# Patient Record
Sex: Female | Born: 1965 | Race: White | Hispanic: No | Smoking: Current every day smoker
Health system: Southern US, Community
[De-identification: ages and names within clinical notes are randomized; demographics above are authoritative.]

## PROBLEM LIST (undated history)

## (undated) ENCOUNTER — Emergency Department: Discharge status: Absent without leave | Payer: Medicaid Other

## (undated) DIAGNOSIS — I10 Essential (primary) hypertension: Secondary | ICD-10-CM

## (undated) DIAGNOSIS — Z720 Tobacco use: Secondary | ICD-10-CM

## (undated) DIAGNOSIS — M069 Rheumatoid arthritis, unspecified: Secondary | ICD-10-CM

## (undated) DIAGNOSIS — M1990 Unspecified osteoarthritis, unspecified site: Secondary | ICD-10-CM

## (undated) DIAGNOSIS — M359 Systemic involvement of connective tissue, unspecified: Secondary | ICD-10-CM

## (undated) DIAGNOSIS — IMO0001 Reserved for inherently not codable concepts without codable children: Secondary | ICD-10-CM

## (undated) DIAGNOSIS — Z794 Long term (current) use of insulin: Secondary | ICD-10-CM

## (undated) DIAGNOSIS — F1120 Opioid dependence, uncomplicated: Secondary | ICD-10-CM

## (undated) DIAGNOSIS — G629 Polyneuropathy, unspecified: Secondary | ICD-10-CM

## (undated) DIAGNOSIS — E1142 Type 2 diabetes mellitus with diabetic polyneuropathy: Secondary | ICD-10-CM

## (undated) DIAGNOSIS — G8929 Other chronic pain: Secondary | ICD-10-CM

## (undated) DIAGNOSIS — K760 Fatty (change of) liver, not elsewhere classified: Secondary | ICD-10-CM

## (undated) DIAGNOSIS — E119 Type 2 diabetes mellitus without complications: Secondary | ICD-10-CM

## (undated) HISTORY — PX: CHOLECYSTECTOMY: SHX55

## (undated) HISTORY — PX: KNEE SURGERY: SHX244

## (undated) HISTORY — PX: MANDIBLE FRACTURE SURGERY: SHX706

## (undated) HISTORY — PX: ABDOMINAL HYSTERECTOMY: SHX81

---

## 2012-01-04 ENCOUNTER — Emergency Department: Payer: Self-pay

## 2012-01-27 ENCOUNTER — Emergency Department: Payer: Self-pay | Admitting: Emergency Medicine

## 2012-03-15 ENCOUNTER — Emergency Department: Payer: Self-pay | Admitting: Emergency Medicine

## 2012-03-15 LAB — URINALYSIS, COMPLETE
Bilirubin,UR: NEGATIVE
Blood: NEGATIVE
Glucose,UR: >=500 mg/dL (ref 0–75)
Ketone: NEGATIVE
Leukocyte Esterase: NEGATIVE
Protein: NEGATIVE
RBC,UR: NONE SEEN /HPF (ref 0–5)
Specific Gravity: 1.029 (ref 1.003–1.030)
Squamous Epithelial: <1
WBC UR: 1 /HPF (ref 0–5)

## 2012-03-15 LAB — CBC WITH DIFFERENTIAL/PLATELET
Eosinophil #: 0.2 10*3/uL (ref 0.0–0.7)
Eosinophil %: 2.3 %
HCT: 41.5 % (ref 35.0–47.0)
HGB: 14 g/dL (ref 12.0–16.0)
Lymphocyte #: 3.9 10*3/uL — ABNORMAL HIGH (ref 1.0–3.6)
MCH: 28.5 pg (ref 26.0–34.0)
MCHC: 33.8 g/dL (ref 32.0–36.0)
MCV: 84 fL (ref 80–100)
Monocyte #: 0.5 x10 3/mm (ref 0.2–0.9)
Neutrophil #: 4.1 10*3/uL (ref 1.4–6.5)
Platelet: 158 10*3/uL (ref 150–440)
RDW: 12.9 % (ref 11.5–14.5)

## 2012-03-15 LAB — BASIC METABOLIC PANEL
BUN: 9 mg/dL (ref 7–18)
Calcium, Total: 8.8 mg/dL (ref 8.5–10.1)
Chloride: 101 mmol/L (ref 98–107)
Co2: 33 mmol/L — ABNORMAL HIGH (ref 21–32)
EGFR (African American): >60
Glucose: 374 mg/dL — ABNORMAL HIGH (ref 65–99)
Osmolality: 286 (ref 275–301)
Sodium: 136 mmol/L (ref 136–145)

## 2012-03-15 LAB — TROPONIN I: Troponin-I: <0.02 ng/mL

## 2012-03-15 LAB — CK TOTAL AND CKMB (NOT AT ARMC): CK, Total: 60 U/L (ref 21–215)

## 2012-07-11 ENCOUNTER — Emergency Department: Payer: Self-pay | Admitting: Emergency Medicine

## 2012-08-21 ENCOUNTER — Emergency Department: Payer: Self-pay | Admitting: Unknown Physician Specialty

## 2012-09-08 ENCOUNTER — Emergency Department: Payer: Self-pay | Admitting: Internal Medicine

## 2012-09-08 LAB — COMPREHENSIVE METABOLIC PANEL
Albumin: 3.5 g/dL (ref 3.4–5.0)
Alkaline Phosphatase: 98 U/L (ref 50–136)
Bilirubin,Total: 0.3 mg/dL (ref 0.2–1.0)
Calcium, Total: 8.8 mg/dL (ref 8.5–10.1)
Chloride: 100 mmol/L (ref 98–107)
EGFR (Non-African Amer.): >60
Glucose: 269 mg/dL — ABNORMAL HIGH (ref 65–99)
Osmolality: 281 (ref 275–301)
Potassium: 3.5 mmol/L (ref 3.5–5.1)
SGPT (ALT): 54 U/L (ref 12–78)

## 2012-09-08 LAB — CBC
HGB: 14.1 g/dL (ref 12.0–16.0)
MCH: 28.2 pg (ref 26.0–34.0)
MCHC: 33.1 g/dL (ref 32.0–36.0)
Platelet: 190 10*3/uL (ref 150–440)
WBC: 10 10*3/uL (ref 3.6–11.0)

## 2012-09-15 ENCOUNTER — Emergency Department: Payer: Self-pay | Admitting: Emergency Medicine

## 2012-09-24 ENCOUNTER — Emergency Department: Payer: Self-pay | Admitting: Emergency Medicine

## 2012-09-24 LAB — COMPREHENSIVE METABOLIC PANEL
Albumin: 3.4 g/dL (ref 3.4–5.0)
Alkaline Phosphatase: 120 U/L (ref 50–136)
Anion Gap: 9 (ref 7–16)
Bilirubin,Total: 0.3 mg/dL (ref 0.2–1.0)
Calcium, Total: 8.4 mg/dL — ABNORMAL LOW (ref 8.5–10.1)
Co2: 26 mmol/L (ref 21–32)
Creatinine: 0.62 mg/dL (ref 0.60–1.30)
EGFR (African American): >60
EGFR (Non-African Amer.): >60
Glucose: 338 mg/dL — ABNORMAL HIGH (ref 65–99)
SGOT(AST): 37 U/L (ref 15–37)
SGPT (ALT): 70 U/L (ref 12–78)
Sodium: 135 mmol/L — ABNORMAL LOW (ref 136–145)
Total Protein: 7.2 g/dL (ref 6.4–8.2)

## 2012-09-24 LAB — CBC
HCT: 40.8 % (ref 35.0–47.0)
MCHC: 33 g/dL (ref 32.0–36.0)
MCV: 85 fL (ref 80–100)
WBC: 7.5 10*3/uL (ref 3.6–11.0)

## 2012-09-24 LAB — URINALYSIS, COMPLETE
Bacteria: NONE SEEN
Bilirubin,UR: NEGATIVE
Glucose,UR: >=500 mg/dL (ref 0–75)
Ketone: NEGATIVE
Ph: 5 (ref 4.5–8.0)
Protein: NEGATIVE
RBC,UR: <1 /HPF (ref 0–5)
Specific Gravity: 1.032 (ref 1.003–1.030)
WBC UR: <1 /HPF (ref 0–5)

## 2012-09-24 LAB — CK TOTAL AND CKMB (NOT AT ARMC): CK, Total: 59 U/L (ref 21–215)

## 2012-09-24 LAB — TROPONIN I: Troponin-I: <0.02 ng/mL

## 2012-10-13 ENCOUNTER — Emergency Department: Payer: Self-pay | Admitting: Emergency Medicine

## 2012-10-13 LAB — COMPREHENSIVE METABOLIC PANEL
Albumin: 3.5 g/dL (ref 3.4–5.0)
BUN: 9 mg/dL (ref 7–18)
Co2: 33 mmol/L — ABNORMAL HIGH (ref 21–32)
Creatinine: 0.44 mg/dL — ABNORMAL LOW (ref 0.60–1.30)
EGFR (Non-African Amer.): >60
Glucose: 242 mg/dL — ABNORMAL HIGH (ref 65–99)
Osmolality: 286 (ref 275–301)
SGOT(AST): 39 U/L — ABNORMAL HIGH (ref 15–37)
SGPT (ALT): 68 U/L (ref 12–78)

## 2012-10-13 LAB — URINALYSIS, COMPLETE
Glucose,UR: >=500 mg/dL (ref 0–75)
Ketone: NEGATIVE
Nitrite: NEGATIVE
Protein: NEGATIVE
RBC,UR: <1 /HPF (ref 0–5)
Specific Gravity: 1.026 (ref 1.003–1.030)
Squamous Epithelial: <1
WBC UR: <1 /HPF (ref 0–5)

## 2012-10-13 LAB — CBC
HCT: 39.7 % (ref 35.0–47.0)
HGB: 13.2 g/dL (ref 12.0–16.0)
Platelet: 159 10*3/uL (ref 150–440)
RDW: 14 % (ref 11.5–14.5)
WBC: 8.1 10*3/uL (ref 3.6–11.0)

## 2013-10-08 ENCOUNTER — Emergency Department: Payer: Self-pay | Admitting: Emergency Medicine

## 2013-10-14 ENCOUNTER — Emergency Department: Payer: Self-pay | Admitting: Emergency Medicine

## 2013-10-14 LAB — URINALYSIS, COMPLETE
BACTERIA: NONE SEEN
BILIRUBIN, UR: NEGATIVE
Blood: NEGATIVE
KETONE: NEGATIVE
Leukocyte Esterase: NEGATIVE
NITRITE: NEGATIVE
PH: 5 (ref 4.5–8.0)
Protein: NEGATIVE
RBC,UR: <1 /HPF (ref 0–5)
SPECIFIC GRAVITY: 1.011 (ref 1.003–1.030)
Squamous Epithelial: NONE SEEN
WBC UR: 1 /HPF (ref 0–5)

## 2014-03-05 ENCOUNTER — Emergency Department: Payer: Self-pay | Admitting: Internal Medicine

## 2014-03-05 LAB — URINALYSIS, COMPLETE
BACTERIA: NONE SEEN
Bilirubin,UR: NEGATIVE
Blood: NEGATIVE
Glucose,UR: >=500 mg/dL (ref 0–75)
LEUKOCYTE ESTERASE: NEGATIVE
NITRITE: NEGATIVE
PH: 6 (ref 4.5–8.0)
PROTEIN: NEGATIVE
RBC,UR: <1 /HPF (ref 0–5)
Specific Gravity: 1.031 (ref 1.003–1.030)
Squamous Epithelial: <1
WBC UR: <1 /HPF (ref 0–5)

## 2014-03-05 LAB — COMPREHENSIVE METABOLIC PANEL
ALK PHOS: 88 U/L
ALT: 40 U/L
Albumin: 3.1 g/dL — ABNORMAL LOW (ref 3.4–5.0)
Anion Gap: 14 (ref 7–16)
BUN: 23 mg/dL — AB (ref 7–18)
Bilirubin,Total: 0.9 mg/dL (ref 0.2–1.0)
Calcium, Total: 8.2 mg/dL — ABNORMAL LOW (ref 8.5–10.1)
Chloride: 96 mmol/L — ABNORMAL LOW (ref 98–107)
Co2: 22 mmol/L (ref 21–32)
Creatinine: 0.74 mg/dL (ref 0.60–1.30)
EGFR (African American): >60
EGFR (Non-African Amer.): >60
Glucose: 419 mg/dL — ABNORMAL HIGH (ref 65–99)
Osmolality: 286 (ref 275–301)
POTASSIUM: 3.8 mmol/L (ref 3.5–5.1)
SGOT(AST): 30 U/L (ref 15–37)
SODIUM: 132 mmol/L — AB (ref 136–145)
Total Protein: 6.9 g/dL (ref 6.4–8.2)

## 2014-03-05 LAB — CBC WITH DIFFERENTIAL/PLATELET
Basophil #: 0 10*3/uL (ref 0.0–0.1)
Basophil %: 0.3 %
Eosinophil #: 0 10*3/uL (ref 0.0–0.7)
Eosinophil %: 0 %
HCT: 49.9 % — ABNORMAL HIGH (ref 35.0–47.0)
HGB: 16.1 g/dL — ABNORMAL HIGH (ref 12.0–16.0)
LYMPHS ABS: 1.3 10*3/uL (ref 1.0–3.6)
LYMPHS PCT: 12.3 %
MCH: 28.7 pg (ref 26.0–34.0)
MCHC: 32.3 g/dL (ref 32.0–36.0)
MCV: 89 fL (ref 80–100)
Monocyte #: 0.5 x10 3/mm (ref 0.2–0.9)
Monocyte %: 5.2 %
NEUTROS ABS: 8.3 10*3/uL — AB (ref 1.4–6.5)
Neutrophil %: 82.2 %
Platelet: 216 10*3/uL (ref 150–440)
RBC: 5.63 10*6/uL — AB (ref 3.80–5.20)
RDW: 13.3 % (ref 11.5–14.5)
WBC: 10.1 10*3/uL (ref 3.6–11.0)

## 2014-03-05 LAB — TROPONIN I: Troponin-I: <0.02 ng/mL

## 2014-03-05 LAB — LIPASE, BLOOD: LIPASE: 75 U/L (ref 73–393)

## 2014-03-29 ENCOUNTER — Emergency Department: Payer: Self-pay | Admitting: Emergency Medicine

## 2014-03-29 LAB — CBC
HCT: 41 % (ref 35.0–47.0)
HGB: 13 g/dL (ref 12.0–16.0)
MCH: 28.1 pg (ref 26.0–34.0)
MCHC: 31.8 g/dL — ABNORMAL LOW (ref 32.0–36.0)
MCV: 88 fL (ref 80–100)
Platelet: 171 10*3/uL (ref 150–440)
RBC: 4.64 10*6/uL (ref 3.80–5.20)
RDW: 13 % (ref 11.5–14.5)
WBC: 7.6 10*3/uL (ref 3.6–11.0)

## 2014-03-29 LAB — COMPREHENSIVE METABOLIC PANEL
Albumin: 3.2 g/dL — ABNORMAL LOW (ref 3.4–5.0)
Alkaline Phosphatase: 82 U/L
Anion Gap: 8 (ref 7–16)
BILIRUBIN TOTAL: 0.3 mg/dL (ref 0.2–1.0)
BUN: 8 mg/dL (ref 7–18)
CALCIUM: 8.5 mg/dL (ref 8.5–10.1)
CHLORIDE: 106 mmol/L (ref 98–107)
CO2: 28 mmol/L (ref 21–32)
Creatinine: 0.66 mg/dL (ref 0.60–1.30)
EGFR (African American): >60
EGFR (Non-African Amer.): >60
GLUCOSE: 350 mg/dL — AB (ref 65–99)
OSMOLALITY: 295 (ref 275–301)
Potassium: 3.4 mmol/L — ABNORMAL LOW (ref 3.5–5.1)
SGOT(AST): 29 U/L (ref 15–37)
SGPT (ALT): 32 U/L
Sodium: 142 mmol/L (ref 136–145)
TOTAL PROTEIN: 6.5 g/dL (ref 6.4–8.2)

## 2014-03-29 LAB — URINALYSIS, COMPLETE
BACTERIA: NONE SEEN
Bilirubin,UR: NEGATIVE
Blood: NEGATIVE
Glucose,UR: >=500 mg/dL (ref 0–75)
KETONE: NEGATIVE
NITRITE: NEGATIVE
PROTEIN: NEGATIVE
Ph: 6 (ref 4.5–8.0)
SPECIFIC GRAVITY: 1.012 (ref 1.003–1.030)

## 2014-03-29 LAB — TROPONIN I: Troponin-I: <0.02 ng/mL

## 2014-03-29 LAB — HEMOGLOBIN A1C: HEMOGLOBIN A1C: 10.9 % — AB (ref 4.2–6.3)

## 2014-03-29 LAB — MAGNESIUM: Magnesium: 1.6 mg/dL — ABNORMAL LOW

## 2014-03-29 LAB — LIPASE, BLOOD: Lipase: 124 U/L (ref 73–393)

## 2014-04-23 ENCOUNTER — Emergency Department: Payer: Self-pay | Admitting: Emergency Medicine

## 2015-02-09 ENCOUNTER — Encounter: Payer: Self-pay | Admitting: *Deleted

## 2015-02-09 ENCOUNTER — Emergency Department
Admission: EM | Admit: 2015-02-09 | Discharge: 2015-02-09 | Disposition: A | Discharge status: Home / Self Care | Payer: Self-pay | Attending: Emergency Medicine | Admitting: Emergency Medicine

## 2015-02-09 DIAGNOSIS — Z720 Tobacco use: Secondary | ICD-10-CM | POA: Insufficient documentation

## 2015-02-09 DIAGNOSIS — Z79891 Long term (current) use of opiate analgesic: Secondary | ICD-10-CM | POA: Insufficient documentation

## 2015-02-09 DIAGNOSIS — Z79899 Other long term (current) drug therapy: Secondary | ICD-10-CM | POA: Insufficient documentation

## 2015-02-09 DIAGNOSIS — E119 Type 2 diabetes mellitus without complications: Secondary | ICD-10-CM | POA: Insufficient documentation

## 2015-02-09 DIAGNOSIS — Z794 Long term (current) use of insulin: Secondary | ICD-10-CM | POA: Insufficient documentation

## 2015-02-09 DIAGNOSIS — R252 Cramp and spasm: Secondary | ICD-10-CM | POA: Insufficient documentation

## 2015-02-09 HISTORY — DX: Unspecified osteoarthritis, unspecified site: M19.90

## 2015-02-09 HISTORY — DX: Polyneuropathy, unspecified: G62.9

## 2015-02-09 LAB — BASIC METABOLIC PANEL
ANION GAP: 8 (ref 5–15)
BUN: 15 mg/dL (ref 6–20)
CHLORIDE: 101 mmol/L (ref 101–111)
CO2: 27 mmol/L (ref 22–32)
CREATININE: 0.54 mg/dL (ref 0.44–1.00)
Calcium: 9 mg/dL (ref 8.9–10.3)
GFR calc Af Amer: >60 mL/min (ref >60)
GFR calc non Af Amer: >60 mL/min (ref >60)
Glucose, Bld: 404 mg/dL — ABNORMAL HIGH (ref 65–99)
Potassium: 3.6 mmol/L (ref 3.5–5.1)
Sodium: 136 mmol/L (ref 135–145)

## 2015-02-09 LAB — CK: Total CK: 79 U/L (ref 38–234)

## 2015-02-09 MED ORDER — CYCLOBENZAPRINE HCL 10 MG PO TABS: 10.0000 mg | ORAL_TABLET | Freq: Three times a day (TID) | ORAL | Status: DC | PRN

## 2015-02-09 NOTE — ED Notes (Signed)
Pt reports throbbing and cramping in bilateral legs "off and on" for about 2.5 weeks.

## 2015-02-09 NOTE — ED Notes (Signed)
Patient is supposed to be taking lisinopril, gabapentin and venlafaxine but has been out of them for last few weeks.

## 2015-02-09 NOTE — ED Provider Notes (Signed)
[facility] Emergency Department Provider Note  ____________________________________________  Time seen: On arrival  I have reviewed the triage vital signs and the nursing notes.   HISTORY  Chief Complaint Leg Pain    HPI Karen Lindsey is a 49 y.o. female who presents with multiple complaints. Chief among them is cramping in her bilateral legs. She reports a long history of cramping in her legs but it is never been as bad as it is today. She does not know why this happens. No injury. No fevers no chills. No rash. No recent travel. No history of blood clots.    Past Medical History  Diagnosis Date  . Diabetes mellitus without complication   . Neuropathy   . Arthritis     rheumatoid    There are no active problems to display for this patient.   Past Surgical History  Procedure Laterality Date  . Knee surgery    . Abdominal hysterectomy    . Cesarean section    . Mandible fracture surgery    . Cholecystectomy      Current Outpatient Rx  Name  Route  Sig  Dispense  Refill  . insulin aspart protamine- aspart (NOVOLOG MIX 70/30) (70-30) 100 UNIT/ML injection   Subcutaneous   Inject into the skin.         Marland Kitchen insulin glargine (LANTUS) 100 UNIT/ML injection   Subcutaneous   Inject into the skin at bedtime.         . metFORMIN (GLUCOPHAGE) 1000 MG tablet   Oral   Take 500 mg by mouth 2 (two) times daily with a meal.         . methadone (DOLOPHINE) 5 MG/5ML solution   Oral   Take 130 mg by mouth daily.           Allergies Ultram  No family history on file.  Social History History  Substance Use Topics  . Smoking status: Current Every Day Smoker  . Smokeless tobacco: Not on file  . Alcohol Use: No    Review of Systems  Constitutional: Negative for fever. Eyes: Negative for visual changes. ENT: Negative for sore throat   Genitourinary: Negative for dysuria. Musculoskeletal: Negative for back pain. Skin: Negative for  rash. Neurological: Negative for headaches or focal weakness   ____________________________________________   PHYSICAL EXAM:  VITAL SIGNS: ED Triage Vitals  Enc Vitals Group     BP 02/09/15 1655 157/86 mmHg     Pulse Rate 02/09/15 1655 76     Resp 02/09/15 1655 18     Temp 02/09/15 1655 98.3 F (36.8 C)     Temp Source 02/09/15 1655 Oral     SpO2 02/09/15 1655 95 %     Weight 02/09/15 1655 145 lb (65.772 kg)     Height 02/09/15 1655 5\' 1"  (1.549 m)     Head Cir --      Peak Flow --      Pain Score 02/09/15 1655 6     Pain Loc --      Pain Edu? --      Excl. in GC? --      Constitutional: Alert and oriented. Well appearing and in no distress. Eyes: Conjunctivae are normal.  ENT   Head: Normocephalic and atraumatic.   Mouth/Throat: Mucous membranes are moist. Cardiovascular: Normal rate, regular rhythm.  Respiratory: Normal respiratory effort without tachypnea nor retractions.  Gastrointestinal: Soft and non-tender in all quadrants. No distention. There is no CVA  tenderness. Musculoskeletal: Nontender with normal range of motion in all extremities. Compartments are soft, no rash Neurologic:  Normal speech and language. No gross focal neurologic deficits are appreciated. Skin:  Skin is warm, dry and intact. No rash noted. Psychiatric: Mood and affect are normal. Patient exhibits appropriate insight and judgment.  ____________________________________________    LABS (pertinent positives/negatives)  Labs Reviewed  BASIC METABOLIC PANEL  CK    ____________________________________________     ____________________________________________    RADIOLOGY I have personally reviewed any xrays that were ordered on this patient: None  ____________________________________________   PROCEDURES  Procedure(s) performed: none   ____________________________________________   INITIAL IMPRESSION / ASSESSMENT AND PLAN / ED COURSE  Pertinent labs & imaging  results that were available during my care of the patient were reviewed by me and considered in my medical decision making (see chart for details).  Patient well-appearing no acute distress. Significant anxiety. We will check CK and electrolytes and if normal will discharge with a muscle relaxer. Emphasized patient to take her insulin and to recheck her sugar  ____________________________________________   FINAL CLINICAL IMPRESSION(S) / ED DIAGNOSES  Final diagnoses:  Muscle cramping     Jene Every, MD 02/09/15 1911

## 2015-02-09 NOTE — Discharge Instructions (Signed)

## 2015-02-09 NOTE — ED Notes (Signed)
Patient went roller skating about one month ago and fell. States both legs hurt. States legs began cramping since yesterday. Patient has mulitple complaints from choking on chicken bone from [employer] to moving to having to stand at her job. C/O neuropathy, having been kicked out of her home and financial worries.

## 2015-03-12 ENCOUNTER — Emergency Department
Admission: EM | Admit: 2015-03-12 | Discharge: 2015-03-12 | Disposition: A | Discharge status: Home / Self Care | Payer: Self-pay | Attending: Emergency Medicine | Admitting: Emergency Medicine

## 2015-03-12 DIAGNOSIS — R1011 Right upper quadrant pain: Secondary | ICD-10-CM | POA: Insufficient documentation

## 2015-03-12 DIAGNOSIS — Z720 Tobacco use: Secondary | ICD-10-CM | POA: Insufficient documentation

## 2015-03-12 DIAGNOSIS — E1165 Type 2 diabetes mellitus with hyperglycemia: Secondary | ICD-10-CM | POA: Insufficient documentation

## 2015-03-12 DIAGNOSIS — F419 Anxiety disorder, unspecified: Secondary | ICD-10-CM | POA: Insufficient documentation

## 2015-03-12 DIAGNOSIS — Z79899 Other long term (current) drug therapy: Secondary | ICD-10-CM | POA: Insufficient documentation

## 2015-03-12 DIAGNOSIS — Z794 Long term (current) use of insulin: Secondary | ICD-10-CM | POA: Insufficient documentation

## 2015-03-12 DIAGNOSIS — Z79891 Long term (current) use of opiate analgesic: Secondary | ICD-10-CM | POA: Insufficient documentation

## 2015-03-12 DIAGNOSIS — Z8719 Personal history of other diseases of the digestive system: Secondary | ICD-10-CM | POA: Insufficient documentation

## 2015-03-12 DIAGNOSIS — F439 Reaction to severe stress, unspecified: Secondary | ICD-10-CM | POA: Insufficient documentation

## 2015-03-12 LAB — CBC WITH DIFFERENTIAL/PLATELET
BASOS ABS: 0.1 10*3/uL (ref 0–0.1)
BASOS PCT: 1 %
Eosinophils Absolute: 0.1 10*3/uL (ref 0–0.7)
Eosinophils Relative: 2 %
HCT: 45.6 % (ref 35.0–47.0)
Hemoglobin: 15 g/dL (ref 12.0–16.0)
Lymphocytes Relative: 28 %
Lymphs Abs: 2.6 10*3/uL (ref 1.0–3.6)
MCH: 28.2 pg (ref 26.0–34.0)
MCHC: 32.9 g/dL (ref 32.0–36.0)
MCV: 85.7 fL (ref 80.0–100.0)
MONO ABS: 0.4 10*3/uL (ref 0.2–0.9)
Monocytes Relative: 4 %
NEUTROS ABS: 6 10*3/uL (ref 1.4–6.5)
NEUTROS PCT: 65 %
PLATELETS: 163 10*3/uL (ref 150–440)
RBC: 5.32 MIL/uL — ABNORMAL HIGH (ref 3.80–5.20)
RDW: 13.1 % (ref 11.5–14.5)
WBC: 9.1 10*3/uL (ref 3.6–11.0)

## 2015-03-12 LAB — COMPREHENSIVE METABOLIC PANEL
ALT: 42 U/L (ref 14–54)
ANION GAP: 10 (ref 5–15)
AST: 29 U/L (ref 15–41)
Albumin: 4.1 g/dL (ref 3.5–5.0)
Alkaline Phosphatase: 117 U/L (ref 38–126)
BILIRUBIN TOTAL: 0.8 mg/dL (ref 0.3–1.2)
BUN: 12 mg/dL (ref 6–20)
CALCIUM: 9.2 mg/dL (ref 8.9–10.3)
CO2: 27 mmol/L (ref 22–32)
CREATININE: 0.65 mg/dL (ref 0.44–1.00)
Chloride: 96 mmol/L — ABNORMAL LOW (ref 101–111)
GFR calc non Af Amer: >60 mL/min (ref >60)
Glucose, Bld: 522 mg/dL (ref 65–99)
Potassium: 4.4 mmol/L (ref 3.5–5.1)
SODIUM: 133 mmol/L — AB (ref 135–145)
TOTAL PROTEIN: 7.5 g/dL (ref 6.5–8.1)

## 2015-03-12 LAB — LIPASE, BLOOD: Lipase: 40 U/L (ref 22–51)

## 2015-03-12 LAB — URINALYSIS COMPLETE WITH MICROSCOPIC (ARMC ONLY)
BILIRUBIN URINE: NEGATIVE
Bacteria, UA: NONE SEEN
Glucose, UA: >500 mg/dL — AB
Hgb urine dipstick: NEGATIVE
Ketones, ur: NEGATIVE mg/dL
Leukocytes, UA: NEGATIVE
Nitrite: NEGATIVE
PH: 5 (ref 5.0–8.0)
PROTEIN: NEGATIVE mg/dL
Specific Gravity, Urine: 1.038 — ABNORMAL HIGH (ref 1.005–1.030)

## 2015-03-12 MED ORDER — INSULIN ASPART PROT & ASPART (70-30 MIX) 100 UNIT/ML ~~LOC~~ SUSP
15.0000 [IU] | Freq: Once | SUBCUTANEOUS | Status: AC
  Administered 2015-03-12: 15 [IU] via SUBCUTANEOUS
  Filled 2015-03-12: qty 15
  Filled 2015-03-12: qty 10

## 2015-03-12 MED ORDER — LISINOPRIL 10 MG PO TABS: 10.0000 mg | ORAL_TABLET | Freq: Every day | ORAL | Status: DC

## 2015-03-12 MED ORDER — SODIUM CHLORIDE 0.9 % IV BOLUS (SEPSIS)
1000.0000 mL | Freq: Once | INTRAVENOUS | Status: AC
  Administered 2015-03-12: 1000 mL via INTRAVENOUS

## 2015-03-12 MED ORDER — ALPRAZOLAM 0.5 MG PO TABS: 0.5000 mg | ORAL_TABLET | Freq: Three times a day (TID) | ORAL | Status: DC | PRN

## 2015-03-12 MED ORDER — LORAZEPAM 0.5 MG PO TABS
0.5000 mg | ORAL_TABLET | Freq: Once | ORAL | Status: AC
  Administered 2015-03-12: 0.5 mg via ORAL
  Filled 2015-03-12: qty 1

## 2015-03-12 MED ORDER — LORAZEPAM 0.5 MG PO TABS: 0.5000 mg | ORAL_TABLET | Freq: Three times a day (TID) | ORAL | Status: DC | PRN

## 2015-03-12 NOTE — ED Provider Notes (Signed)
[facility] Emergency Department Provider Note  ____________________________________________  Time seen: 1720  I have reviewed the triage vital signs and the nursing notes.   HISTORY  Chief Complaint Anxiety and Emesis     HPI Karen Lindsey is a 49 y.o. female who reports multiple stressors in her life currently area she outlines how her father has recently been placed in hospice and "we will lightly not make it". 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 She is not sleeping well. She reports feeling nauseous and having some emesis. She has a lack of local support, [place]  The patient does report that she has a history of some liver problems. Sometimes she develops pain in her right abdomen.  She would like to be medically evaluated for the anxiety, distress, abdominal pain, and nausea that she has been undergoing.    Past Medical History  Diagnosis Date  . Diabetes mellitus without complication   . Neuropathy   . Arthritis     rheumatoid    There are no active problems to display for this patient.   Past Surgical History  Procedure Laterality Date  . Knee surgery    . Abdominal hysterectomy    . Cesarean section    . Mandible fracture surgery    . Cholecystectomy      Current Outpatient Rx  Name  Route  Sig  Dispense  Refill  . ALPRAZolam (XANAX) 0.5 MG tablet   Oral   Take 1 tablet (0.5 mg total) by mouth 3 (three) times daily as needed for sleep or anxiety.   15 tablet   0   . cyclobenzaprine (FLEXERIL) 10 MG tablet   Oral   Take 1 tablet (10 mg total) by mouth every 8 (eight) hours as needed for muscle spasms.   20 tablet   1   . insulin aspart protamine- aspart (NOVOLOG MIX 70/30) (70-30) 100 UNIT/ML injection   Subcutaneous   Inject into the skin.         Marland Kitchen insulin glargine (LANTUS) 100 UNIT/ML injection   Subcutaneous   Inject into the skin  at bedtime.         Marland Kitchen lisinopril (PRINIVIL,ZESTRIL) 10 MG tablet   Oral   Take 1 tablet (10 mg total) by mouth daily.   30 tablet   0   . metFORMIN (GLUCOPHAGE) 1000 MG tablet   Oral   Take 500 mg by mouth 2 (two) times daily with a meal.         . methadone (DOLOPHINE) 5 MG/5ML solution   Oral   Take 130 mg by mouth daily.           Allergies Ultram  No family history on file.  Social History Social History  Substance Use Topics  . Smoking status: Current Every Day Smoker  . Smokeless tobacco: Not on file  . Alcohol Use: No    Review of Systems  Constitutional: Negative for fever. ENT: Negative for sore throat. Cardiovascular: Negative for chest pain. Respiratory: Negative for shortness of breath. Gastrointestinal: History of enlarged liver. Recent right abdominal pain with some nausea. Genitourinary: Negative for dysuria. Musculoskeletal: No myalgias or injuries. Skin: Negative for rash. Neurological: Negative for headaches Psychological: Multiple stressors with anxiety recently. See history of present illness  10-point ROS otherwise negative.  ____________________________________________   PHYSICAL EXAM:  VITAL SIGNS: ED Triage Vitals  Enc Vitals Group     BP 03/12/15 1705 158/85 mmHg     Pulse Rate 03/12/15 1521 68     Resp 03/12/15 1521 18     Temp 03/12/15 1521 98.6 F (37 C)     Temp Source 03/12/15 1521 Oral     SpO2 03/12/15 1521 95 %     Weight 03/12/15 1521 140 lb (63.504 kg)     Height 03/12/15 1521 5\' 1"  (1.549 m)     Head Cir --      Peak Flow --      Pain Score 03/12/15 1522 6     Pain Loc --      Pain Edu? --      Excl. in GC? --     Constitutional: Alert and oriented. Patient appears to be somewhat anxious. She is somewhat hyperverbal. She is in no acute distress.Marland Kitchen ENT   Head: Normocephalic and atraumatic.   Nose: No congestion/rhinnorhea.   Mouth/Throat: Mucous membranes are moist. Cardiovascular: Normal  rate, regular rhythm, no murmur noted Respiratory:  Normal respiratory effort, no tachypnea.    Breath sounds are clear and equal bilaterally.  Gastrointestinal: Soft, mild tenderness in the right upper quadrant. Normal bowel sounds. Back: No muscle spasm, no tenderness, no CVA tenderness. Musculoskeletal: No deformity noted. Nontender with normal range of motion in all extremities.  No noted edema. Neurologic:  Normal speech and language. No gross focal neurologic deficits are appreciated.  Skin:  Skin is warm, dry. No rash noted. Psychiatric: Patient goes into great detail about the multiple stressors in her life. As noted in history of present illness, her father is in hospice, she has a stressful job, she has a lack of local support. She has an intact thought process. She denies any suicidal thoughts or thoughts of hurting others..  ____________________________________________    LABS (pertinent positives/negatives)  Labs Reviewed  COMPREHENSIVE METABOLIC PANEL - Abnormal; Notable for the following:    Sodium 133 (*)    Chloride 96 (*)    Glucose, Bld 522 (*)    All other components within normal limits  CBC WITH DIFFERENTIAL/PLATELET - Abnormal; Notable for the following:    RBC 5.32 (*)    All other components within normal limits  URINALYSIS COMPLETEWITH MICROSCOPIC ([facility] ONLY) - Abnormal; Notable for the following:    Color, Urine STRAW (*)    APPearance CLEAR (*)    Glucose, UA >500 (*)    Specific Gravity, Urine 1.038 (*)    Squamous Epithelial / LPF 0-5 (*)    All other components within normal limits  LIPASE, BLOOD   ____________________________________________   INITIAL IMPRESSION / ASSESSMENT AND PLAN / ED COURSE  Pertinent labs & imaging results that were available during my care of the patient were reviewed by me and considered in my medical decision making (see chart for details).  This patient has multiple stressors. She implies that she thinks anxiety is  causing her to feel nauseous and vomit. She liked to be medically evaluated. I think this sounds quite reasonable. Due to the right-sided abdominal pain and general malaise, we are checking blood tests, including liver enzymes, electrolytes, kidney function, and blood count. We'll check urinalysis as well.  ----------------------------------------- 7:04 PM on 03/12/2015 -----------------------------------------  Blood count is reasonable with a normal white blood cell count and hemoglobin 15. Renal function is good, however glucose is elevated at 522.  Further discussion with the patient about her diabetes medication regimen finds that she is  erratic about taking her medication. She usually takes her Lantus at around 1 AM. At that time she also takes a dose of 7030. I raised the question of why she would be taking 7030 before bed with her and I think encouraged her to speak with the doctor she has seen previously for further guidance. I think would be more reasonable for her to take 7030 during the day and rely on the Lantus at night for long-term stabilization. We've also discussed the option of her taking Lantus at a lower dose twice a day. The patient says she used to do this. She seems to have her own personal preference for how she takes her insulin.  We will treat her with 1 L of normal saline and 70/30 at the moment.  ----------------------------------------- 8:43 PM on 03/12/2015 -----------------------------------------  The patient's blood sugar has come down to 293. She overall feels better. We have discussed the importance of follow-up both for the hyperglycemia as well as for her anxiety. I initially was prescribing Xanax for her, but she prefers Ativan. She has also asked that I knew her lisinopril. I have written a prescription for both of these medications.   ____________________________________________   FINAL CLINICAL IMPRESSION(S) / ED DIAGNOSES  Final diagnoses:  Right  upper quadrant pain  Anxiety  Hyperglycemia due to type 2 diabetes mellitus      Darien Ramus, MD 03/12/15 2044

## 2015-03-12 NOTE — Discharge Instructions (Signed)
Your blood tests appeared okay. You are under a great deal of stress. Take Xanax as needed. Follow-up with your regular doctor or at [facility] clinic for medical problems. Follow-up with a therapist or psychologist or psychiatrist. He may go to [facility] for additional assistance. Return to the emergency department if you have worsening abdominal pain, nausea vomiting diarrhea, or if you have increased agitation or concerns from the multiple stressors you are experiencing and had thoughts of self-harm or other urgent concerns.

## 2015-03-12 NOTE — ED Notes (Signed)
Pt verbalizes understanding and denies questions at time of d/c.  No signature pad available in pts room to sign d/c.

## 2015-03-12 NOTE — ED Notes (Signed)
Insulin requested from pharmacy.

## 2015-03-12 NOTE — ED Notes (Signed)
Patient presents to the ED with anxiety and nausea.  Patient states she learned that her father is dying and is in hospice four days ago and patient states that since then she has had no appetite and had anxiety.  Patient reports vomiting x 1 yesterday.  Denies vomiting today.  Denies abdominal pain.

## 2015-03-12 NOTE — ED Notes (Signed)
PTs Fingerstick BGL - 293

## 2015-03-12 NOTE — ED Notes (Signed)
Patient states "I haven't been able to sleep or eat well the past couple days.  I have a lot going on at home with my personal life".  Patient denies the use of any anxiety meds at homes and denies SI or HI.

## 2015-03-14 LAB — GLUCOSE, CAPILLARY: Glucose-Capillary: 293 mg/dL — ABNORMAL HIGH (ref 65–99)

## 2015-03-16 ENCOUNTER — Emergency Department: Payer: Self-pay

## 2015-03-16 DIAGNOSIS — Z79891 Long term (current) use of opiate analgesic: Secondary | ICD-10-CM | POA: Insufficient documentation

## 2015-03-16 DIAGNOSIS — Z79899 Other long term (current) drug therapy: Secondary | ICD-10-CM | POA: Insufficient documentation

## 2015-03-16 DIAGNOSIS — Z720 Tobacco use: Secondary | ICD-10-CM | POA: Insufficient documentation

## 2015-03-16 DIAGNOSIS — E119 Type 2 diabetes mellitus without complications: Secondary | ICD-10-CM | POA: Insufficient documentation

## 2015-03-16 DIAGNOSIS — L03113 Cellulitis of right upper limb: Secondary | ICD-10-CM | POA: Insufficient documentation

## 2015-03-16 DIAGNOSIS — Z794 Long term (current) use of insulin: Secondary | ICD-10-CM | POA: Insufficient documentation

## 2015-03-16 NOTE — ED Notes (Signed)
Pt states that she fell asleep on the side of the bed last night and fell into the floor, pt states that she slept that way all night and this am woke up with redness and pain to the right hand, pt states that it has been painful all day

## 2015-03-17 ENCOUNTER — Emergency Department
Admission: EM | Admit: 2015-03-17 | Discharge: 2015-03-17 | Disposition: A | Discharge status: Home / Self Care | Payer: Self-pay | Attending: Emergency Medicine | Admitting: Emergency Medicine

## 2015-03-17 DIAGNOSIS — L03113 Cellulitis of right upper limb: Secondary | ICD-10-CM

## 2015-03-17 MED ORDER — KETOROLAC TROMETHAMINE 10 MG PO TABS
10.0000 mg | ORAL_TABLET | Freq: Once | ORAL | Status: AC
  Administered 2015-03-17: 10 mg via ORAL
  Filled 2015-03-17: qty 1

## 2015-03-17 MED ORDER — KETOROLAC TROMETHAMINE 10 MG PO TABS: 10.0000 mg | ORAL_TABLET | Freq: Three times a day (TID) | ORAL | Status: DC | PRN

## 2015-03-17 MED ORDER — CEPHALEXIN 500 MG PO CAPS: 500.0000 mg | ORAL_CAPSULE | Freq: Two times a day (BID) | ORAL | Status: AC

## 2015-03-17 MED ORDER — CEPHALEXIN 500 MG PO CAPS
500.0000 mg | ORAL_CAPSULE | Freq: Once | ORAL | Status: AC
  Administered 2015-03-17: 500 mg via ORAL
  Filled 2015-03-17: qty 1

## 2015-03-17 NOTE — ED Notes (Signed)
Patient presents to ED with c/o swelling and redness to right hand since yesterday morning at 5:00 am. Patient reports she was sitting on the side of the bed, fell asleep and woke up on the floor with her right hand on top of the bed. Patient denies any known injury to right hand. Redness and swelling noted to right hand beginning at fingertips to mid forearm. Patient reports tingling and numbness, states unable to make a fist due to increased pain. Right hand cool to touch, + radial pulse, capillary refill WNL. Patient alert and oriented x 4, respirations even and unlabored. NAD noted at this time.

## 2015-03-17 NOTE — Discharge Instructions (Signed)

## 2015-03-17 NOTE — ED Notes (Signed)

## 2015-03-17 NOTE — ED Provider Notes (Signed)
[facility] Emergency Department Provider Note  ____________________________________________  Time seen: 2:00 AM  I have reviewed the triage vital signs and the nursing notes.   HISTORY  Chief Complaint Hand Pain      HPI Karen Lindsey is a 49 y.o. female presents with nontraumatic right hand pain and swelling and redness first noted one day ago. Patient states that she fell asleep and woke up with her hand swollen painful with erythema. Current pain score 6 out of 10     Past Medical History  Diagnosis Date  . Diabetes mellitus without complication   . Neuropathy   . Arthritis     rheumatoid    There are no active problems to display for this patient.   Past Surgical History  Procedure Laterality Date  . Knee surgery    . Abdominal hysterectomy    . Cesarean section    . Mandible fracture surgery    . Cholecystectomy      Current Outpatient Rx  Name  Route  Sig  Dispense  Refill  . cyclobenzaprine (FLEXERIL) 10 MG tablet   Oral   Take 1 tablet (10 mg total) by mouth every 8 (eight) hours as needed for muscle spasms.   20 tablet   1   . insulin aspart protamine- aspart (NOVOLOG MIX 70/30) (70-30) 100 UNIT/ML injection   Subcutaneous   Inject into the skin.         Marland Kitchen insulin glargine (LANTUS) 100 UNIT/ML injection   Subcutaneous   Inject into the skin at bedtime.         Marland Kitchen lisinopril (PRINIVIL,ZESTRIL) 10 MG tablet   Oral   Take 1 tablet (10 mg total) by mouth daily.   30 tablet   0   . LORazepam (ATIVAN) 0.5 MG tablet   Oral   Take 1 tablet (0.5 mg total) by mouth every 8 (eight) hours as needed for anxiety.   10 tablet   0   . metFORMIN (GLUCOPHAGE) 1000 MG tablet   Oral   Take 500 mg by mouth 2 (two) times daily with a meal.         . methadone (DOLOPHINE) 5 MG/5ML solution   Oral   Take 130 mg by mouth daily.           Allergies Ultram  No family history on file.  Social History Social History   Substance Use Topics  . Smoking status: Current Every Day Smoker  . Smokeless tobacco: Not on file  . Alcohol Use: No    Review of Systems  Constitutional: Negative for fever. Eyes: Negative for visual changes. ENT: Negative for sore throat. Cardiovascular: Negative for chest pain. Respiratory: Negative for shortness of breath. Gastrointestinal: Negative for abdominal pain, vomiting and diarrhea. Genitourinary: Negative for dysuria. Musculoskeletal: Negative for back pain. Positive right hand pain and swelling redness Skin: Negative for rash. Neurological: Negative for headaches, focal weakness or numbness.  10-point ROS otherwise negative.  ____________________________________________   PHYSICAL EXAM:  VITAL SIGNS: ED Triage Vitals  Enc Vitals Group     BP 03/16/15 2205 131/76 mmHg     Pulse Rate 03/16/15 2205 73     Resp 03/16/15 2205 18     Temp 03/16/15 2205 98.2 F (36.8 C)     Temp Source 03/16/15 2205 Oral     SpO2 03/16/15 2205 99 %     Weight 03/16/15 2205 140 lb (63.504 kg)     Height 03/16/15 2205  5\' 1"  (1.549 m)     Head Cir --      Peak Flow --      Pain Score 03/16/15 2206 6     Pain Loc --      Pain Edu? --      Excl. in GC? --     Constitutional: Alert and oriented. Well appearing and in no distress. Eyes: Conjunctivae are normal. PERRL. Normal extraocular movements. ENT   Head: Normocephalic and atraumatic.   Nose: No congestion/rhinnorhea.   Mouth/Throat: Mucous membranes are moist.   Neck: No stridor. Cardiovascular: Normal rate, regular rhythm. Normal and symmetric distal pulses are present in all extremities. No murmurs, rubs, or gallops. Respiratory: Normal respiratory effort without tachypnea nor retractions. Breath sounds are clear and equal bilaterally. No wheezes/rales/rhonchi. Gastrointestinal: Soft and nontender. No distention. There is no CVA tenderness. Genitourinary: deferred Musculoskeletal: Nontender with normal  range of motion in all extremities. No joint effusions.  No lower extremity tenderness nor edema. Neurologic:  Normal speech and language. No gross focal neurologic deficits are appreciated. Speech is normal.  Skin:  Right hand pain swelling and erythema on the dorsal aspect.[id number] Psychiatric: Mood and affect are normal. Speech and behavior are normal. Patient exhibits appropriate insight and judgment.  Radiology:    DG Hand Complete Right (Final result) Result time: 03/16/15 23:20:39   Final result by Rad Results In Interface (03/16/15 23:20:39)   Narrative:   CLINICAL DATA: Patient fell out of bed at 5:30 this morning. Pain and redness of the right hand since then.  EXAM: RIGHT HAND - COMPLETE 3+ VIEW  COMPARISON: None.  FINDINGS: There is no evidence of fracture or dislocation. There is no evidence of arthropathy or other focal bone abnormality. Soft tissues are unremarkable.  IMPRESSION: Negative.   Electronically Signed By: 05/16/15 M.D. On: 03/16/2015 23:20     INITIAL IMPRESSION / ASSESSMENT AND PLAN / ED COURSE  Pertinent labs & imaging results that were available during my care of the patient were reviewed by me and considered in my medical decision making (see chart for details).  History of physical exam concerning for possible cellulitis of the posterior aspect of the right hand. As such patient received Keflex 500 mg Toradol 10 mg by mouth for pain  ____________________________________________   FINAL CLINICAL IMPRESSION(S) / ED DIAGNOSES  Final diagnoses:  Cellulitis of right hand      05/16/2015, MD 03/17/15 [phone]

## 2015-04-28 ENCOUNTER — Emergency Department
Admission: EM | Admit: 2015-04-28 | Discharge: 2015-04-28 | Disposition: A | Discharge status: Home / Self Care | Payer: Self-pay | Attending: Emergency Medicine | Admitting: Emergency Medicine

## 2015-04-28 DIAGNOSIS — Z79891 Long term (current) use of opiate analgesic: Secondary | ICD-10-CM | POA: Insufficient documentation

## 2015-04-28 DIAGNOSIS — Z79899 Other long term (current) drug therapy: Secondary | ICD-10-CM | POA: Insufficient documentation

## 2015-04-28 DIAGNOSIS — E119 Type 2 diabetes mellitus without complications: Secondary | ICD-10-CM | POA: Insufficient documentation

## 2015-04-28 DIAGNOSIS — Z794 Long term (current) use of insulin: Secondary | ICD-10-CM | POA: Insufficient documentation

## 2015-04-28 DIAGNOSIS — Z7984 Long term (current) use of oral hypoglycemic drugs: Secondary | ICD-10-CM | POA: Insufficient documentation

## 2015-04-28 DIAGNOSIS — Z720 Tobacco use: Secondary | ICD-10-CM | POA: Insufficient documentation

## 2015-04-28 DIAGNOSIS — J0100 Acute maxillary sinusitis, unspecified: Secondary | ICD-10-CM | POA: Insufficient documentation

## 2015-04-28 DIAGNOSIS — J209 Acute bronchitis, unspecified: Secondary | ICD-10-CM | POA: Insufficient documentation

## 2015-04-28 MED ORDER — BENZONATATE 100 MG PO CAPS
200.0000 mg | ORAL_CAPSULE | Freq: Once | ORAL | Status: AC
  Administered 2015-04-28: 200 mg via ORAL
  Filled 2015-04-28: qty 2

## 2015-04-28 MED ORDER — BENZONATATE 200 MG PO CAPS: 200.0000 mg | ORAL_CAPSULE | Freq: Three times a day (TID) | ORAL | Status: DC | PRN

## 2015-04-28 MED ORDER — AZITHROMYCIN 250 MG PO TABS
500.0000 mg | ORAL_TABLET | Freq: Once | ORAL | Status: AC
  Administered 2015-04-28: 500 mg via ORAL
  Filled 2015-04-28: qty 2

## 2015-04-28 MED ORDER — AZITHROMYCIN 250 MG PO TABS: 500.0000 mg | ORAL_TABLET | Freq: Every day | ORAL | Status: AC

## 2015-04-28 NOTE — ED Notes (Addendum)
Patient ambulatory to triage with steady gait, without difficulty or distress noted; pt reports since yesterday having fever, nonproductive cough, sinus congestion and frontal HA and difficulty hearing out of ears

## 2015-04-28 NOTE — Discharge Instructions (Signed)
Acute Bronchitis Bronchitis is inflammation of the airways that extend from the windpipe into the lungs (bronchi). The inflammation often causes mucus to develop. This leads to a cough, which is the most common symptom of bronchitis.  In acute bronchitis, the condition usually develops suddenly and goes away over time, usually in a couple weeks. Smoking, allergies, and asthma can make bronchitis worse. Repeated episodes of bronchitis may cause further lung problems.  CAUSES Acute bronchitis is most often caused by the same virus that causes a cold. The virus can spread from person to person (contagious) through coughing, sneezing, and touching contaminated objects. SIGNS AND SYMPTOMS   Cough.   Fever.   Coughing up mucus.   Body aches.   Chest congestion.   Chills.   Shortness of breath.   Sore throat.  DIAGNOSIS  Acute bronchitis is usually diagnosed through a physical exam. Your health care provider will also ask you questions about your medical history. Tests, such as chest X-rays, are sometimes done to rule out other conditions.  TREATMENT  Acute bronchitis usually goes away in a couple weeks. Oftentimes, no medical treatment is necessary. Medicines are sometimes given for relief of fever or cough. Antibiotic medicines are usually not needed but may be prescribed in certain situations. In some cases, an inhaler may be recommended to help reduce shortness of breath and control the cough. A cool mist vaporizer may also be used to help thin bronchial secretions and make it easier to clear the chest.  HOME CARE INSTRUCTIONS  Get plenty of rest.   Drink enough fluids to keep your urine clear or pale yellow (unless you have a medical condition that requires fluid restriction). Increasing fluids may help thin your respiratory secretions (sputum) and reduce chest congestion, and it will prevent dehydration.   Take medicines only as directed by your health care provider.  If  you were prescribed an antibiotic medicine, finish it all even if you start to feel better.  Avoid smoking and secondhand smoke. Exposure to cigarette smoke or irritating chemicals will make bronchitis worse. If you are a smoker, consider using nicotine gum or skin patches to help control withdrawal symptoms. Quitting smoking will help your lungs heal faster.   Reduce the chances of another bout of acute bronchitis by washing your hands frequently, avoiding people with cold symptoms, and trying not to touch your hands to your mouth, nose, or eyes.   Keep all follow-up visits as directed by your health care provider.  SEEK MEDICAL CARE IF: Your symptoms do not improve after 1 week of treatment.  SEEK IMMEDIATE MEDICAL CARE IF:  You develop an increased fever or chills.   You have chest pain.   You have severe shortness of breath.  You have bloody sputum.   You develop dehydration.  You faint or repeatedly feel like you are going to pass out.  You develop repeated vomiting.  You develop a severe headache. MAKE SURE YOU:   Understand these instructions.  Will watch your condition.  Will get help right away if you are not doing well or get worse.   This information is not intended to replace advice given to you by your health care provider. Make sure you discuss any questions you have with your health care provider.   Document Released: 08/04/2004 Document Revised: 07/18/2014 Document Reviewed: 12/18/2012 Elsevier Interactive Patient Education 2016 [employer] Sinusitis, Adult Sinusitis is redness, soreness, and inflammation of the paranasal sinuses. Paranasal sinuses are air pockets within the   bones of your face. They are located beneath your eyes, in the middle of your forehead, and above your eyes. In healthy paranasal sinuses, mucus is able to drain out, and air is able to circulate through them by way of your nose. However, when your paranasal sinuses are inflamed,  mucus and air can become trapped. This can allow bacteria and other germs to grow and cause infection. Sinusitis can develop quickly and last only a short time (acute) or continue over a long period (chronic). Sinusitis that lasts for more than 12 weeks is considered chronic. CAUSES Causes of sinusitis include:  Allergies.  Structural abnormalities, such as displacement of the cartilage that separates your nostrils (deviated septum), which can decrease the air flow through your nose and sinuses and affect sinus drainage.  Functional abnormalities, such as when the small hairs (cilia) that line your sinuses and help remove mucus do not work properly or are not present. SIGNS AND SYMPTOMS Symptoms of acute and chronic sinusitis are the same. The primary symptoms are pain and pressure around the affected sinuses. Other symptoms include:  Upper toothache.  Earache.  Headache.  Bad breath.  Decreased sense of smell and taste.  A cough, which worsens when you are lying flat.  Fatigue.  Fever.  Thick drainage from your nose, which often is green and may contain pus (purulent).  Swelling and warmth over the affected sinuses. DIAGNOSIS Your health care provider will perform a physical exam. During your exam, your health care provider may perform any of the following to help determine if you have acute sinusitis or chronic sinusitis:  Look in your nose for signs of abnormal growths in your nostrils (nasal polyps).  Tap over the affected sinus to check for signs of infection.  View the inside of your sinuses using an imaging device that has a light attached (endoscope). If your health care provider suspects that you have chronic sinusitis, one or more of the following tests may be recommended:  Allergy tests.  Nasal culture. A sample of mucus is taken from your nose, sent to a lab, and screened for bacteria.  Nasal cytology. A sample of mucus is taken from your nose and examined by  your health care provider to determine if your sinusitis is related to an allergy. TREATMENT Most cases of acute sinusitis are related to a viral infection and will resolve on their own within 10 days. Sometimes, medicines are prescribed to help relieve symptoms of both acute and chronic sinusitis. These may include pain medicines, decongestants, nasal steroid sprays, or saline sprays. However, for sinusitis related to a bacterial infection, your health care provider will prescribe antibiotic medicines. These are medicines that will help kill the bacteria causing the infection. Rarely, sinusitis is caused by a fungal infection. In these cases, your health care provider will prescribe antifungal medicine. For some cases of chronic sinusitis, surgery is needed. Generally, these are cases in which sinusitis recurs more than 3 times per year, despite other treatments. HOME CARE INSTRUCTIONS  Drink plenty of water. Water helps thin the mucus so your sinuses can drain more easily.  Use a humidifier.  Inhale steam 3-4 times a day (for example, sit in the bathroom with the shower running).  Apply a warm, moist washcloth to your face 3-4 times a day, or as directed by your health care provider.  Use saline nasal sprays to help moisten and clean your sinuses.  Take medicines only as directed by your health care provider.  If   you were prescribed either an antibiotic or antifungal medicine, finish it all even if you start to feel better. SEEK IMMEDIATE MEDICAL CARE IF:  You have increasing pain or severe headaches.  You have nausea, vomiting, or drowsiness.  You have swelling around your face.  You have vision problems.  You have a stiff neck.  You have difficulty breathing.   This information is not intended to replace advice given to you by your health care provider. Make sure you discuss any questions you have with your health care provider.   Document Released: 06/27/2005 Document  Revised: 07/18/2014 Document Reviewed: 07/12/2011 Elsevier Interactive Patient Education 2016 [employer] Inc[employer]  

## 2015-04-28 NOTE — ED Notes (Signed)
Pt also with H/A and sinus congestion.

## 2015-04-28 NOTE — ED Provider Notes (Signed)
[facility] Emergency Department Provider Note  ____________________________________________  Time seen: 3:45 AM  I have reviewed the triage vital signs and the nursing notes.   HISTORY  Chief Complaint Nasal Congestion; Cough; and Headache      HPI Karen Lindsey is a 49 y.o. female presents with cough congestion and frontal headache and bilateral decreased hearing 2 days. Patient also admits to chills. Patient admits to 2 sick contacts with similar symptoms. Patient denies any alleviating or aggravating factors.    Past Medical History  Diagnosis Date  . Diabetes mellitus without complication   . Neuropathy   . Arthritis     rheumatoid    There are no active problems to display for this patient.   Past Surgical History  Procedure Laterality Date  . Knee surgery    . Abdominal hysterectomy    . Cesarean section    . Mandible fracture surgery    . Cholecystectomy      Current Outpatient Rx  Name  Route  Sig  Dispense  Refill  . cyclobenzaprine (FLEXERIL) 10 MG tablet   Oral   Take 1 tablet (10 mg total) by mouth every 8 (eight) hours as needed for muscle spasms.   20 tablet   1   . insulin aspart protamine- aspart (NOVOLOG MIX 70/30) (70-30) 100 UNIT/ML injection   Subcutaneous   Inject into the skin.         Marland Kitchen insulin glargine (LANTUS) 100 UNIT/ML injection   Subcutaneous   Inject into the skin at bedtime.         Marland Kitchen ketorolac (TORADOL) 10 MG tablet   Oral   Take 1 tablet (10 mg total) by mouth every 8 (eight) hours as needed.   20 tablet   0   . lisinopril (PRINIVIL,ZESTRIL) 10 MG tablet   Oral   Take 1 tablet (10 mg total) by mouth daily.   30 tablet   0   . LORazepam (ATIVAN) 0.5 MG tablet   Oral   Take 1 tablet (0.5 mg total) by mouth every 8 (eight) hours as needed for anxiety.   10 tablet   0   . metFORMIN (GLUCOPHAGE) 1000 MG tablet   Oral   Take 500 mg by mouth 2 (two) times daily with a meal.          . methadone (DOLOPHINE) 5 MG/5ML solution   Oral   Take 130 mg by mouth daily.           Allergies Ultram  No family history on file.  Social History Social History  Substance Use Topics  . Smoking status: Current Every Day Smoker  . Smokeless tobacco: Not on file  . Alcohol Use: No    Review of Systems  Constitutional: Negative for fever. Eyes: Negative for visual changes. ENT: Negative for sore throat. Positive for nasal congestion Cardiovascular: Negative for chest pain. Respiratory: Negative for shortness of breath. Positive for nonproductive cough Gastrointestinal: Negative for abdominal pain, vomiting and diarrhea. Genitourinary: Negative for dysuria. Musculoskeletal: Negative for back pain. Skin: Negative for rash. Neurological: Negative for headaches, focal weakness or numbness.   10-point ROS otherwise negative.  ____________________________________________   PHYSICAL EXAM:  VITAL SIGNS: ED Triage Vitals  Enc Vitals Group     BP 04/28/15 0219 151/76 mmHg     Pulse Rate 04/28/15 0219 64     Resp 04/28/15 0219 20     Temp 04/28/15 0219 97.7 F (36.5 C)  Temp Source 04/28/15 0219 Oral     SpO2 04/28/15 0219 98 %     Weight --      Height --      Head Cir --      Peak Flow --      Pain Score 04/28/15 0219 5     Pain Loc --      Pain Edu? --      Excl. in GC? --      Constitutional: Alert and oriented. Well appearing and in no distress. Eyes: Conjunctivae are normal. PERRL. Normal extraocular movements. ENT   Head: Normocephalic and atraumatic. Pain to palpation of the maxillary sinus   Nose: No congestion/rhinnorhea.   Mouth/Throat: Mucous membranes are moist.   Neck: No stridor. Ears: Right external auditory canal occluded by cerumen. Clear fluid noted posterior to the left tympanic membrane. Hematological/Lymphatic/Immunilogical: No cervical lymphadenopathy. Cardiovascular: Normal rate, regular rhythm. Normal and  symmetric distal pulses are present in all extremities. No murmurs, rubs, or gallops. Respiratory: Normal respiratory effort without tachypnea nor retractions. Breath sounds are clear and equal bilaterally. No wheezes/rales/rhonchi. Gastrointestinal: Soft and nontender. No distention. There is no CVA tenderness. Genitourinary: deferred Musculoskeletal: Nontender with normal range of motion in all extremities. No joint effusions.  No lower extremity tenderness nor edema. Neurologic:  Normal speech and language. No gross focal neurologic deficits are appreciated. Speech is normal.  Skin:  Skin is warm, dry and intact. No rash noted. Psychiatric: Mood and affect are normal. Speech and behavior are normal. Patient exhibits appropriate insight and judgment.     INITIAL IMPRESSION / ASSESSMENT AND PLAN / ED COURSE  Pertinent labs & imaging results that were available during my care of the patient were reviewed by me and considered in my medical decision making (see chart for details).  History of physical exam consistent with sinusitis/bronchitis patient does smoke as such will be prescribed azithromycin.  ____________________________________________   FINAL CLINICAL IMPRESSION(S) / ED DIAGNOSES  Final diagnoses:  Acute maxillary sinusitis, recurrence not specified  Acute bronchitis, unspecified organism      [name], MD 04/28/15 [phone]

## 2015-04-29 ENCOUNTER — Encounter: Payer: Self-pay | Admitting: *Deleted

## 2015-04-29 ENCOUNTER — Emergency Department: Payer: Self-pay

## 2015-04-29 ENCOUNTER — Emergency Department
Admission: EM | Admit: 2015-04-29 | Discharge: 2015-04-30 | Disposition: A | Discharge status: Home / Self Care | Payer: Self-pay | Attending: Emergency Medicine | Admitting: Emergency Medicine

## 2015-04-29 DIAGNOSIS — Z794 Long term (current) use of insulin: Secondary | ICD-10-CM | POA: Insufficient documentation

## 2015-04-29 DIAGNOSIS — H6121 Impacted cerumen, right ear: Secondary | ICD-10-CM | POA: Insufficient documentation

## 2015-04-29 DIAGNOSIS — H6502 Acute serous otitis media, left ear: Secondary | ICD-10-CM | POA: Insufficient documentation

## 2015-04-29 DIAGNOSIS — Z79891 Long term (current) use of opiate analgesic: Secondary | ICD-10-CM | POA: Insufficient documentation

## 2015-04-29 DIAGNOSIS — Z792 Long term (current) use of antibiotics: Secondary | ICD-10-CM | POA: Insufficient documentation

## 2015-04-29 DIAGNOSIS — J209 Acute bronchitis, unspecified: Secondary | ICD-10-CM | POA: Insufficient documentation

## 2015-04-29 DIAGNOSIS — E119 Type 2 diabetes mellitus without complications: Secondary | ICD-10-CM | POA: Insufficient documentation

## 2015-04-29 DIAGNOSIS — Z720 Tobacco use: Secondary | ICD-10-CM | POA: Insufficient documentation

## 2015-04-29 DIAGNOSIS — J018 Other acute sinusitis: Secondary | ICD-10-CM | POA: Insufficient documentation

## 2015-04-29 DIAGNOSIS — R319 Hematuria, unspecified: Secondary | ICD-10-CM | POA: Insufficient documentation

## 2015-04-29 DIAGNOSIS — Z79899 Other long term (current) drug therapy: Secondary | ICD-10-CM | POA: Insufficient documentation

## 2015-04-29 DIAGNOSIS — Z7984 Long term (current) use of oral hypoglycemic drugs: Secondary | ICD-10-CM | POA: Insufficient documentation

## 2015-04-29 LAB — URINALYSIS COMPLETE WITH MICROSCOPIC (ARMC ONLY)
BACTERIA UA: NONE SEEN
BILIRUBIN URINE: NEGATIVE
Hgb urine dipstick: NEGATIVE
Ketones, ur: NEGATIVE mg/dL
LEUKOCYTES UA: NEGATIVE
Nitrite: NEGATIVE
Protein, ur: NEGATIVE mg/dL
SQUAMOUS EPITHELIAL / LPF: NONE SEEN
Specific Gravity, Urine: 1.028 (ref 1.005–1.030)
pH: 5 (ref 5.0–8.0)

## 2015-04-29 MED ORDER — CARBAMIDE PEROXIDE 6.5 % OT SOLN
10.0000 [drp] | Freq: Once | OTIC | Status: AC
  Administered 2015-04-29: 10 [drp] via OTIC
  Filled 2015-04-29: qty 15

## 2015-04-29 MED ORDER — KETOROLAC TROMETHAMINE 60 MG/2ML IM SOLN
60.0000 mg | Freq: Once | INTRAMUSCULAR | Status: AC
  Administered 2015-04-29: 60 mg via INTRAMUSCULAR
  Filled 2015-04-29: qty 2

## 2015-04-29 NOTE — ED Notes (Addendum)
Pt reports "everything still bothering me" from yesterday when seen here for same. Bilateral ear pain, noted blood when wiping after urination. No pain with urination. States unable to urinate at this time.

## 2015-04-29 NOTE — ED Provider Notes (Signed)
[facility] Emergency Department Provider Note ____________________________________________  Time seen: Approximately 11:19 PM  I have reviewed the triage vital signs and the nursing notes.   HISTORY  Chief Complaint Otalgia and Hematuria   HPI Karen Lindsey is a 49 y.o. female who presents to the emergency department for evaluation of ear pain, sinus pressure, and hematuria. She was evaluated here 24 hours ago for similar symptoms. She has been unable to get the ear wax out of her right ear and continues to have pain. She is taking the zithromax as advised. She states she began having some blood on the paper after urinating. No dysuria.   Past Medical History  Diagnosis Date  . Diabetes mellitus without complication (HCC)   . Neuropathy (HCC)   . Arthritis     rheumatoid    There are no active problems to display for this patient.   Past Surgical History  Procedure Laterality Date  . Knee surgery    . Abdominal hysterectomy    . Cesarean section    . Mandible fracture surgery    . Cholecystectomy      Current Outpatient Rx  Name  Route  Sig  Dispense  Refill  . azithromycin (ZITHROMAX) 250 MG tablet   Oral   Take 2 tablets (500 mg total) by mouth daily.   6 each   0   . benzonatate (TESSALON) 200 MG capsule   Oral   Take 1 capsule (200 mg total) by mouth 3 (three) times daily as needed for cough.   20 capsule   0   . cyclobenzaprine (FLEXERIL) 10 MG tablet   Oral   Take 1 tablet (10 mg total) by mouth every 8 (eight) hours as needed for muscle spasms.   20 tablet   1   . insulin aspart protamine- aspart (NOVOLOG MIX 70/30) (70-30) 100 UNIT/ML injection   Subcutaneous   Inject into the skin.         Marland Kitchen insulin glargine (LANTUS) 100 UNIT/ML injection   Subcutaneous   Inject into the skin at bedtime.         Marland Kitchen ketorolac (TORADOL) 10 MG tablet   Oral   Take 1 tablet (10 mg total) by mouth every 8 (eight) hours as needed.   20  tablet   0   . lisinopril (PRINIVIL,ZESTRIL) 10 MG tablet   Oral   Take 1 tablet (10 mg total) by mouth daily.   30 tablet   0   . LORazepam (ATIVAN) 0.5 MG tablet   Oral   Take 1 tablet (0.5 mg total) by mouth every 8 (eight) hours as needed for anxiety.   10 tablet   0   . metFORMIN (GLUCOPHAGE) 1000 MG tablet   Oral   Take 500 mg by mouth 2 (two) times daily with a meal.         . methadone (DOLOPHINE) 5 MG/5ML solution   Oral   Take 130 mg by mouth daily.           Allergies Ultram  No family history on file.  Social History Social History  Substance Use Topics  . Smoking status: Current Every Day Smoker  . Smokeless tobacco: None  . Alcohol Use: No    Review of Systems Constitutional: No fever/chills Eyes: No visual changes. ENT: No sore throat. Cardiovascular: Denies chest pain. Respiratory: Denies shortness of breath. Gastrointestinal: No abdominal pain.  No nausea, no vomiting.  No diarrhea.  No  constipation. Genitourinary: Negative for dysuria. Musculoskeletal: Negative for back pain. Skin: Negative for rash. Neurological: Negative for headaches, focal weakness or numbness.  10-point ROS otherwise negative.  ____________________________________________   PHYSICAL EXAM:  VITAL SIGNS: ED Triage Vitals  Enc Vitals Group     BP 04/29/15 2242 133/80 mmHg     Pulse Rate 04/29/15 2242 68     Resp 04/29/15 2242 18     Temp 04/29/15 2242 97.8 F (36.6 C)     Temp Source 04/29/15 2242 Oral     SpO2 04/29/15 2242 97 %     Weight --      Height --      Head Cir --      Peak Flow --      Pain Score 04/29/15 2243 10     Pain Loc --      Pain Edu? --      Excl. in GC? --     Constitutional: Alert and oriented. Well appearing and in no acute distress. Eyes: Conjunctivae are normal. PERRL. EOMI. Head: Atraumatic. Nose: Positive for congestion/rhinnorhea. Mouth/Throat: Mucous membranes are moist.  Oropharynx non-erythematous. Ear: right  cerumen impaction; serous OM on the left Neck: No stridor.   Cardiovascular: Normal rate, regular rhythm. Grossly normal heart sounds.  Good peripheral circulation. Respiratory: Normal respiratory effort.  No retractions. Lungs CTAB. Gastrointestinal: Soft and nontender. No distention. No abdominal bruits. No CVA tenderness. Musculoskeletal: No lower extremity tenderness nor edema.  No joint effusions. Neurologic:  Normal speech and language. No gross focal neurologic deficits are appreciated. No gait instability. Skin:  Skin is warm, dry and intact. No rash noted. Psychiatric: Mood and affect are normal. Speech and behavior are normal.  ____________________________________________   LABS (all labs ordered are listed, but only abnormal results are displayed)  Labs Reviewed  URINALYSIS COMPLETEWITH MICROSCOPIC ([facility] ONLY) - Abnormal; Notable for the following:    Color, Urine YELLOW (*)    APPearance CLEAR (*)    Glucose, UA >500 (*)    All other components within normal limits  GLUCOSE, CAPILLARY - Abnormal; Notable for the following:    Glucose-Capillary 418 (*)    All other components within normal limits   ____________________________________________  EKG   ____________________________________________  RADIOLOGY  Chest x-ray negative for acute abnormality ____________________________________________   PROCEDURES  Procedure(s) performed: Cerumen impaction removed by RN  Critical Care performed: No  ____________________________________________   INITIAL IMPRESSION / ASSESSMENT AND PLAN / ED COURSE  Pertinent labs & imaging results that were available during my care of the patient were reviewed by me and considered in my medical decision making (see chart for details).  Patient instructed to follow up with her PCP or return to the ER for symptoms that change or worsen or for new concerns. ____________________________________________   FINAL CLINICAL  IMPRESSION(S) / ED DIAGNOSES  Final diagnoses:  Cerumen impaction, right  Acute bronchitis, unspecified organism  Other acute sinusitis      Chinita Pester, FNP 04/30/15 1537  Jennye Moccasin, MD 05/03/15 [phone]

## 2015-04-30 LAB — GLUCOSE, CAPILLARY: Glucose-Capillary: 418 mg/dL — ABNORMAL HIGH (ref 65–99)

## 2015-04-30 MED ORDER — INSULIN ASPART 100 UNIT/ML ~~LOC~~ SOLN
8.0000 [IU] | Freq: Once | SUBCUTANEOUS | Status: AC
  Administered 2015-04-30: 8 [IU] via SUBCUTANEOUS
  Filled 2015-04-30: qty 8

## 2015-04-30 MED ORDER — OXYCODONE-ACETAMINOPHEN 5-325 MG PO TABS
1.0000 | ORAL_TABLET | Freq: Once | ORAL | Status: AC
Start: 2015-04-30 — End: 2015-04-30
  Administered 2015-04-30: 1 via ORAL
  Filled 2015-04-30: qty 1

## 2015-04-30 NOTE — Discharge Instructions (Signed)
Acute Bronchitis Bronchitis is inflammation of the airways that extend from the windpipe into the lungs (bronchi). The inflammation often causes mucus to develop. This leads to a cough, which is the most common symptom of bronchitis.  In acute bronchitis, the condition usually develops suddenly and goes away over time, usually in a couple weeks. Smoking, allergies, and asthma can make bronchitis worse. Repeated episodes of bronchitis may cause further lung problems.  CAUSES Acute bronchitis is most often caused by the same virus that causes a cold. The virus can spread from person to person (contagious) through coughing, sneezing, and touching contaminated objects. SIGNS AND SYMPTOMS   Cough.   Fever.   Coughing up mucus.   Body aches.   Chest congestion.   Chills.   Shortness of breath.   Sore throat.  DIAGNOSIS  Acute bronchitis is usually diagnosed through a physical exam. Your health care provider will also ask you questions about your medical history. Tests, such as chest X-rays, are sometimes done to rule out other conditions.  TREATMENT  Acute bronchitis usually goes away in a couple weeks. Oftentimes, no medical treatment is necessary. Medicines are sometimes given for relief of fever or cough. Antibiotic medicines are usually not needed but may be prescribed in certain situations. In some cases, an inhaler may be recommended to help reduce shortness of breath and control the cough. A cool mist vaporizer may also be used to help thin bronchial secretions and make it easier to clear the chest.  HOME CARE INSTRUCTIONS  Get plenty of rest.   Drink enough fluids to keep your urine clear or pale yellow (unless you have a medical condition that requires fluid restriction). Increasing fluids may help thin your respiratory secretions (sputum) and reduce chest congestion, and it will prevent dehydration.   Take medicines only as directed by your health care provider.  If  you were prescribed an antibiotic medicine, finish it all even if you start to feel better.  Avoid smoking and secondhand smoke. Exposure to cigarette smoke or irritating chemicals will make bronchitis worse. If you are a smoker, consider using nicotine gum or skin patches to help control withdrawal symptoms. Quitting smoking will help your lungs heal faster.   Reduce the chances of another bout of acute bronchitis by washing your hands frequently, avoiding people with cold symptoms, and trying not to touch your hands to your mouth, nose, or eyes.   Keep all follow-up visits as directed by your health care provider.  SEEK MEDICAL CARE IF: Your symptoms do not improve after 1 week of treatment.  SEEK IMMEDIATE MEDICAL CARE IF:  You develop an increased fever or chills.   You have chest pain.   You have severe shortness of breath.  You have bloody sputum.   You develop dehydration.  You faint or repeatedly feel like you are going to pass out.  You develop repeated vomiting.  You develop a severe headache. MAKE SURE YOU:   Understand these instructions.  Will watch your condition.  Will get help right away if you are not doing well or get worse.   This information is not intended to replace advice given to you by your health care provider. Make sure you discuss any questions you have with your health care provider.   Document Released: 08/04/2004 Document Revised: 07/18/2014 Document Reviewed: 12/18/2012 Elsevier Interactive Patient Education 2016 [employer]  Cerumen Impaction The structures of the external ear canal secrete a waxy substance known as cerumen. Excess cerumen  can build up in the ear canal, causing a condition known as cerumen impaction. Cerumen impaction can cause ear pain and disrupt the function of the ear. The rate of cerumen production differs for each individual. In certain individuals, the configuration of the ear canal may decrease his or her  ability to naturally remove cerumen. CAUSES Cerumen impaction is caused by excessive cerumen production or buildup. RISK FACTORS  Frequent use of swabs to clean ears.  Having narrow ear canals.  Having eczema.  Being dehydrated. SIGNS AND SYMPTOMS  Diminished hearing.  Ear drainage.  Ear pain.  Ear itch. TREATMENT Treatment may involve:  Over-the-counter or prescription ear drops to soften the cerumen.  Removal of cerumen by a health care provider. This may be done with:  Irrigation with warm water. This is the most common method of removal.  Ear curettes and other instruments.  Surgery. This may be done in severe cases. HOME CARE INSTRUCTIONS  Take medicines only as directed by your health care provider.  Do not insert objects into the ear with the intent of cleaning the ear. PREVENTION  Do not insert objects into the ear, even with the intent of cleaning the ear. Removing cerumen as a part of normal hygiene is not necessary, and the use of swabs in the ear canal is not recommended.  Drink enough water to keep your urine clear or pale yellow.  Control your eczema if you have it. SEEK MEDICAL CARE IF:  You develop ear pain.  You develop bleeding from the ear.  The cerumen does not clear after you use ear drops as directed.   This information is not intended to replace advice given to you by your health care provider. Make sure you discuss any questions you have with your health care provider.   Document Released: 08/04/2004 Document Revised: 07/18/2014 Document Reviewed: 02/11/2015 Elsevier Interactive Patient Education [employer]

## 2015-05-12 ENCOUNTER — Emergency Department
Admission: EM | Admit: 2015-05-12 | Discharge: 2015-05-12 | Disposition: A | Discharge status: Home / Self Care | Payer: Self-pay | Attending: Emergency Medicine | Admitting: Emergency Medicine

## 2015-05-12 DIAGNOSIS — Z79899 Other long term (current) drug therapy: Secondary | ICD-10-CM | POA: Insufficient documentation

## 2015-05-12 DIAGNOSIS — R109 Unspecified abdominal pain: Secondary | ICD-10-CM

## 2015-05-12 DIAGNOSIS — E119 Type 2 diabetes mellitus without complications: Secondary | ICD-10-CM | POA: Insufficient documentation

## 2015-05-12 DIAGNOSIS — Z720 Tobacco use: Secondary | ICD-10-CM | POA: Insufficient documentation

## 2015-05-12 DIAGNOSIS — H9201 Otalgia, right ear: Secondary | ICD-10-CM | POA: Insufficient documentation

## 2015-05-12 DIAGNOSIS — Z79891 Long term (current) use of opiate analgesic: Secondary | ICD-10-CM | POA: Insufficient documentation

## 2015-05-12 DIAGNOSIS — Z794 Long term (current) use of insulin: Secondary | ICD-10-CM | POA: Insufficient documentation

## 2015-05-12 MED ORDER — LIDOCAINE VISCOUS 2 % MT SOLN
OROMUCOSAL | Status: AC
  Administered 2015-05-12: 2 mL via OROMUCOSAL
  Filled 2015-05-12: qty 15

## 2015-05-12 MED ORDER — LEVOFLOXACIN 500 MG PO TABS
500.0000 mg | ORAL_TABLET | Freq: Once | ORAL | Status: AC
  Administered 2015-05-12: 500 mg via ORAL
  Filled 2015-05-12: qty 1

## 2015-05-12 MED ORDER — KETOROLAC TROMETHAMINE 10 MG PO TABS
10.0000 mg | ORAL_TABLET | Freq: Once | ORAL | Status: AC
  Administered 2015-05-12: 10 mg via ORAL
  Filled 2015-05-12: qty 1

## 2015-05-12 MED ORDER — LIDOCAINE HCL 2 % EX GEL
CUTANEOUS | Status: AC
  Filled 2015-05-12: qty 5

## 2015-05-12 MED ORDER — LEVOFLOXACIN 500 MG PO TABS: 500.0000 mg | ORAL_TABLET | Freq: Every day | ORAL | Status: AC

## 2015-05-12 MED ORDER — KETOROLAC TROMETHAMINE 10 MG PO TABS: 10.0000 mg | ORAL_TABLET | Freq: Three times a day (TID) | ORAL | Status: DC | PRN

## 2015-05-12 MED ORDER — LIDOCAINE VISCOUS 2 % MT SOLN
15.0000 mL | Freq: Once | OROMUCOSAL | Status: AC
  Administered 2015-05-12: 2 mL via OROMUCOSAL

## 2015-05-12 NOTE — ED Provider Notes (Signed)
[facility] Emergency Department Provider Note  ____________________________________________  Time seen: 5:55 AM  I have reviewed the triage vital signs and the nursing notes.   HISTORY  Chief Complaint Otalgia     HPI Karen Lindsey is a 49 y.o. female presents with right ear pain that is currently 7 out of 10. Patient seen recently for the same on multiple occasions in this emergency Department. Patient states that she has had continued congestion however denies any cough. No fever.   Past Medical History  Diagnosis Date  . Diabetes mellitus without complication (HCC)   . Neuropathy (HCC)   . Arthritis     rheumatoid    There are no active problems to display for this patient.   Past Surgical History  Procedure Laterality Date  . Knee surgery    . Abdominal hysterectomy    . Cesarean section    . Mandible fracture surgery    . Cholecystectomy      Current Outpatient Rx  Name  Route  Sig  Dispense  Refill  . benzonatate (TESSALON) 200 MG capsule   Oral   Take 1 capsule (200 mg total) by mouth 3 (three) times daily as needed for cough.   20 capsule   0   . cyclobenzaprine (FLEXERIL) 10 MG tablet   Oral   Take 1 tablet (10 mg total) by mouth every 8 (eight) hours as needed for muscle spasms.   20 tablet   1   . insulin aspart protamine- aspart (NOVOLOG MIX 70/30) (70-30) 100 UNIT/ML injection   Subcutaneous   Inject into the skin.         Marland Kitchen insulin glargine (LANTUS) 100 UNIT/ML injection   Subcutaneous   Inject into the skin at bedtime.         Marland Kitchen ketorolac (TORADOL) 10 MG tablet   Oral   Take 1 tablet (10 mg total) by mouth every 8 (eight) hours as needed.   20 tablet   0   . lisinopril (PRINIVIL,ZESTRIL) 10 MG tablet   Oral   Take 1 tablet (10 mg total) by mouth daily.   30 tablet   0   . LORazepam (ATIVAN) 0.5 MG tablet   Oral   Take 1 tablet (0.5 mg total) by mouth every 8 (eight) hours as needed for anxiety.  10 tablet   0   . metFORMIN (GLUCOPHAGE) 1000 MG tablet   Oral   Take 500 mg by mouth 2 (two) times daily with a meal.         . methadone (DOLOPHINE) 5 MG/5ML solution   Oral   Take 130 mg by mouth daily.           Allergies Ultram  No family history on file.  Social History Social History  Substance Use Topics  . Smoking status: Current Every Day Smoker  . Smokeless tobacco: Not on file  . Alcohol Use: No    Review of Systems  Constitutional: Negative for fever. Eyes: Negative for visual changes. ENT: Negative for sore throat. Positive for right ear pain Cardiovascular: Negative for chest pain. Respiratory: Negative for shortness of breath. Gastrointestinal: Negative for abdominal pain, vomiting and diarrhea. Genitourinary: Negative for dysuria. Musculoskeletal: Negative for back pain. Skin: Negative for rash. Neurological: Negative for headaches, focal weakness or numbness.   10-point ROS otherwise negative.  ____________________________________________   PHYSICAL EXAM:  VITAL SIGNS: ED Triage Vitals  Enc Vitals Group     BP 05/12/15 0518  133/71 mmHg     Pulse Rate 05/12/15 0518 62     Resp 05/12/15 0518 18     Temp 05/12/15 0518 98 F (36.7 C)     Temp Source 05/12/15 0518 Oral     SpO2 05/12/15 0518 99 %     Weight 05/12/15 0518 140 lb (63.504 kg)     Height 05/12/15 0518 5\' 1"  (1.549 m)     Head Cir --      Peak Flow --      Pain Score 05/12/15 0518 7     Pain Loc --      Pain Edu? --      Excl. in GC? --      Constitutional: Alert and oriented. Well appearing and in no distress. Eyes: Conjunctivae are normal. PERRL. Normal extraocular movements. ENT   Head: Normocephalic and atraumatic.   Nose: No congestion/rhinnorhea.   Mouth/Throat: Mucous membranes are moist.   Neck: No stridor. Ears: Right TM dullness. No erythema noted Hematological/Lymphatic/Immunilogical: No cervical lymphadenopathy. Cardiovascular: Normal  rate, regular rhythm. Normal and symmetric distal pulses are present in all extremities. No murmurs, rubs, or gallops. Respiratory: Normal respiratory effort without tachypnea nor retractions. Breath sounds are clear and equal bilaterally. No wheezes/rales/rhonchi. Gastrointestinal: Soft and nontender. No distention. There is no CVA tenderness. Genitourinary: deferred Musculoskeletal: Nontender with normal range of motion in all extremities. No joint effusions.  No lower extremity tenderness nor edema. Neurologic:  Normal speech and language. No gross focal neurologic deficits are appreciated. Speech is normal.  Skin:  Skin is warm, dry and intact. No rash noted. Psychiatric: Mood and affect are normal. Speech and behavior are normal. Patient exhibits appropriate insight and judgment.   INITIAL IMPRESSION / ASSESSMENT AND PLAN / ED COURSE  Pertinent labs & imaging results that were available during my care of the patient were reviewed by me and considered in my medical decision making (see chart for details).  Given ongoing ear pain multiple ED visits for same we'll refer patient to ENT Dr. 13/01/16   FINAL CLINICAL IMPRESSION(S) / ED DIAGNOSES  Final diagnoses:  Otalgia of right ear      Andee Poles, MD 05/12/15 [phone]

## 2015-05-12 NOTE — ED Notes (Signed)
Patient ambulatory to triage with steady gait, without difficulty or distress noted; pt reports having ear pain, esp to right side; st has been seen here for same recently without relief

## 2015-05-12 NOTE — Discharge Instructions (Signed)
Earache An earache, also called otalgia, can be caused by many things. Pain from an earache can be sharp, dull, or burning. The pain may be temporary or constant. Earaches can be caused by problems with the ear, such as infection in either the middle ear or the ear canal, injury, impacted ear wax, middle ear pressure, or a foreign body in the ear. Ear pain can also result from problems in other areas. This is called referred pain. For example, pain can come from a sore throat, a tooth infection, or problems with the jaw or the joint between the jaw and the skull (temporomandibular joint, or TMJ). The cause of an earache is not always easy to identify. Watchful waiting may be appropriate for some earaches until a clear cause of the pain can be found. HOME CARE INSTRUCTIONS Watch your condition for any changes. The following actions may help to lessen any discomfort that you are feeling:  Take medicines only as directed by your health care provider. This includes ear drops.  Apply ice to your outer ear to help reduce pain.  Put ice in a plastic bag.  Place a towel between your skin and the bag.  Leave the ice on for 20 minutes, 2-3 times per day.  Do not put anything in your ear other than medicine that is prescribed by your health care provider.  Try resting in an upright position instead of lying down. This may help to reduce pressure in the middle ear and relieve pain.  Chew gum if it helps to relieve your ear pain.  Control any allergies that you have.  Keep all follow-up visits as directed by your health care provider. This is important. SEEK MEDICAL CARE IF:  Your pain does not improve within 2 days.  You have a fever.  You have new or worsening symptoms. SEEK IMMEDIATE MEDICAL CARE IF:  You have a severe headache.  You have a stiff neck.  You have difficulty swallowing.  You have redness or swelling behind your ear.  You have drainage from your ear.  You have hearing  loss.  You feel dizzy.   This information is not intended to replace advice given to you by your health care provider. Make sure you discuss any questions you have with your health care provider.   Document Released: 02/12/2004 Document Revised: 07/18/2014 Document Reviewed: 01/26/2014 Elsevier Interactive Patient Education 2016 Elsevier Inc.  

## 2015-05-27 ENCOUNTER — Encounter (HOSPITAL_COMMUNITY): Payer: Self-pay | Admitting: Emergency Medicine

## 2015-05-27 ENCOUNTER — Emergency Department (HOSPITAL_COMMUNITY)
Admission: EM | Admit: 2015-05-27 | Discharge: 2015-05-27 | Disposition: A | Discharge status: Home / Self Care | Payer: Self-pay | Attending: Emergency Medicine | Admitting: Emergency Medicine

## 2015-05-27 DIAGNOSIS — R0981 Nasal congestion: Secondary | ICD-10-CM | POA: Insufficient documentation

## 2015-05-27 DIAGNOSIS — R05 Cough: Secondary | ICD-10-CM | POA: Insufficient documentation

## 2015-05-27 DIAGNOSIS — Z794 Long term (current) use of insulin: Secondary | ICD-10-CM | POA: Insufficient documentation

## 2015-05-27 DIAGNOSIS — R197 Diarrhea, unspecified: Secondary | ICD-10-CM | POA: Insufficient documentation

## 2015-05-27 DIAGNOSIS — Z8739 Personal history of other diseases of the musculoskeletal system and connective tissue: Secondary | ICD-10-CM | POA: Insufficient documentation

## 2015-05-27 DIAGNOSIS — F17200 Nicotine dependence, unspecified, uncomplicated: Secondary | ICD-10-CM | POA: Insufficient documentation

## 2015-05-27 DIAGNOSIS — H6691 Otitis media, unspecified, right ear: Secondary | ICD-10-CM | POA: Insufficient documentation

## 2015-05-27 DIAGNOSIS — E119 Type 2 diabetes mellitus without complications: Secondary | ICD-10-CM | POA: Insufficient documentation

## 2015-05-27 DIAGNOSIS — R52 Pain, unspecified: Secondary | ICD-10-CM | POA: Insufficient documentation

## 2015-05-27 DIAGNOSIS — Z79899 Other long term (current) drug therapy: Secondary | ICD-10-CM | POA: Insufficient documentation

## 2015-05-27 HISTORY — DX: Rheumatoid arthritis, unspecified: M06.9

## 2015-05-27 MED ORDER — AMOXICILLIN-POT CLAVULANATE 875-125 MG PO TABS: 1.0000 | ORAL_TABLET | Freq: Two times a day (BID) | ORAL | Status: DC

## 2015-05-27 NOTE — ED Notes (Signed)
Pt is diabetic and has neuropathy. Has new hard spots developing on both great toes. Outer edges are blanched, center is dark brownish. Pt states they are increasingly painful.

## 2015-05-27 NOTE — ED Provider Notes (Signed)
CSN: [id number]     Arrival date & time 05/27/15  0131 History   First MD Initiated Contact with Patient 05/27/15 0155     Chief Complaint  Patient presents with  . Otalgia  . Nasal Congestion  . Cough  . Diarrhea  . Generalized Body Aches     (Consider location/radiation/quality/duration/timing/severity/associated sxs/prior Treatment) Patient is a 49 y.o. female presenting with ear pain. The history is provided by the patient. No language interpreter was used.  Otalgia Location:  Right Behind ear:  No abnormality Quality:  Aching Severity:  Severe Onset quality:  Gradual Duration:  2 weeks Timing:  Constant Progression:  Unchanged Chronicity:  New Context: not direct blow, not elevation change, not foreign body in ear, not loud noise and no water in ear   Relieved by:  Nothing Worsened by:  Nothing tried Ineffective treatments:  None tried Associated symptoms: sore throat   Associated symptoms: no abdominal pain, no congestion, no cough, no diarrhea, no fever, no hearing loss, no neck pain, no tinnitus and no vomiting   Risk factors: no recent travel, no chronic ear infection and no prior ear surgery     Past Medical History  Diagnosis Date  . Diabetes mellitus without complication (HCC)   . Neuropathy (HCC)   . Arthritis     rheumatoid  . RA (rheumatoid arthritis) [facility])    Past Surgical History  Procedure Laterality Date  . Knee surgery    . Abdominal hysterectomy    . Cesarean section    . Mandible fracture surgery    . Cholecystectomy     No family history on file. Social History  Substance Use Topics  . Smoking status: Current Every Day Smoker  . Smokeless tobacco: None  . Alcohol Use: No   OB History    No data available     Review of Systems  Constitutional: Negative for fever.  HENT: Positive for ear pain and sore throat. Negative for congestion, hearing loss and tinnitus.   Respiratory: Negative for cough.   Gastrointestinal: Negative for  vomiting, abdominal pain and diarrhea.  Musculoskeletal: Negative for neck pain.  All other systems reviewed and are negative.     Allergies  Ultram  Home Medications   Prior to Admission medications   Medication Sig Start Date End Date Taking? Authorizing Provider  benzonatate (TESSALON) 200 MG capsule Take 1 capsule (200 mg total) by mouth 3 (three) times daily as needed for cough. 04/28/15   [name], MD  cyclobenzaprine (FLEXERIL) 10 MG tablet Take 1 tablet (10 mg total) by mouth every 8 (eight) hours as needed for muscle spasms. 02/09/15   Jene Every, MD  insulin aspart protamine- aspart (NOVOLOG MIX 70/30) (70-30) 100 UNIT/ML injection Inject into the skin.    Historical Provider, MD  insulin glargine (LANTUS) 100 UNIT/ML injection Inject into the skin at bedtime.    Historical Provider, MD  ketorolac (TORADOL) 10 MG tablet Take 1 tablet (10 mg total) by mouth every 8 (eight) hours as needed. 03/17/15   [name], MD  ketorolac (TORADOL) 10 MG tablet Take 1 tablet (10 mg total) by mouth every 8 (eight) hours as needed. 05/12/15   [name], MD  lisinopril (PRINIVIL,ZESTRIL) 10 MG tablet Take 1 tablet (10 mg total) by mouth daily. 03/12/15 03/11/16  Darien Ramus, MD  LORazepam (ATIVAN) 0.5 MG tablet Take 1 tablet (0.5 mg total) by mouth every 8 (eight) hours as needed for anxiety. 03/12/15  Darien Ramus, MD  metFORMIN (GLUCOPHAGE) 1000 MG tablet Take 500 mg by mouth 2 (two) times daily with a meal.    Historical Provider, MD  methadone (DOLOPHINE) 5 MG/5ML solution Take 130 mg by mouth daily.    Historical Provider, MD   BP 156/70 mmHg  Pulse 62  Temp(Src) 97.6 F (36.4 C) (Oral)  Resp 16  Ht 5\' 1"  (1.549 m)  Wt 136 lb (61.689 kg)  BMI 25.71 kg/m2  SpO2 98% Physical Exam  Constitutional: She is oriented to person, place, and time. She appears well-developed and well-nourished. No distress.  HENT:  Head: Normocephalic and atraumatic.  Right Ear:  External ear normal.  Left Ear: External ear normal.  Mouth/Throat: Oropharynx is clear and moist. No oropharyngeal exudate.  Right ear tenderness with retraction of auricle. .   Eyes: Conjunctivae are normal.  Cardiovascular: Normal rate and regular rhythm.  Exam reveals no gallop and no friction rub.   No murmur heard. Pulmonary/Chest: Effort normal and breath sounds normal. She has no wheezes. She has no rales. She exhibits no tenderness.  Abdominal: Soft. She exhibits no distension. There is no tenderness.  Musculoskeletal: Normal range of motion.  Neurological: She is alert and oriented to person, place, and time. Coordination normal.  Speech is goal-oriented. Moves limbs without ataxia.   Skin: Skin is warm and dry.  Psychiatric: She has a normal mood and affect. Her behavior is normal.  Nursing note and vitals reviewed.   ED Course  Procedures (including critical care time) Labs Review Labs Reviewed - No data to display  Imaging Review No results found. I have personally reviewed and evaluated these images and lab results as part of my medical decision-making.   EKG Interpretation None      MDM   Final diagnoses:  Subacute otitis media of right ear, recurrence not specified, unspecified otitis media type    4:13 AM Patient will be treated with augmentin for otitis media on the right. Vitals stable and patient afebrile. Patient will be referred to ENT here. No further evaluation needed.    , PA-C 05/27/15 05/29/15  [id number], MD 05/27/15 [phone]

## 2015-05-27 NOTE — ED Notes (Signed)
Pt. presents with multiple complaints : Right ear ache for 1 month , nasal congestion , dry cough , runny nose generalized body aches , diarrhea and feet pain for several days .

## 2015-05-27 NOTE — Discharge Instructions (Signed)
Take augmentin as directed until gone. Refer to attached documents for more information. Follow up with the recommended ENT.

## 2015-07-30 ENCOUNTER — Ambulatory Visit: Payer: Self-pay

## 2015-08-15 ENCOUNTER — Encounter: Payer: Self-pay | Admitting: Emergency Medicine

## 2015-08-15 ENCOUNTER — Emergency Department: Payer: Self-pay

## 2015-08-15 ENCOUNTER — Emergency Department
Admission: EM | Admit: 2015-08-15 | Discharge: 2015-08-16 | Disposition: A | Discharge status: Home / Self Care | Payer: Self-pay | Attending: Emergency Medicine | Admitting: Emergency Medicine

## 2015-08-15 DIAGNOSIS — Z794 Long term (current) use of insulin: Secondary | ICD-10-CM | POA: Insufficient documentation

## 2015-08-15 DIAGNOSIS — F17200 Nicotine dependence, unspecified, uncomplicated: Secondary | ICD-10-CM | POA: Insufficient documentation

## 2015-08-15 DIAGNOSIS — W010XXA Fall on same level from slipping, tripping and stumbling without subsequent striking against object, initial encounter: Secondary | ICD-10-CM | POA: Insufficient documentation

## 2015-08-15 DIAGNOSIS — Z79899 Other long term (current) drug therapy: Secondary | ICD-10-CM | POA: Insufficient documentation

## 2015-08-15 DIAGNOSIS — S20211A Contusion of right front wall of thorax, initial encounter: Secondary | ICD-10-CM | POA: Insufficient documentation

## 2015-08-15 DIAGNOSIS — I10 Essential (primary) hypertension: Secondary | ICD-10-CM | POA: Insufficient documentation

## 2015-08-15 DIAGNOSIS — Y9389 Activity, other specified: Secondary | ICD-10-CM | POA: Insufficient documentation

## 2015-08-15 DIAGNOSIS — Y9289 Other specified places as the place of occurrence of the external cause: Secondary | ICD-10-CM | POA: Insufficient documentation

## 2015-08-15 DIAGNOSIS — Z792 Long term (current) use of antibiotics: Secondary | ICD-10-CM | POA: Insufficient documentation

## 2015-08-15 DIAGNOSIS — E119 Type 2 diabetes mellitus without complications: Secondary | ICD-10-CM | POA: Insufficient documentation

## 2015-08-15 DIAGNOSIS — Y998 Other external cause status: Secondary | ICD-10-CM | POA: Insufficient documentation

## 2015-08-15 HISTORY — DX: Essential (primary) hypertension: I10

## 2015-08-15 NOTE — ED Notes (Signed)
Patient refused EKG and labs

## 2015-08-15 NOTE — Discharge Instructions (Signed)
Rib Contusion A rib contusion is a deep bruise on your rib area. Contusions are the result of a blunt trauma that causes bleeding and injury to the tissues under the skin. A rib contusion may involve bruising of the ribs and of the skin and muscles in the area. The skin overlying the contusion may turn blue, purple, or yellow. Minor injuries will give you a painless contusion, but more severe contusions may stay painful and swollen for a few weeks. CAUSES  A contusion is usually caused by a blow, trauma, or direct force to an area of the body. This often occurs while playing contact sports. SYMPTOMS  Swelling and redness of the injured area.  Discoloration of the injured area.  Tenderness and soreness of the injured area.  Pain with or without movement. DIAGNOSIS  The diagnosis can be made by taking a medical history and performing a physical exam. An X-ray, CT scan, or MRI may be needed to determine if there were any associated injuries, such as broken bones (fractures) or internal injuries. TREATMENT  Often, the best treatment for a rib contusion is rest. Icing or applying cold compresses to the injured area may help reduce swelling and inflammation. Deep breathing exercises may be recommended to reduce the risk of partial lung collapse and pneumonia. Over-the-counter or prescription medicines may also be recommended for pain control. HOME CARE INSTRUCTIONS   Apply ice to the injured area:  Put ice in a plastic bag.  Place a towel between your skin and the bag.  Leave the ice on for 20 minutes, 2-3 times per day.  Take medicines only as directed by your health care provider.  Rest the injured area. Avoid strenuous activity and any activities or movements that cause pain. Be careful during activities and avoid bumping the injured area.  Perform deep-breathing exercises as directed by your health care provider.  Do not lift anything that is heavier than 5 lb (2.3 kg) until your  health care provider approves.  Do not use any tobacco products, including cigarettes, chewing tobacco, or electronic cigarettes. If you need help quitting, ask your health care provider. SEEK MEDICAL CARE IF:   You have increased bruising or swelling.  You have pain that is not controlled with treatment.  You have a fever. SEEK IMMEDIATE MEDICAL CARE IF:   You have difficulty breathing or shortness of breath.  You develop a continual cough, or you cough up thick or bloody sputum.  You feel sick to your stomach (nauseous), you throw up (vomit), or you have abdominal pain.   This information is not intended to replace advice given to you by your health care provider. Make sure you discuss any questions you have with your health care provider.   Document Released: 03/22/2001 Document Revised: 07/18/2014 Document Reviewed: 04/08/2014 Elsevier Interactive Patient Education 2016 [employer]  

## 2015-08-15 NOTE — ED Notes (Signed)
Pt states slipped and fell two weeks ago injuring right ribs. Pt states pain has continued. Pt states pain underneath right ribs and right upper quadrant. Pt states pain increases with inspiration and movement. Pt denies shob, cough, fever, nausea at present. Pt denies dizziness.

## 2015-08-16 NOTE — ED Provider Notes (Signed)
Time Seen: Approximately 2350  I have reviewed the triage notes  Chief Complaint: Rib Injury   History of Present Illness: Karen Lindsey is a 50 y.o. female who states that she tripped and fell landing primarily on her right chest wall region with her right arm tucked underneath her. Her fall which was non-syncopal occurred 2 weeks ago. She's noticed some persistent pain there that sharp and transient whenever she reaches or twist or turn especially while at work. Patient was concerned that she may have an underlying rib fracture. She denies any shortness of breath or significant abdominal pain. She denies any head trauma and neck thoracic or lumbar spine pain she has been ambulatory without difficulty. She denies any fever or chills or productive cough.   Past Medical History  Diagnosis Date  . Diabetes mellitus without complication (HCC)   . Neuropathy (HCC)   . Arthritis     rheumatoid  . RA (rheumatoid arthritis) (HCC)   . Hypertension     There are no active problems to display for this patient.   Past Surgical History  Procedure Laterality Date  . Knee surgery    . Abdominal hysterectomy    . Cesarean section    . Mandible fracture surgery    . Cholecystectomy      Past Surgical History  Procedure Laterality Date  . Knee surgery    . Abdominal hysterectomy    . Cesarean section    . Mandible fracture surgery    . Cholecystectomy      Current Outpatient Rx  Name  Route  Sig  Dispense  Refill  . amoxicillin-clavulanate (AUGMENTIN) 875-125 MG tablet   Oral   Take 1 tablet by mouth every 12 (twelve) hours.   14 tablet   0   . benzonatate (TESSALON) 200 MG capsule   Oral   Take 1 capsule (200 mg total) by mouth 3 (three) times daily as needed for cough.   20 capsule   0   . cyclobenzaprine (FLEXERIL) 10 MG tablet   Oral   Take 1 tablet (10 mg total) by mouth every 8 (eight) hours as needed for muscle spasms.   20 tablet   1   . insulin aspart  protamine- aspart (NOVOLOG MIX 70/30) (70-30) 100 UNIT/ML injection   Subcutaneous   Inject 20-30 Units into the skin 3 (three) times daily before meals.          . insulin glargine (LANTUS) 100 UNIT/ML injection   Subcutaneous   Inject 40 Units into the skin at bedtime.          Marland Kitchen ketorolac (TORADOL) 10 MG tablet   Oral   Take 1 tablet (10 mg total) by mouth every 8 (eight) hours as needed. Patient taking differently: Take 10 mg by mouth every 8 (eight) hours as needed for moderate pain.    20 tablet   0   . LORazepam (ATIVAN) 0.5 MG tablet   Oral   Take 1 tablet (0.5 mg total) by mouth every 8 (eight) hours as needed for anxiety.   10 tablet   0   . MAGNESIUM PO   Oral   Take 1 tablet by mouth daily.         . methadone (DOLOPHINE) 5 MG/5ML solution   Oral   Take 120 mg by mouth daily.          . Multiple Vitamin (MULTIVITAMIN WITH MINERALS) TABS tablet   Oral   Take  1 tablet by mouth daily.         Marland Kitchen POTASSIUM PO   Oral   Take 1 tablet by mouth daily.           Allergies:  Ultram  Family History: History reviewed. No pertinent family history.  Social History: Social History  Substance Use Topics  . Smoking status: Current Every Day Smoker  . Smokeless tobacco: Never Used  . Alcohol Use: No     Review of Systems:   10 point review of systems was performed and was otherwise negative:  Constitutional: No fever Eyes: No visual disturbances ENT: No sore throat, ear pain Cardiac: No chest pain Respiratory: No shortness of breath, wheezing, or stridor Abdomen: No abdominal pain, no vomiting, No diarrhea Endocrine: No weight loss, No night sweats Extremities: No peripheral edema, cyanosis Skin: No rashes, easy bruising Neurologic: No focal weakness, trouble with speech or swollowing Urologic: No dysuria, Hematuria, or urinary frequency   Physical Exam:  ED Triage Vitals  Enc Vitals Group     BP 08/15/15 2242 159/86 mmHg     Pulse Rate  08/15/15 2242 71     Resp 08/15/15 2242 16     Temp 08/15/15 2242 99 F (37.2 C)     Temp Source 08/15/15 2242 Oral     SpO2 08/15/15 2242 96 %     Weight 08/15/15 2242 137 lb (62.143 kg)     Height 08/15/15 2242 5\' 1"  (1.549 m)     Head Cir --      Peak Flow --      Pain Score 08/15/15 2243 5     Pain Loc --      Pain Edu? --      Excl. in GC? --     General: Awake , Alert , and Oriented times 3; GCS 15 Head: Normal cephalic , atraumatic Eyes: Pupils equal , round, reactive to light Nose/Throat: No nasal drainage, patent upper airway without erythema or exudate.  Neck: Supple, Full range of motion, No anterior adenopathy or palpable thyroid masses Lungs: Clear to ascultation without wheezes , rhonchi, or rales Heart: Regular rate, regular rhythm without murmurs , gallops , or rubs Abdomen: Soft, non tender without rebound, guarding , or rigidity; bowel sounds positive and symmetric in all 4 quadrants. No organomegaly .        Extremities: 2 plus symmetric pulses. No edema, clubbing or cyanosis Neurologic: normal ambulation, Motor symmetric without deficits, sensory intact Skin: warm, dry, no rashes Examination of the right chest wall area shows some tenderness just below the bra line on the right-hand side with some point tenderness. No crepitus or step-off is noted no external rashes or contusions are noted. Palpation here does reproduce the patient's pain.    Radiology:   CLINICAL DATA: Fall 2 weeks ago. Rib pain  EXAM: RIGHT RIBS AND CHEST - 3+ VIEW  COMPARISON: 04/29/2015  FINDINGS: No fracture or other bone lesions are seen involving the ribs. There is no evidence of pneumothorax or pleural effusion. Both lungs are clear. Heart size and mediastinal contours are within normal limits.  IMPRESSION: Negative.   Electronically Signed By: 05/01/2015 M.D. On: 08/15/2015 23:26   I personally reviewed the radiologic studies     ED Course:  * Patient's stay here was uneventful and she declined on any pain medication either injected or prescribed at this time. Patient states she is followed in the methadone clinic and she was advised to take ibuprofen  over-the-counter around the clock for pain. She was advised to return here if she develops any shortness of breath, nausea, fever, or any other new concerns. Patient was advised that not all x-rays will show small nondisplaced rib fractures and that we would treat her symptomatically for the same and that would be to continue with her methadone and her ibuprofen over-the-counter.    Assessment:  Right-sided chest wall contusion  Final Clinical Impression:   Final diagnoses:  Chest wall contusion, right, initial encounter     Plan: Outpatient management Patient was advised to return immediately if condition worsens. Patient was advised to follow up with their primary care physician or other specialized physicians involved in their outpatient care             Jennye Moccasin, MD 08/16/15 [phone]

## 2015-08-16 NOTE — ED Notes (Signed)
Pt. Going home and will follow up with Phineas Real in the next 4 days.

## 2015-08-31 DIAGNOSIS — F17200 Nicotine dependence, unspecified, uncomplicated: Secondary | ICD-10-CM | POA: Insufficient documentation

## 2015-08-31 DIAGNOSIS — E119 Type 2 diabetes mellitus without complications: Secondary | ICD-10-CM | POA: Insufficient documentation

## 2015-08-31 DIAGNOSIS — I10 Essential (primary) hypertension: Secondary | ICD-10-CM | POA: Insufficient documentation

## 2015-08-31 DIAGNOSIS — Z792 Long term (current) use of antibiotics: Secondary | ICD-10-CM | POA: Insufficient documentation

## 2015-08-31 DIAGNOSIS — Z79899 Other long term (current) drug therapy: Secondary | ICD-10-CM | POA: Insufficient documentation

## 2015-08-31 DIAGNOSIS — B349 Viral infection, unspecified: Secondary | ICD-10-CM | POA: Insufficient documentation

## 2015-08-31 DIAGNOSIS — Z794 Long term (current) use of insulin: Secondary | ICD-10-CM | POA: Insufficient documentation

## 2015-09-01 ENCOUNTER — Emergency Department
Admission: EM | Admit: 2015-09-01 | Discharge: 2015-09-01 | Disposition: A | Discharge status: Home / Self Care | Payer: Self-pay | Attending: Emergency Medicine | Admitting: Emergency Medicine

## 2015-09-01 ENCOUNTER — Emergency Department: Payer: Self-pay

## 2015-09-01 DIAGNOSIS — R059 Cough, unspecified: Secondary | ICD-10-CM

## 2015-09-01 DIAGNOSIS — B349 Viral infection, unspecified: Secondary | ICD-10-CM

## 2015-09-01 DIAGNOSIS — R05 Cough: Secondary | ICD-10-CM

## 2015-09-01 NOTE — ED Notes (Signed)
Pt is complaining of right ear pain.

## 2015-09-01 NOTE — ED Notes (Addendum)
Pt in with co ear fullness, sinus congestion, headache, no fever.  Recent dx of ear infection and bronchitis.

## 2015-09-01 NOTE — Discharge Instructions (Signed)
You have been seen in the Emergency Department (ED) today for a likely viral illness.  Please drink plenty of clear fluids (water, Gatorade, chicken broth, etc).  You may use Tylenol and/or Motrin according to label instructions.  You can alternate between the two without any side effects.  ° °Please follow up with your doctor as listed above. ° °Call your doctor or return to the Emergency Department (ED) if you are unable to tolerate fluids due to vomiting, have worsening trouble breathing, become extremely tired or difficult to awaken, or if you develop any other symptoms that concern you. ° ° °Viral Infections °A viral infection can be caused by different types of viruses. Most viral infections are not serious and resolve on their own. However, some infections may cause severe symptoms and may lead to further complications. °SYMPTOMS °Viruses can frequently cause: °· Minor sore throat. °· Aches and pains. °· Headaches. °· Runny nose. °· Different types of rashes. °· Watery eyes. °· Tiredness. °· Cough. °· Loss of appetite. °· Gastrointestinal infections, resulting in nausea, vomiting, and diarrhea. °These symptoms do not respond to antibiotics because the infection is not caused by bacteria. However, you might catch a bacterial infection following the viral infection. This is sometimes called a "superinfection." Symptoms of such a bacterial infection may include: °· Worsening sore throat with pus and difficulty swallowing. °· Swollen neck glands. °· Chills and a high or persistent fever. °· Severe headache. °· Tenderness over the sinuses. °· Persistent overall ill feeling (malaise), muscle aches, and tiredness (fatigue). °· Persistent cough. °· Yellow, green, or brown mucus production with coughing. °HOME CARE INSTRUCTIONS  °· Only take over-the-counter or prescription medicines for pain, discomfort, diarrhea, or fever as directed by your caregiver. °· Drink enough water and fluids to keep your urine clear or  pale yellow. Sports drinks can provide valuable electrolytes, sugars, and hydration. °· Get plenty of rest and maintain proper nutrition. Soups and broths with crackers or rice are fine. °SEEK IMMEDIATE MEDICAL CARE IF:  °· You have severe headaches, shortness of breath, chest pain, neck pain, or an unusual rash. °· You have uncontrolled vomiting, diarrhea, or you are unable to keep down fluids. °· You or your child has an oral temperature above 102° F (38.9° C), not controlled by medicine. °· Your baby is older than 3 months with a rectal temperature of 102° F (38.9° C) or higher. °· Your baby is 3 months old or younger with a rectal temperature of 100.4° F (38° C) or higher. °MAKE SURE YOU:  °· Understand these instructions. °· Will watch your condition. °· Will get help right away if you are not doing well or get worse. °  °This information is not intended to replace advice given to you by your health care provider. Make sure you discuss any questions you have with your health care provider. °  °Document Released: 04/06/2005 Document Revised: 09/19/2011 Document Reviewed: 12/03/2014 °Elsevier Interactive Patient Education ©2016 [employer] ° °

## 2015-09-01 NOTE — ED Provider Notes (Signed)
[facility] Emergency Department Provider Note  ____________________________________________  Time seen: Approximately 5:47 AM  I have reviewed the triage vital signs and the nursing notes.   HISTORY  Chief Complaint Nasal Congestion    HPI DARIN REDMANN is a 50 y.o. female who presents with gradual onset of right ear fullness, sinus congestion, mild headache, and occasional cough over the last several days.  She states it feels like when she has had a cold in the past.  She rates her symptoms as mild.  Nothing makes it better and nothing makes them worse.  She denies fever/chills, visual changes or disturbances, chest pain, shortness of breath, abdominal pain, nausea, vomiting, diarrhea, dysuria.She has been around several people that have had a viral infection recently.  She continues to smoke tobacco.   Past Medical History  Diagnosis Date  . Diabetes mellitus without complication (HCC)   . Neuropathy (HCC)   . Arthritis     rheumatoid  . RA (rheumatoid arthritis) (HCC)   . Hypertension     There are no active problems to display for this patient.   Past Surgical History  Procedure Laterality Date  . Knee surgery    . Abdominal hysterectomy    . Cesarean section    . Mandible fracture surgery    . Cholecystectomy      Current Outpatient Rx  Name  Route  Sig  Dispense  Refill  . amoxicillin-clavulanate (AUGMENTIN) 875-125 MG tablet   Oral   Take 1 tablet by mouth every 12 (twelve) hours.   14 tablet   0   . benzonatate (TESSALON) 200 MG capsule   Oral   Take 1 capsule (200 mg total) by mouth 3 (three) times daily as needed for cough.   20 capsule   0   . cyclobenzaprine (FLEXERIL) 10 MG tablet   Oral   Take 1 tablet (10 mg total) by mouth every 8 (eight) hours as needed for muscle spasms.   20 tablet   1   . insulin aspart protamine- aspart (NOVOLOG MIX 70/30) (70-30) 100 UNIT/ML injection   Subcutaneous   Inject 20-30 Units  into the skin 3 (three) times daily before meals.          . insulin glargine (LANTUS) 100 UNIT/ML injection   Subcutaneous   Inject 40 Units into the skin at bedtime.          Marland Kitchen ketorolac (TORADOL) 10 MG tablet   Oral   Take 1 tablet (10 mg total) by mouth every 8 (eight) hours as needed. Patient taking differently: Take 10 mg by mouth every 8 (eight) hours as needed for moderate pain.    20 tablet   0   . LORazepam (ATIVAN) 0.5 MG tablet   Oral   Take 1 tablet (0.5 mg total) by mouth every 8 (eight) hours as needed for anxiety.   10 tablet   0   . MAGNESIUM PO   Oral   Take 1 tablet by mouth daily.         . methadone (DOLOPHINE) 5 MG/5ML solution   Oral   Take 120 mg by mouth daily.          . Multiple Vitamin (MULTIVITAMIN WITH MINERALS) TABS tablet   Oral   Take 1 tablet by mouth daily.         Marland Kitchen POTASSIUM PO   Oral   Take 1 tablet by mouth daily.  Allergies Ultram  No family history on file.  Social History Social History  Substance Use Topics  . Smoking status: Current Every Day Smoker  . Smokeless tobacco: Never Used  . Alcohol Use: No    Review of Systems Constitutional: No fever/chills Eyes: No visual changes. ENT: Mild sore throat, nasal congestion and mild rhinorrhea.  "Fullness" in the ears. Cardiovascular: Denies chest pain. Respiratory: Mild cough, nonproductive Gastrointestinal: No abdominal pain.  No nausea, no vomiting.  No diarrhea.  No constipation. Genitourinary: Negative for dysuria. Musculoskeletal: Negative for back pain. Skin: Negative for rash. Neurological: Negative for headaches, focal weakness or numbness.  10-point ROS otherwise negative.  ____________________________________________   PHYSICAL EXAM:  VITAL SIGNS: ED Triage Vitals  Enc Vitals Group     BP 09/01/15 0016 147/66 mmHg     Pulse Rate 09/01/15 0016 61     Resp 09/01/15 0016 18     Temp 09/01/15 0016 97.9 F (36.6 C)     Temp  Source 09/01/15 0016 Oral     SpO2 09/01/15 0016 95 %     Weight 09/01/15 0016 140 lb (63.504 kg)     Height 09/01/15 0016 5\' 1"  (1.549 m)     Head Cir --      Peak Flow --      Pain Score 09/01/15 0017 5     Pain Loc --      Pain Edu? --      Excl. in GC? --     Constitutional: Alert and oriented. Well appearing and in no acute distress. Eyes: Conjunctivae are normal. PERRL. EOMI. Head: Atraumatic.  Ears are clear bilaterally with normal appearing tympanic membranes. Nose: Mild congestion/rhinnorhea. Mouth/Throat: Mucous membranes are moist.  Oropharynx non-erythematous. Neck: No stridor.  No meningeal signs Cardiovascular: Normal rate, regular rhythm. Grossly normal heart sounds.  Good peripheral circulation. Respiratory: Normal respiratory effort.  No retractions. Lungs CTAB. Gastrointestinal: Soft and nontender. No distention. No abdominal bruits. No CVA tenderness. Musculoskeletal: No lower extremity tenderness nor edema.  No joint effusions. Neurologic:  Normal speech and language. No gross focal neurologic deficits are appreciated.  Skin:  Skin is warm, dry and intact. No rash noted. Psychiatric: Mood and affect are normal. Speech and behavior are normal.  ____________________________________________   LABS (all labs ordered are listed, but only abnormal results are displayed)  Labs Reviewed - No data to display ____________________________________________  EKG  None ____________________________________________  RADIOLOGY   Dg Chest 2 View  09/01/2015  CLINICAL DATA:  Cough and congestion for 3 days. EXAM: CHEST  2 VIEW COMPARISON:  Chest and right rib radiographs 08/15/2015 FINDINGS: The cardiomediastinal contours are normal. The lungs are clear. Pulmonary vasculature is normal. No consolidation, pleural effusion, or pneumothorax. No acute osseous abnormalities are seen. IMPRESSION: No acute pulmonary process. Electronically Signed   By: 10/13/2015 M.D.   On:  09/01/2015 00:54    ____________________________________________   PROCEDURES  Procedure(s) performed: None  Critical Care performed: No ____________________________________________   INITIAL IMPRESSION / ASSESSMENT AND PLAN / ED COURSE  Pertinent labs & imaging results that were available during my care of the patient were reviewed by me and considered in my medical decision making (see chart for details).  The patient is very well-appearing with normal vital signs and afebrile.  She has signs and symptoms consistent with a mild respiratory virus.  There is no indication for further workup at this time nor further treatment.  I discussed conservative outpatient management with her  and recommended close outpatient follow-up.  She understands and agrees with the plan.  ____________________________________________  FINAL CLINICAL IMPRESSION(S) / ED DIAGNOSES  Final diagnoses:  Viral syndrome      NEW MEDICATIONS STARTED DURING THIS VISIT:  Discharge Medication List as of 09/01/2015  6:34 AM       Loleta Rose, MD 09/01/15 [phone]

## 2015-10-20 ENCOUNTER — Emergency Department: Payer: Self-pay

## 2015-10-20 ENCOUNTER — Emergency Department
Admission: EM | Admit: 2015-10-20 | Discharge: 2015-10-20 | Disposition: A | Discharge status: Home / Self Care | Payer: Self-pay | Attending: Emergency Medicine | Admitting: Emergency Medicine

## 2015-10-20 ENCOUNTER — Encounter: Payer: Self-pay | Admitting: Emergency Medicine

## 2015-10-20 DIAGNOSIS — X501XXA Overexertion from prolonged static or awkward postures, initial encounter: Secondary | ICD-10-CM | POA: Insufficient documentation

## 2015-10-20 DIAGNOSIS — Z79899 Other long term (current) drug therapy: Secondary | ICD-10-CM | POA: Insufficient documentation

## 2015-10-20 DIAGNOSIS — Y9301 Activity, walking, marching and hiking: Secondary | ICD-10-CM | POA: Insufficient documentation

## 2015-10-20 DIAGNOSIS — Y929 Unspecified place or not applicable: Secondary | ICD-10-CM | POA: Insufficient documentation

## 2015-10-20 DIAGNOSIS — S8981XA Other specified injuries of right lower leg, initial encounter: Secondary | ICD-10-CM | POA: Insufficient documentation

## 2015-10-20 DIAGNOSIS — S8391XA Sprain of unspecified site of right knee, initial encounter: Secondary | ICD-10-CM | POA: Insufficient documentation

## 2015-10-20 DIAGNOSIS — Y990 Civilian activity done for income or pay: Secondary | ICD-10-CM | POA: Insufficient documentation

## 2015-10-20 DIAGNOSIS — E119 Type 2 diabetes mellitus without complications: Secondary | ICD-10-CM | POA: Insufficient documentation

## 2015-10-20 DIAGNOSIS — Z794 Long term (current) use of insulin: Secondary | ICD-10-CM | POA: Insufficient documentation

## 2015-10-20 DIAGNOSIS — F17210 Nicotine dependence, cigarettes, uncomplicated: Secondary | ICD-10-CM | POA: Insufficient documentation

## 2015-10-20 DIAGNOSIS — I10 Essential (primary) hypertension: Secondary | ICD-10-CM | POA: Insufficient documentation

## 2015-10-20 DIAGNOSIS — Z7984 Long term (current) use of oral hypoglycemic drugs: Secondary | ICD-10-CM | POA: Insufficient documentation

## 2015-10-20 MED ORDER — BACLOFEN 10 MG PO TABS: 10.0000 mg | ORAL_TABLET | Freq: Three times a day (TID) | ORAL | Status: DC

## 2015-10-20 MED ORDER — MELOXICAM 15 MG PO TABS: 15.0000 mg | ORAL_TABLET | Freq: Every day | ORAL | Status: DC

## 2015-10-20 NOTE — ED Notes (Signed)
Patient presents to the ED with right ankle and knee pain after twisting her leg when walking.  Patient reports history of knee issues.  Patient denies falling.

## 2015-10-20 NOTE — ED Notes (Signed)
States she felt pain to right lower leg  States she twisted wrong  Pain radiates from post knee into right ankle/foot

## 2015-10-21 NOTE — ED Provider Notes (Signed)
CSN: [id number]     Arrival date & time 10/20/15  1614 History   First MD Initiated Contact with Patient 10/20/15 1646     Chief Complaint  Patient presents with  . Knee Pain  . Leg Pain     HPI   50 year old female who presents to the emergency department for evaluation of right leg pain. She states that while at work she turned to walk the other way and felt her knee pop. She now has pain in the medial side of her right knee that extends into the right ankle. Ambulation makes the pain worse. She has not taken anything for pain since the incident.  Past Medical History  Diagnosis Date  . Diabetes mellitus without complication (HCC)   . Neuropathy (HCC)   . Arthritis     rheumatoid  . RA (rheumatoid arthritis) (HCC)   . Hypertension    Past Surgical History  Procedure Laterality Date  . Knee surgery    . Abdominal hysterectomy    . Cesarean section    . Mandible fracture surgery    . Cholecystectomy     No family history on file. Social History  Substance Use Topics  . Smoking status: Current Every Day Smoker -- 0.50 packs/day    Types: Cigarettes  . Smokeless tobacco: Never Used  . Alcohol Use: No   OB History    No data available     Review of Systems  Constitutional: Negative.   Musculoskeletal: Positive for myalgias, arthralgias and gait problem. Negative for joint swelling.  Skin: Negative.   Neurological: Negative for weakness and numbness.      Allergies  Ultram  Home Medications   Prior to Admission medications   Medication Sig Start Date End Date Taking? Authorizing Provider  gabapentin (NEURONTIN) 300 MG capsule Take 300 mg by mouth 3 (three) times daily.   Yes Historical Provider, MD  metFORMIN (GLUCOPHAGE) 500 MG tablet Take 500 mg by mouth 2 (two) times daily with a meal.   Yes Historical Provider, MD  amoxicillin-clavulanate (AUGMENTIN) 875-125 MG tablet Take 1 tablet by mouth every 12 (twelve) hours. 05/27/15   Kaitlyn Szekalski, PA-C   baclofen (LIORESAL) 10 MG tablet Take 1 tablet (10 mg total) by mouth 3 (three) times daily. 10/20/15   Chinita Pester, FNP  benzonatate (TESSALON) 200 MG capsule Take 1 capsule (200 mg total) by mouth 3 (three) times daily as needed for cough. 04/28/15   [name], MD  cyclobenzaprine (FLEXERIL) 10 MG tablet Take 1 tablet (10 mg total) by mouth every 8 (eight) hours as needed for muscle spasms. 02/09/15   Jene Every, MD  insulin aspart protamine- aspart (NOVOLOG MIX 70/30) (70-30) 100 UNIT/ML injection Inject 20-30 Units into the skin 3 (three) times daily before meals.     Historical Provider, MD  insulin glargine (LANTUS) 100 UNIT/ML injection Inject 40 Units into the skin at bedtime.     Historical Provider, MD  ketorolac (TORADOL) 10 MG tablet Take 1 tablet (10 mg total) by mouth every 8 (eight) hours as needed. Patient taking differently: Take 10 mg by mouth every 8 (eight) hours as needed for moderate pain.  05/12/15   [name], MD  LORazepam (ATIVAN) 0.5 MG tablet Take 1 tablet (0.5 mg total) by mouth every 8 (eight) hours as needed for anxiety. 03/12/15   Darien Ramus, MD  MAGNESIUM PO Take 1 tablet by mouth daily.    Historical Provider, MD  meloxicam [facility])  15 MG tablet Take 1 tablet (15 mg total) by mouth daily. 10/20/15   Chinita Pester, FNP  methadone (DOLOPHINE) 5 MG/5ML solution Take 120 mg by mouth daily.     Historical Provider, MD  Multiple Vitamin (MULTIVITAMIN WITH MINERALS) TABS tablet Take 1 tablet by mouth daily.    Historical Provider, MD  POTASSIUM PO Take 1 tablet by mouth daily.    Historical Provider, MD   BP 153/61 mmHg  Pulse 62  Temp(Src) 98.2 F (36.8 C) (Oral)  Resp 16  Ht 5\' 1"  (1.549 m)  Wt 62.596 kg  BMI 26.09 kg/m2  SpO2 96% Physical Exam  Constitutional: She is oriented to person, place, and time. She appears well-developed and well-nourished.  Eyes: EOM are normal.  Neck: Neck supple.  Pulmonary/Chest: Effort normal.   Musculoskeletal:       Right knee: She exhibits decreased range of motion. She exhibits no ecchymosis, no deformity and no erythema. No tenderness found. No MCL tenderness noted.       Right ankle: She exhibits decreased range of motion. She exhibits no swelling, no ecchymosis, no deformity and normal pulse. Tenderness. AITFL tenderness found.  Neurological: She is alert and oriented to person, place, and time.  Skin: Skin is warm and dry. No erythema.  Psychiatric: Thought content normal.  Nursing note and vitals reviewed.   ED Course  Procedures (including critical care time) Labs Review Labs Reviewed - No data to display  Imaging Review Dg Tibia/fibula Right  10/20/2015  CLINICAL DATA:  Right ankle and right knee pain, twisted right leg while walking, medial pain EXAM: RIGHT TIBIA AND FIBULA - 2 VIEW COMPARISON:  04/23/2014 FINDINGS: Four views of the right tibia-fibula submitted. No acute fracture or subluxation. Again noted prior surgical changes tibial plateau. IMPRESSION: Negative. Electronically Signed   By: 04/25/2014 M.D.   On: 10/20/2015 17:10   I have personally reviewed and evaluated these images and lab results as part of my medical decision-making.   EKG Interpretation None      MDM   Final diagnoses:  Knee sprain and strain, right, initial encounter    Patient was placed in a knee immobilizer and encouraged to follow up with orthopedics. She is taking methadone and will not be given prescription for narcotic medications tonight. She was given a prescription for baclofen and meloxicam. She was encouraged to call tomorrow to schedule an appointment. She was advised to return to the emergency department for symptoms that change or worsen if she is unable to see orthopedics or her primary care provider.    12/20/2015, FNP 10/21/15 12/21/15  [id number], MD 10/28/15 [phone]

## 2015-10-27 ENCOUNTER — Emergency Department
Admission: EM | Admit: 2015-10-27 | Discharge: 2015-10-27 | Disposition: A | Discharge status: Home / Self Care | Payer: Self-pay | Attending: Emergency Medicine | Admitting: Emergency Medicine

## 2015-10-27 ENCOUNTER — Encounter: Payer: Self-pay | Admitting: Emergency Medicine

## 2015-10-27 DIAGNOSIS — F17210 Nicotine dependence, cigarettes, uncomplicated: Secondary | ICD-10-CM | POA: Insufficient documentation

## 2015-10-27 DIAGNOSIS — Z794 Long term (current) use of insulin: Secondary | ICD-10-CM | POA: Insufficient documentation

## 2015-10-27 DIAGNOSIS — R112 Nausea with vomiting, unspecified: Secondary | ICD-10-CM | POA: Insufficient documentation

## 2015-10-27 DIAGNOSIS — I10 Essential (primary) hypertension: Secondary | ICD-10-CM | POA: Insufficient documentation

## 2015-10-27 DIAGNOSIS — E119 Type 2 diabetes mellitus without complications: Secondary | ICD-10-CM | POA: Insufficient documentation

## 2015-10-27 DIAGNOSIS — Z7984 Long term (current) use of oral hypoglycemic drugs: Secondary | ICD-10-CM | POA: Insufficient documentation

## 2015-10-27 DIAGNOSIS — Z79899 Other long term (current) drug therapy: Secondary | ICD-10-CM | POA: Insufficient documentation

## 2015-10-27 LAB — CBC
HCT: 46.5 % (ref 35.0–47.0)
Hemoglobin: 15.3 g/dL (ref 12.0–16.0)
MCH: 28.4 pg (ref 26.0–34.0)
MCHC: 32.8 g/dL (ref 32.0–36.0)
MCV: 86.6 fL (ref 80.0–100.0)
PLATELETS: 152 10*3/uL (ref 150–440)
RBC: 5.37 MIL/uL — AB (ref 3.80–5.20)
RDW: 13.3 % (ref 11.5–14.5)
WBC: 7.6 10*3/uL (ref 3.6–11.0)

## 2015-10-27 LAB — BASIC METABOLIC PANEL
Anion gap: 10 (ref 5–15)
BUN: 19 mg/dL (ref 6–20)
CALCIUM: 8.6 mg/dL — AB (ref 8.9–10.3)
CHLORIDE: 98 mmol/L — AB (ref 101–111)
CO2: 29 mmol/L (ref 22–32)
CREATININE: 0.59 mg/dL (ref 0.44–1.00)
GFR calc non Af Amer: >60 mL/min (ref >60)
Glucose, Bld: 364 mg/dL — ABNORMAL HIGH (ref 65–99)
Potassium: 4.8 mmol/L (ref 3.5–5.1)
SODIUM: 137 mmol/L (ref 135–145)

## 2015-10-27 LAB — URINALYSIS COMPLETE WITH MICROSCOPIC (ARMC ONLY)
BILIRUBIN URINE: NEGATIVE
Bacteria, UA: NONE SEEN
Hgb urine dipstick: NEGATIVE
Leukocytes, UA: NEGATIVE
Nitrite: NEGATIVE
Protein, ur: NEGATIVE mg/dL
Specific Gravity, Urine: 1.037 — ABNORMAL HIGH (ref 1.005–1.030)
pH: 5 (ref 5.0–8.0)

## 2015-10-27 MED ORDER — ONDANSETRON HCL 4 MG PO TABS: 4.0000 mg | ORAL_TABLET | Freq: Three times a day (TID) | ORAL | Status: DC | PRN

## 2015-10-27 MED ORDER — SODIUM CHLORIDE 0.9 % IV BOLUS (SEPSIS): 1000.0000 mL | Freq: Once | INTRAVENOUS | Status: DC

## 2015-10-27 MED ORDER — ONDANSETRON 4 MG PO TBDP
4.0000 mg | ORAL_TABLET | Freq: Once | ORAL | Status: AC
  Administered 2015-10-27: 4 mg via ORAL

## 2015-10-27 MED ORDER — PROCHLORPERAZINE EDISYLATE 5 MG/ML IJ SOLN: 10.0000 mg | Freq: Once | INTRAMUSCULAR | Status: DC

## 2015-10-27 MED ORDER — ONDANSETRON 4 MG PO TBDP
ORAL_TABLET | ORAL | Status: AC
  Administered 2015-10-27: 4 mg via ORAL
  Filled 2015-10-27: qty 1

## 2015-10-27 NOTE — ED Provider Notes (Signed)
[facility] Emergency Department Provider Note   ____________________________________________  Time seen: ~2215  I have reviewed the triage vital signs and the nursing notes.   HISTORY  Chief Complaint Emesis   History limited by: Not Limited   HPI Karen Lindsey is a 50 y.o. female with history of diabetes who presents to the emergency department today because of concerns for nausea and vomiting. The patient states she started developing these symptoms yesterday. She has multiple episodes of nonbloody vomiting. She states that since that time she is continuing with the nausea. She is also developed an associated headache. She states she has had frequent bowel movements however nothing diarrhea like. Denies any abdominal pain. Denies any fevers.   Past Medical History  Diagnosis Date  . Diabetes mellitus without complication (HCC)   . Neuropathy (HCC)   . Arthritis     rheumatoid  . RA (rheumatoid arthritis) (HCC)   . Hypertension     There are no active problems to display for this patient.   Past Surgical History  Procedure Laterality Date  . Knee surgery    . Abdominal hysterectomy    . Cesarean section    . Mandible fracture surgery    . Cholecystectomy      Current Outpatient Rx  Name  Route  Sig  Dispense  Refill  . amoxicillin-clavulanate (AUGMENTIN) 875-125 MG tablet   Oral   Take 1 tablet by mouth every 12 (twelve) hours.   14 tablet   0   . baclofen (LIORESAL) 10 MG tablet   Oral   Take 1 tablet (10 mg total) by mouth 3 (three) times daily.   30 tablet   0   . benzonatate (TESSALON) 200 MG capsule   Oral   Take 1 capsule (200 mg total) by mouth 3 (three) times daily as needed for cough.   20 capsule   0   . cyclobenzaprine (FLEXERIL) 10 MG tablet   Oral   Take 1 tablet (10 mg total) by mouth every 8 (eight) hours as needed for muscle spasms.   20 tablet   1   . gabapentin (NEURONTIN) 300 MG capsule   Oral   Take  300 mg by mouth 3 (three) times daily.         . insulin aspart protamine- aspart (NOVOLOG MIX 70/30) (70-30) 100 UNIT/ML injection   Subcutaneous   Inject 20-30 Units into the skin 3 (three) times daily before meals.          . insulin glargine (LANTUS) 100 UNIT/ML injection   Subcutaneous   Inject 40 Units into the skin at bedtime.          Marland Kitchen ketorolac (TORADOL) 10 MG tablet   Oral   Take 1 tablet (10 mg total) by mouth every 8 (eight) hours as needed. Patient taking differently: Take 10 mg by mouth every 8 (eight) hours as needed for moderate pain.    20 tablet   0   . LORazepam (ATIVAN) 0.5 MG tablet   Oral   Take 1 tablet (0.5 mg total) by mouth every 8 (eight) hours as needed for anxiety.   10 tablet   0   . MAGNESIUM PO   Oral   Take 1 tablet by mouth daily.         . meloxicam (MOBIC) 15 MG tablet   Oral   Take 1 tablet (15 mg total) by mouth daily.   30 tablet   0   .  metFORMIN (GLUCOPHAGE) 500 MG tablet   Oral   Take 500 mg by mouth 2 (two) times daily with a meal.         . methadone (DOLOPHINE) 5 MG/5ML solution   Oral   Take 120 mg by mouth daily.          . Multiple Vitamin (MULTIVITAMIN WITH MINERALS) TABS tablet   Oral   Take 1 tablet by mouth daily.         . ondansetron (ZOFRAN) 4 MG tablet   Oral   Take 1 tablet (4 mg total) by mouth every 8 (eight) hours as needed for nausea or vomiting.   20 tablet   0   . POTASSIUM PO   Oral   Take 1 tablet by mouth daily.           Allergies Ultram  History reviewed. No pertinent family history.  Social History Social History  Substance Use Topics  . Smoking status: Current Every Day Smoker -- 0.50 packs/day    Types: Cigarettes  . Smokeless tobacco: Never Used  . Alcohol Use: No    Review of Systems  Constitutional: Negative for fever. Cardiovascular: Negative for chest pain. Respiratory: Negative for shortness of breath. Gastrointestinal: Negative for abdominal pain.  Positive for nausea and vomiting. Neurological: Negative for headaches, focal weakness or numbness.  10-point ROS otherwise negative.  ____________________________________________   PHYSICAL EXAM:  VITAL SIGNS: ED Triage Vitals  Enc Vitals Group     BP 10/27/15 1722 124/79 mmHg     Pulse Rate 10/27/15 1722 77     Resp 10/27/15 1722 18     Temp 10/27/15 1722 98.4 F (36.9 C)     Temp Source 10/27/15 1722 Oral     SpO2 10/27/15 1722 96 %     Weight 10/27/15 1722 140 lb (63.504 kg)     Height 10/27/15 1722 5\' 1"  (1.549 m)     Head Cir --      Peak Flow --      Pain Score 10/27/15 1743 6   Constitutional: Alert and oriented. Well appearing and in no distress. Eyes: Conjunctivae are normal. PERRL. Normal extraocular movements. ENT   Head: Normocephalic and atraumatic.   Nose: No congestion/rhinnorhea.   Mouth/Throat: Mucous membranes are moist.   Neck: No stridor. Hematological/Lymphatic/Immunilogical: No cervical lymphadenopathy. Cardiovascular: Normal rate, regular rhythm.  No murmurs, rubs, or gallops. Respiratory: Normal respiratory effort without tachypnea nor retractions. Breath sounds are clear and equal bilaterally. No wheezes/rales/rhonchi. Gastrointestinal: Soft and nontender. No distention.  Genitourinary: Deferred Musculoskeletal: Normal range of motion in all extremities. No joint effusions.  No lower extremity tenderness nor edema. Neurologic:  Normal speech and language. No gross focal neurologic deficits are appreciated.  Skin:  Skin is warm, dry and intact. No rash noted. Psychiatric: Mood and affect are normal. Speech and behavior are normal. Patient exhibits appropriate insight and judgment.  ____________________________________________    LABS (pertinent positives/negatives)  Labs Reviewed  BASIC METABOLIC PANEL - Abnormal; Notable for the following:    Chloride 98 (*)    Glucose, Bld 364 (*)    Calcium 8.6 (*)    All other components  within normal limits  CBC - Abnormal; Notable for the following:    RBC 5.37 (*)    All other components within normal limits  URINALYSIS COMPLETEWITH MICROSCOPIC ([facility] ONLY) - Abnormal; Notable for the following:    Color, Urine AMBER (*)    APPearance CLEAR (*)  Glucose, UA >500 (*)    Ketones, ur 1+ (*)    Specific Gravity, Urine 1.037 (*)    Squamous Epithelial / LPF 0-5 (*)    All other components within normal limits     ____________________________________________   EKG  None  ____________________________________________    RADIOLOGY  None  ____________________________________________   PROCEDURES  Procedure(s) performed: None  Critical Care performed: No  ____________________________________________   INITIAL IMPRESSION / ASSESSMENT AND PLAN / ED COURSE  Pertinent labs & imaging results that were available during my care of the patient were reviewed by me and considered in my medical decision making (see chart for details).  Patient presented to the emergency department today because of nausea and vomiting. Blood work without any leukocytosis. Abdominal exam is benign. Did offer to give patient fluids and nausea medication however she decided she would like to go home. Will give dose of nausea medication here and discharged with antiemetics.  ____________________________________________   FINAL CLINICAL IMPRESSION(S) / ED DIAGNOSES  Final diagnoses:  Nausea and vomiting, vomiting of unspecified type     Phineas Semen, MD 10/27/15 2311

## 2015-10-27 NOTE — ED Notes (Signed)
Pt states she became nauseous yesterday evening at work and headache. Denies abdominal pain. Pt states she hasn't eaten much. States she works 2 40hr jobs. States fever last night maybe. Pt states she was around neighbor a  lot who has bronchitis, states she got bronchitis a lot when she was younger.   MD Derrill Kay at bedside.

## 2015-10-27 NOTE — Discharge Instructions (Signed)
Please seek medical attention for any high fevers, chest pain, shortness of breath, change in behavior, persistent vomiting, bloody stool or any other new or concerning symptoms. ° ° °Nausea and Vomiting °Nausea is a sick feeling that often comes before throwing up (vomiting). Vomiting is a reflex where stomach contents come out of your mouth. Vomiting can cause severe loss of body fluids (dehydration). Children and elderly adults can become dehydrated quickly, especially if they also have diarrhea. Nausea and vomiting are symptoms of a condition or disease. It is important to find the cause of your symptoms. °CAUSES  °· Direct irritation of the stomach lining. This irritation can result from increased acid production (gastroesophageal reflux disease), infection, food poisoning, taking certain medicines (such as nonsteroidal anti-inflammatory drugs), alcohol use, or tobacco use. °· Signals from the brain. These signals could be caused by a headache, heat exposure, an inner ear disturbance, increased pressure in the brain from injury, infection, a tumor, or a concussion, pain, emotional stimulus, or metabolic problems. °· An obstruction in the gastrointestinal tract (bowel obstruction). °· Illnesses such as diabetes, hepatitis, gallbladder problems, appendicitis, kidney problems, cancer, sepsis, atypical symptoms of a heart attack, or eating disorders. °· Medical treatments such as chemotherapy and radiation. °· Receiving medicine that makes you sleep (general anesthetic) during surgery. °DIAGNOSIS °Your caregiver may ask for tests to be done if the problems do not improve after a few days. Tests may also be done if symptoms are severe or if the reason for the nausea and vomiting is not clear. Tests may include: °· Urine tests. °· Blood tests. °· Stool tests. °· Cultures (to look for evidence of infection). °· X-rays or other imaging studies. °Test results can help your caregiver make decisions about treatment or the  need for additional tests. °TREATMENT °You need to stay well hydrated. Drink frequently but in small amounts. You may wish to drink water, sports drinks, clear broth, or eat frozen ice pops or gelatin dessert to help stay hydrated. When you eat, eating slowly may help prevent nausea. There are also some antinausea medicines that may help prevent nausea. °HOME CARE INSTRUCTIONS  °· Take all medicine as directed by your caregiver. °· If you do not have an appetite, do not force yourself to eat. However, you must continue to drink fluids. °· If you have an appetite, eat a normal diet unless your caregiver tells you differently. °¨ Eat a variety of complex carbohydrates (rice, wheat, potatoes, bread), lean meats, yogurt, fruits, and vegetables. °¨ Avoid high-fat foods because they are more difficult to digest. °· Drink enough water and fluids to keep your urine clear or pale yellow. °· If you are dehydrated, ask your caregiver for specific rehydration instructions. Signs of dehydration may include: °¨ Severe thirst. °¨ Dry lips and mouth. °¨ Dizziness. °¨ Dark urine. °¨ Decreasing urine frequency and amount. °¨ Confusion. °¨ Rapid breathing or pulse. °SEEK IMMEDIATE MEDICAL CARE IF:  °· You have blood or brown flecks (like coffee grounds) in your vomit. °· You have black or bloody stools. °· You have a severe headache or stiff neck. °· You are confused. °· You have severe abdominal pain. °· You have chest pain or trouble breathing. °· You do not urinate at least once every 8 hours. °· You develop cold or clammy skin. °· You continue to vomit for longer than 24 to 48 hours. °· You have a fever. °MAKE SURE YOU:  °· Understand these instructions. °· Will watch your condition. °· Will get help right away   if you are not doing well or get worse. °  °This information is not intended to replace advice given to you by your health care provider. Make sure you discuss any questions you have with your health care provider. °    °Document Released: 06/27/2005 Document Revised: 09/19/2011 Document Reviewed: 11/24/2010 °Elsevier Interactive Patient Education ©2016 [employer] ° °

## 2015-10-27 NOTE — ED Notes (Signed)
Pt to ed with c/o weakness and vomiting and headache since yesterday.

## 2015-12-01 DIAGNOSIS — E1140 Type 2 diabetes mellitus with diabetic neuropathy, unspecified: Secondary | ICD-10-CM | POA: Insufficient documentation

## 2015-12-01 DIAGNOSIS — I10 Essential (primary) hypertension: Secondary | ICD-10-CM | POA: Insufficient documentation

## 2015-12-01 DIAGNOSIS — E1165 Type 2 diabetes mellitus with hyperglycemia: Secondary | ICD-10-CM | POA: Insufficient documentation

## 2015-12-01 DIAGNOSIS — Z792 Long term (current) use of antibiotics: Secondary | ICD-10-CM | POA: Insufficient documentation

## 2015-12-01 DIAGNOSIS — Z794 Long term (current) use of insulin: Secondary | ICD-10-CM | POA: Insufficient documentation

## 2015-12-01 DIAGNOSIS — M1990 Unspecified osteoarthritis, unspecified site: Secondary | ICD-10-CM | POA: Insufficient documentation

## 2015-12-01 DIAGNOSIS — M069 Rheumatoid arthritis, unspecified: Secondary | ICD-10-CM | POA: Insufficient documentation

## 2015-12-01 DIAGNOSIS — Z79899 Other long term (current) drug therapy: Secondary | ICD-10-CM | POA: Insufficient documentation

## 2015-12-01 DIAGNOSIS — F17210 Nicotine dependence, cigarettes, uncomplicated: Secondary | ICD-10-CM | POA: Insufficient documentation

## 2015-12-02 ENCOUNTER — Encounter: Payer: Self-pay | Admitting: Emergency Medicine

## 2015-12-02 ENCOUNTER — Emergency Department
Admission: EM | Admit: 2015-12-02 | Discharge: 2015-12-02 | Disposition: A | Discharge status: Home / Self Care | Payer: Self-pay | Attending: Emergency Medicine | Admitting: Emergency Medicine

## 2015-12-02 DIAGNOSIS — R42 Dizziness and giddiness: Secondary | ICD-10-CM

## 2015-12-02 DIAGNOSIS — R739 Hyperglycemia, unspecified: Secondary | ICD-10-CM

## 2015-12-02 LAB — CBC
HEMATOCRIT: 41.6 % (ref 35.0–47.0)
HEMOGLOBIN: 13.9 g/dL (ref 12.0–16.0)
MCH: 28.1 pg (ref 26.0–34.0)
MCHC: 33.3 g/dL (ref 32.0–36.0)
MCV: 84.2 fL (ref 80.0–100.0)
Platelets: 150 10*3/uL (ref 150–440)
RBC: 4.94 MIL/uL (ref 3.80–5.20)
RDW: 13.1 % (ref 11.5–14.5)
WBC: 10.2 10*3/uL (ref 3.6–11.0)

## 2015-12-02 LAB — URINALYSIS COMPLETE WITH MICROSCOPIC (ARMC ONLY)
BACTERIA UA: NONE SEEN
Bilirubin Urine: NEGATIVE
GLUCOSE, UA: 150 mg/dL — AB
Hgb urine dipstick: NEGATIVE
Ketones, ur: NEGATIVE mg/dL
Leukocytes, UA: NEGATIVE
Nitrite: NEGATIVE
PROTEIN: NEGATIVE mg/dL
Specific Gravity, Urine: 1.013 (ref 1.005–1.030)
pH: 5 (ref 5.0–8.0)

## 2015-12-02 LAB — BASIC METABOLIC PANEL
ANION GAP: 8 (ref 5–15)
BUN: 14 mg/dL (ref 6–20)
CALCIUM: 9 mg/dL (ref 8.9–10.3)
CO2: 29 mmol/L (ref 22–32)
Chloride: 98 mmol/L — ABNORMAL LOW (ref 101–111)
Creatinine, Ser: 0.51 mg/dL (ref 0.44–1.00)
GFR calc Af Amer: >60 mL/min (ref >60)
GFR calc non Af Amer: >60 mL/min (ref >60)
GLUCOSE: 297 mg/dL — AB (ref 65–99)
POTASSIUM: 3.7 mmol/L (ref 3.5–5.1)
Sodium: 135 mmol/L (ref 135–145)

## 2015-12-02 LAB — CK: CK TOTAL: 120 U/L (ref 38–234)

## 2015-12-02 LAB — GLUCOSE, CAPILLARY: GLUCOSE-CAPILLARY: 272 mg/dL — AB (ref 65–99)

## 2015-12-02 MED ORDER — SODIUM CHLORIDE 0.9 % IV BOLUS (SEPSIS)
1000.0000 mL | Freq: Once | INTRAVENOUS | Status: AC
  Administered 2015-12-02: 1000 mL via INTRAVENOUS

## 2015-12-02 MED ORDER — PROCHLORPERAZINE MALEATE 10 MG PO TABS
10.0000 mg | ORAL_TABLET | Freq: Once | ORAL | Status: AC
  Administered 2015-12-02: 10 mg via ORAL
  Filled 2015-12-02: qty 1

## 2015-12-02 NOTE — Discharge Instructions (Signed)
1. Drink plenty of fluids daily. 2. Return to the ER for worsening symptoms, persistent vomiting, difficulty breathing or other concerns.  Dizziness Dizziness is a common problem. It makes you feel unsteady or lightheaded. You may feel like you are about to pass out (faint). Dizziness can lead to injury if you stumble or fall. Anyone can get dizzy, but dizziness is more common in older adults. This condition can be caused by a number of things, including:  Medicines.  Dehydration.  Illness. HOME CARE Following these instructions may help with your condition: Eating and Drinking  Drink enough fluid to keep your pee (urine) clear or pale yellow. This helps to keep you from getting dehydrated. Try to drink more clear fluids, such as water.  Do not drink alcohol.  Limit how much caffeine you drink or eat if told by your doctor.  Limit how much salt you drink or eat if told by your doctor. Activity  Avoid making quick movements.  When you stand up from sitting in a chair, steady yourself until you feel okay.  In the morning, first sit up on the side of the bed. When you feel okay, stand slowly while you hold onto something. Do this until you know that your balance is fine.  Move your legs often if you need to stand in one place for a long time. Tighten and relax your muscles in your legs while you are standing.  Do not drive or use heavy machinery if you feel dizzy.  Avoid bending down if you feel dizzy. Place items in your home so that they are easy for you to reach without leaning over. Lifestyle  Do not use any tobacco products, including cigarettes, chewing tobacco, or electronic cigarettes. If you need help quitting, ask your doctor.  Try to lower your stress level, such as with yoga or meditation. Talk with your doctor if you need help. General Instructions  Watch your dizziness for any changes.  Take medicines only as told by your doctor. Talk with your doctor if you  think that your dizziness is caused by a medicine that you are taking.  Tell a friend or a family member that you are feeling dizzy. If he or she notices any changes in your behavior, have this person call your doctor.  Keep all follow-up visits as told by your doctor. This is important. GET HELP IF:  Your dizziness does not go away.  Your dizziness or light-headedness gets worse.  You feel sick to your stomach (nauseous).  You have trouble hearing.  You have new symptoms.  You are unsteady on your feet or you feel like the room is spinning. GET HELP RIGHT AWAY IF:  You throw up (vomit) or have diarrhea and are unable to eat or drink anything.  You have trouble:  Talking.  Walking.  Swallowing.  Using your arms, hands, or legs.  You feel generally weak.  You are not thinking clearly or you have trouble forming sentences. It may take a friend or family member to notice this.  You have:  Chest pain.  Pain in your belly (abdomen).  Shortness of breath.  Sweating.  Your vision changes.  You are bleeding.  You have a headache.  You have neck pain or a stiff neck.  You have a fever.   This information is not intended to replace advice given to you by your health care provider. Make sure you discuss any questions you have with your health care provider.  Document Released: 06/16/2011 Document Revised: 11/11/2014 Document Reviewed: 06/23/2014 Elsevier Interactive Patient Education 2016 [employer]  Hyperglycemia High blood sugar (hyperglycemia) means that the level of sugar in your blood is higher than it should be. Signs of high blood sugar include:  Feeling thirsty.  Frequent peeing (urinating).  Feeling tired or sleepy.  Dry mouth.  Vision changes.  Feeling weak.  Feeling hungry but losing weight.  Numbness and tingling in your hands or feet.  Headache. When you ignore these signs, your blood sugar may keep going up. These problems may  get worse, and other problems may begin. HOME CARE  Check your blood sugars as told by your doctor. Write down the numbers with the date and time.  Take the right amount of insulin or diabetes pills at the right time. Write down the dose with date and time.  Refill your insulin or diabetes pills before running out.  Watch what you eat. Follow your meal plan.  Drink liquids without sugar, such as water. Check with your doctor if you have kidney or heart disease.  Follow your doctor's orders for exercise. Exercise at the same time of day.  Keep your doctor's appointments. GET HELP RIGHT AWAY IF:   You have trouble thinking or are confused.  You have fast breathing with fruity smelling breath.  You pass out (faint).  You have 2 to 3 days of high blood sugars and you do not know why.  You have chest pain.  You are feeling sick to your stomach (nauseous) or throwing up (vomiting).  You have sudden vision changes. MAKE SURE YOU:   Understand these instructions.  Will watch your condition.  Will get help right away if you are not doing well or get worse.   This information is not intended to replace advice given to you by your health care provider. Make sure you discuss any questions you have with your health care provider.   Document Released: 04/24/2009 Document Revised: 07/18/2014 Document Reviewed: 03/03/2015 Elsevier Interactive Patient Education [employer]

## 2015-12-02 NOTE — ED Notes (Signed)
Pt presents to ED with c/o sudden onset of dizziness while at work around [employer] with nausea and headache. Denies vomiting. Also c/o bilateral leg pain. Hx of the same due to her diabetes. Pain prevents her from sleeping.

## 2015-12-02 NOTE — ED Provider Notes (Signed)
[facility] Emergency Department Provider Note   ____________________________________________  Time seen: Approximately 12:34 AM  I have reviewed the triage vital signs and the nursing notes.   HISTORY  Chief Complaint Dizziness    HPI Karen Lindsey is a 50 y.o. female who presents to the ED from work with a chief complaint of dizziness. Patient reports sudden onset of dizziness which she describes as a spinning sensation while at work prior to arrival while walking up the steps. Symptoms associated with headache and nausea. Denies vision changes, neck pain, chest pain, shortness of breath, abdominal pain, vomiting, diarrhea. Also complains of chronic bilateral leg paresthesias secondary to diabetic neuropathy for which she takes gabapentin. Patient stresses that she works 2 jobs and that she is exhausted. Denies recent travel or trauma. Nothing makes her symptoms better or worse. Currently patient's dizziness and nausea have resolved.   Past Medical History  Diagnosis Date  . Diabetes mellitus without complication (HCC)   . Neuropathy (HCC)   . Arthritis     rheumatoid  . RA (rheumatoid arthritis) (HCC)   . Hypertension     There are no active problems to display for this patient.   Past Surgical History  Procedure Laterality Date  . Knee surgery    . Abdominal hysterectomy    . Cesarean section    . Mandible fracture surgery    . Cholecystectomy      Current Outpatient Rx  Name  Route  Sig  Dispense  Refill  . amoxicillin-clavulanate (AUGMENTIN) 875-125 MG tablet   Oral   Take 1 tablet by mouth every 12 (twelve) hours.   14 tablet   0   . baclofen (LIORESAL) 10 MG tablet   Oral   Take 1 tablet (10 mg total) by mouth 3 (three) times daily.   30 tablet   0   . benzonatate (TESSALON) 200 MG capsule   Oral   Take 1 capsule (200 mg total) by mouth 3 (three) times daily as needed for cough.   20 capsule   0   . cyclobenzaprine  (FLEXERIL) 10 MG tablet   Oral   Take 1 tablet (10 mg total) by mouth every 8 (eight) hours as needed for muscle spasms.   20 tablet   1   . gabapentin (NEURONTIN) 300 MG capsule   Oral   Take 300 mg by mouth 3 (three) times daily.         . insulin aspart protamine- aspart (NOVOLOG MIX 70/30) (70-30) 100 UNIT/ML injection   Subcutaneous   Inject 20-30 Units into the skin 3 (three) times daily before meals.          . insulin glargine (LANTUS) 100 UNIT/ML injection   Subcutaneous   Inject 40 Units into the skin at bedtime.          Marland Kitchen ketorolac (TORADOL) 10 MG tablet   Oral   Take 1 tablet (10 mg total) by mouth every 8 (eight) hours as needed. Patient taking differently: Take 10 mg by mouth every 8 (eight) hours as needed for moderate pain.    20 tablet   0   . LORazepam (ATIVAN) 0.5 MG tablet   Oral   Take 1 tablet (0.5 mg total) by mouth every 8 (eight) hours as needed for anxiety.   10 tablet   0   . MAGNESIUM PO   Oral   Take 1 tablet by mouth daily.         Marland Kitchen  meloxicam (MOBIC) 15 MG tablet   Oral   Take 1 tablet (15 mg total) by mouth daily.   30 tablet   0   . metFORMIN (GLUCOPHAGE) 500 MG tablet   Oral   Take 500 mg by mouth 2 (two) times daily with a meal.         . methadone (DOLOPHINE) 5 MG/5ML solution   Oral   Take 120 mg by mouth daily.          . Multiple Vitamin (MULTIVITAMIN WITH MINERALS) TABS tablet   Oral   Take 1 tablet by mouth daily.         . ondansetron (ZOFRAN) 4 MG tablet   Oral   Take 1 tablet (4 mg total) by mouth every 8 (eight) hours as needed for nausea or vomiting.   20 tablet   0   . POTASSIUM PO   Oral   Take 1 tablet by mouth daily.           Allergies Ultram  No family history on file.  Social History Social History  Substance Use Topics  . Smoking status: Current Every Day Smoker -- 0.50 packs/day    Types: Cigarettes  . Smokeless tobacco: Never Used  . Alcohol Use: No    Review of  Systems  Constitutional: No fever/chills. Eyes: No visual changes. ENT: No sore throat. Cardiovascular: Denies chest pain. Respiratory: Denies shortness of breath. Gastrointestinal: No abdominal pain.  No nausea, no vomiting.  No diarrhea.  No constipation. Genitourinary: Negative for dysuria. Musculoskeletal: Negative for back pain. Skin: Negative for rash. Neurological: Positive for dizziness and headache. Negative for focal weakness or numbness.  10-point ROS otherwise negative.  ____________________________________________   PHYSICAL EXAM:  VITAL SIGNS: ED Triage Vitals  Enc Vitals Group     BP 12/02/15 0007 165/86 mmHg     Pulse Rate 12/02/15 0007 58     Resp 12/02/15 0007 18     Temp 12/02/15 0007 98.2 F (36.8 C)     Temp Source 12/02/15 0007 Oral     SpO2 12/02/15 0007 99 %     Weight 12/02/15 0007 140 lb (63.504 kg)     Height 12/02/15 0007 5\' 1"  (1.549 m)     Head Cir --      Peak Flow --      Pain Score 12/02/15 0007 7     Pain Loc --      Pain Edu? --      Excl. in GC? --     Constitutional: Alert and oriented. Well appearing and in no acute distress. Eyes: Conjunctivae are normal. PERRL. EOMI. Head: Atraumatic. Nose: No congestion/rhinnorhea. Mouth/Throat: Mucous membranes are moist.  Oropharynx non-erythematous. Neck: No stridor.  No carotid bruits. Cardiovascular: Normal rate, regular rhythm. Grossly normal heart sounds.  Good peripheral circulation. Respiratory: Normal respiratory effort.  No retractions. Lungs CTAB. Gastrointestinal: Soft and nontender. No distention. No abdominal bruits. No CVA tenderness. Musculoskeletal: No lower extremity tenderness nor edema.  No joint effusions. Neurologic:  Normal speech and language. No gross focal neurologic deficits are appreciated. No gait instability. Skin:  Skin is warm, dry and intact. No rash noted. Psychiatric: Mood and affect are normal. Speech and behavior are  normal.  ____________________________________________   LABS (all labs ordered are listed, but only abnormal results are displayed)  Labs Reviewed  BASIC METABOLIC PANEL - Abnormal; Notable for the following:    Chloride 98 (*)    Glucose, Bld 297 (*)  All other components within normal limits  URINALYSIS COMPLETEWITH MICROSCOPIC ([facility] ONLY) - Abnormal; Notable for the following:    Color, Urine YELLOW (*)    APPearance CLEAR (*)    Glucose, UA 150 (*)    Squamous Epithelial / LPF 0-5 (*)    All other components within normal limits  GLUCOSE, CAPILLARY - Abnormal; Notable for the following:    Glucose-Capillary 272 (*)    All other components within normal limits  CBC  CK  CBG MONITORING, ED   ____________________________________________  EKG  ED ECG REPORT I, SUNG,JADE J, the attending physician, personally viewed and interpreted this ECG.   Date: 12/02/2015  EKG Time: 0011  Rate: 54  Rhythm: sinus bradycardia  Axis: Normal  Intervals:none  ST&T Change: Nonspecific  ____________________________________________  RADIOLOGY  None ____________________________________________   PROCEDURES  Procedure(s) performed: None  Critical Care performed: No  ____________________________________________   INITIAL IMPRESSION / ASSESSMENT AND PLAN / ED COURSE  Pertinent labs & imaging results that were available during my care of the patient were reviewed by me and considered in my medical decision making (see chart for details).  50 year old female who presents with dizziness which she describes as spinning sensation. All symptoms have resolved upon her arrival to the emergency department. Will infuse IV fluids while awaiting screening lab work.  ----------------------------------------- 2:12 AM on 12/02/2015 -----------------------------------------  Updated patient and son of laboratory and urinalysis results. Advised patient to drink plenty of fluids daily.  Work note for 2 days. Strict return precautions given. Patient verbalizes understanding and agrees with plan of care. ____________________________________________   FINAL CLINICAL IMPRESSION(S) / ED DIAGNOSES  Final diagnoses:  Dizziness  Hyperglycemia      NEW MEDICATIONS STARTED DURING THIS VISIT:  New Prescriptions   No medications on file     Note:  This document was prepared using Dragon voice recognition software and may include unintentional dictation errors.    Irean Hong, MD 12/02/15 [phone]

## 2016-02-07 ENCOUNTER — Emergency Department: Payer: Self-pay

## 2016-02-07 ENCOUNTER — Encounter: Payer: Self-pay | Admitting: Emergency Medicine

## 2016-02-07 ENCOUNTER — Emergency Department
Admission: EM | Admit: 2016-02-07 | Discharge: 2016-02-07 | Disposition: A | Discharge status: Home / Self Care | Payer: Self-pay | Attending: Emergency Medicine | Admitting: Emergency Medicine

## 2016-02-07 DIAGNOSIS — G629 Polyneuropathy, unspecified: Secondary | ICD-10-CM

## 2016-02-07 DIAGNOSIS — I10 Essential (primary) hypertension: Secondary | ICD-10-CM | POA: Insufficient documentation

## 2016-02-07 DIAGNOSIS — E1142 Type 2 diabetes mellitus with diabetic polyneuropathy: Secondary | ICD-10-CM | POA: Insufficient documentation

## 2016-02-07 DIAGNOSIS — R1011 Right upper quadrant pain: Secondary | ICD-10-CM

## 2016-02-07 DIAGNOSIS — Z90710 Acquired absence of both cervix and uterus: Secondary | ICD-10-CM | POA: Insufficient documentation

## 2016-02-07 DIAGNOSIS — Z9049 Acquired absence of other specified parts of digestive tract: Secondary | ICD-10-CM | POA: Insufficient documentation

## 2016-02-07 DIAGNOSIS — F17210 Nicotine dependence, cigarettes, uncomplicated: Secondary | ICD-10-CM | POA: Insufficient documentation

## 2016-02-07 DIAGNOSIS — R739 Hyperglycemia, unspecified: Secondary | ICD-10-CM

## 2016-02-07 DIAGNOSIS — E1165 Type 2 diabetes mellitus with hyperglycemia: Secondary | ICD-10-CM | POA: Insufficient documentation

## 2016-02-07 DIAGNOSIS — Z794 Long term (current) use of insulin: Secondary | ICD-10-CM | POA: Insufficient documentation

## 2016-02-07 DIAGNOSIS — Z7984 Long term (current) use of oral hypoglycemic drugs: Secondary | ICD-10-CM | POA: Insufficient documentation

## 2016-02-07 LAB — CBC WITH DIFFERENTIAL/PLATELET
BASOS PCT: 1 %
Basophils Absolute: 0.1 10*3/uL (ref 0–0.1)
EOS PCT: 2 %
Eosinophils Absolute: 0.2 10*3/uL (ref 0–0.7)
HEMATOCRIT: 45.9 % (ref 35.0–47.0)
Hemoglobin: 15.3 g/dL (ref 12.0–16.0)
LYMPHS PCT: 32 %
Lymphs Abs: 3.2 10*3/uL (ref 1.0–3.6)
MCH: 29.1 pg (ref 26.0–34.0)
MCHC: 33.3 g/dL (ref 32.0–36.0)
MCV: 87.5 fL (ref 80.0–100.0)
MONO ABS: 0.8 10*3/uL (ref 0.2–0.9)
MONOS PCT: 8 %
NEUTROS ABS: 5.8 10*3/uL (ref 1.4–6.5)
Neutrophils Relative %: 57 %
PLATELETS: 153 10*3/uL (ref 150–440)
RBC: 5.25 MIL/uL — ABNORMAL HIGH (ref 3.80–5.20)
RDW: 13.5 % (ref 11.5–14.5)
WBC: 10.1 10*3/uL (ref 3.6–11.0)

## 2016-02-07 LAB — COMPREHENSIVE METABOLIC PANEL
ALT: 38 U/L (ref 14–54)
ANION GAP: 10 (ref 5–15)
AST: 27 U/L (ref 15–41)
Albumin: 4.1 g/dL (ref 3.5–5.0)
Alkaline Phosphatase: 97 U/L (ref 38–126)
BILIRUBIN TOTAL: 0.7 mg/dL (ref 0.3–1.2)
BUN: 16 mg/dL (ref 6–20)
CHLORIDE: 93 mmol/L — AB (ref 101–111)
CO2: 30 mmol/L (ref 22–32)
Calcium: 9.1 mg/dL (ref 8.9–10.3)
Creatinine, Ser: 0.6 mg/dL (ref 0.44–1.00)
GFR calc Af Amer: >60 mL/min (ref >60)
GFR calc non Af Amer: >60 mL/min (ref >60)
Glucose, Bld: 536 mg/dL (ref 65–99)
POTASSIUM: 3.7 mmol/L (ref 3.5–5.1)
Sodium: 133 mmol/L — ABNORMAL LOW (ref 135–145)
TOTAL PROTEIN: 7.3 g/dL (ref 6.5–8.1)

## 2016-02-07 LAB — LIPASE, BLOOD: LIPASE: 16 U/L (ref 11–51)

## 2016-02-07 MED ORDER — IBUPROFEN 600 MG PO TABS
600.0000 mg | ORAL_TABLET | Freq: Once | ORAL | Status: AC
  Administered 2016-02-07: 600 mg via ORAL
  Filled 2016-02-07: qty 1

## 2016-02-07 MED ORDER — SODIUM CHLORIDE 0.9 % IV BOLUS (SEPSIS)
1000.0000 mL | INTRAVENOUS | Status: AC
  Administered 2016-02-07: 1000 mL via INTRAVENOUS

## 2016-02-07 NOTE — ED Notes (Signed)
Report given to Jenna, RN

## 2016-02-07 NOTE — Discharge Instructions (Signed)
Your workup today was reassuring.  As we discussed, though your blood sugar is running high, it is not dangerous at this time.  Making adjustments in the Emergency Department (ED) and possibly causing your glucose level to drop too low is more dangerous than continuing your current medications at this time until you can follow up with your clinic doctor.  YOU MUST TAKE YOUR INSULIN AS PRESCRIBED AND STOP DRINKING SUGARY DRINKS!!  Please continue your medications and follow up with your regular doctor as recommended in these documents.  If you develop new or worsening symptoms that concern you, please return to the Emergency Department.

## 2016-02-07 NOTE — ED Triage Notes (Signed)
States neuropathy pain that her gabapentin is not helping. Had elevated liver enzymes 8 months ago and wants [place] to recheck since then the pain has not gotten better. Chronic joint pain from arthritis and getting worse.

## 2016-02-07 NOTE — ED Provider Notes (Signed)
[facility] Emergency Department Provider Note  ____________________________________________   First MD Initiated Contact with Patient 02/07/16 2015     (approximate)  I have reviewed the triage vital signs and the nursing notes.   HISTORY  Chief Complaint Peripheral Neuropathy and Abdominal Pain (rt side pain x greater than 8 months)    HPI Karen Lindsey is a 50 y.o. female who presents relatively frequently to the emergency department for a variety of complaintsand 2 has multiple chronic medical conditions such as diabetes that is poorly controlled, peripheral neuropathy, rheumatoid arthritis, and running pain of various parts of her body.  She presents today for evaluation of persistent and somewhat worsening peripheral neuropathy which has been gradual over a long period of time.  She also presents for evaluation of right-sided pain which is right under her rib cage and which she associates with her liver.  She has been told the past that she has fatty liver disease and in the past as an outpatient has had slightly elevated LFTs.  Her abdominal pain comes and goes, days at a time on and off, and has been doing so for months.  However now she feels like she is swollen and the pain waxes and wanes but is severe at maximal intensity.  She describes it as sharp and stabbing.  She denies fever/chills, chest pain, shortness of breath, vomiting, dysuria.  Moving makes the pain Worse and nothing makes it better.   Past Medical History:  Diagnosis Date  . Arthritis    rheumatoid  . Diabetes mellitus without complication (HCC)   . Hypertension   . Neuropathy (HCC)   . RA (rheumatoid arthritis) (HCC)     There are no active problems to display for this patient.   Past Surgical History:  Procedure Laterality Date  . ABDOMINAL HYSTERECTOMY    . CESAREAN SECTION    . CHOLECYSTECTOMY    . KNEE SURGERY    . MANDIBLE FRACTURE SURGERY      Prior to Admission  medications   Medication Sig Start Date End Date Taking? Authorizing Provider  amoxicillin-clavulanate (AUGMENTIN) 875-125 MG tablet Take 1 tablet by mouth every 12 (twelve) hours. 05/27/15   Kaitlyn Szekalski, PA-C  baclofen (LIORESAL) 10 MG tablet Take 1 tablet (10 mg total) by mouth 3 (three) times daily. 10/20/15   Chinita Pester, FNP  benzonatate (TESSALON) 200 MG capsule Take 1 capsule (200 mg total) by mouth 3 (three) times daily as needed for cough. 04/28/15   [name], MD  cyclobenzaprine (FLEXERIL) 10 MG tablet Take 1 tablet (10 mg total) by mouth every 8 (eight) hours as needed for muscle spasms. 02/09/15   Jene Every, MD  gabapentin (NEURONTIN) 300 MG capsule Take 300 mg by mouth 3 (three) times daily.    Historical Provider, MD  insulin aspart protamine- aspart (NOVOLOG MIX 70/30) (70-30) 100 UNIT/ML injection Inject 20-30 Units into the skin 3 (three) times daily before meals.     Historical Provider, MD  insulin glargine (LANTUS) 100 UNIT/ML injection Inject 40 Units into the skin at bedtime.     Historical Provider, MD  ketorolac (TORADOL) 10 MG tablet Take 1 tablet (10 mg total) by mouth every 8 (eight) hours as needed. Patient taking differently: Take 10 mg by mouth every 8 (eight) hours as needed for moderate pain.  05/12/15   [name], MD  LORazepam (ATIVAN) 0.5 MG tablet Take 1 tablet (0.5 mg total) by mouth every 8 (  eight) hours as needed for anxiety. 03/12/15   Darien Ramus, MD  MAGNESIUM PO Take 1 tablet by mouth daily.    Historical Provider, MD  meloxicam (MOBIC) 15 MG tablet Take 1 tablet (15 mg total) by mouth daily. 10/20/15   Chinita Pester, FNP  metFORMIN (GLUCOPHAGE) 500 MG tablet Take 500 mg by mouth 2 (two) times daily with a meal.    Historical Provider, MD  methadone (DOLOPHINE) 5 MG/5ML solution Take 120 mg by mouth daily.     Historical Provider, MD  Multiple Vitamin (MULTIVITAMIN WITH MINERALS) TABS tablet Take 1 tablet by mouth daily.     Historical Provider, MD  ondansetron (ZOFRAN) 4 MG tablet Take 1 tablet (4 mg total) by mouth every 8 (eight) hours as needed for nausea or vomiting. 10/27/15   Phineas Semen, MD  POTASSIUM PO Take 1 tablet by mouth daily.    Historical Provider, MD    Allergies Ultram [tramadol hcl]  History reviewed. No pertinent family history.  Social History Social History  Substance Use Topics  . Smoking status: Current Every Day Smoker    Packs/day: 0.50    Types: Cigarettes  . Smokeless tobacco: Never Used  . Alcohol use No    Review of Systems Constitutional: No fever/chills Eyes: No visual changes. ENT: No sore throat. Cardiovascular: Denies chest pain. Respiratory: Denies shortness of breath. Gastrointestinal: +RUQ abdominal pain.  No nausea, no vomiting.  No diarrhea.  No constipation. Genitourinary: Negative for dysuria. Musculoskeletal: Negative for back pain. Skin: Negative for rash. Neurological: Negative for headaches, focal weakness or numbness.  10-point ROS otherwise negative.  ____________________________________________   PHYSICAL EXAM:  VITAL SIGNS: ED Triage Vitals [02/07/16 1949]  Enc Vitals Group     BP      Pulse      Resp      Temp      Temp src      SpO2      Weight      Height      Head Circumference      Peak Flow      Pain Score 7     Pain Loc      Pain Edu?      Excl. in GC?     Constitutional: Alert and oriented. Well appearing and in no acute distress. Eyes: Conjunctivae are normal. PERRL. EOMI. Head: Atraumatic. Nose: No congestion/rhinnorhea. Mouth/Throat: Mucous membranes are moist.  Oropharynx non-erythematous. Neck: No stridor.  No meningeal signs.   Cardiovascular: Normal rate, regular rhythm. Good peripheral circulation. Grossly normal heart sounds.   Respiratory: Normal respiratory effort.  No retractions. Lungs CTAB. Gastrointestinal: Soft With tenderness to palpation of the right upper quadrant and what appears to be  palpable hepatomegaly. Musculoskeletal: No lower extremity tenderness nor edema. No gross deformities of extremities. Neurologic:  Normal speech and language. No gross focal neurologic deficits are appreciated.  Skin:  Skin is warm, dry and intact. No rash noted. Psychiatric: Mood and affect are normal. Speech and behavior are normal.  ____________________________________________   LABS (all labs ordered are listed, but only abnormal results are displayed)  Labs Reviewed  CBC WITH DIFFERENTIAL/PLATELET - Abnormal; Notable for the following:       Result Value   RBC 5.25 (*)    All other components within normal limits  COMPREHENSIVE METABOLIC PANEL - Abnormal; Notable for the following:    Sodium 133 (*)    Chloride 93 (*)    Glucose, Bld 536 (*)  All other components within normal limits  LIPASE, BLOOD   ____________________________________________  EKG  None ____________________________________________  RADIOLOGY   US Abdomen Limited Ruq  Result Date: 02/07/2016 CLINICAL DATA:  Right upper quadrant pain EXAM: US ABDOMEN LIMITED - RIGHT UPPER QUADRANT COMPARISON:  03/29/2014 FINDINGS: Gallbladder: Surgically removed. There is a focal hypoechoic area in the region of the gallbladder fossa. This may represent the cystic duct remnant. This is stable in appearance from the prior exam. Common bile duct: Diameter: 7 mm Liver: Slight increased echogenicity is noted likely related to fatty infiltration. Mild intrahepatic biliary dilatation is noted consistent with the post cholecystectomy state. Portal vein is widely patent. IMPRESSION: Status post cholecystectomy. Fatty infiltration of the liver. Patent portal vein. Electronically Signed   By: Alcide Clever M.D.   On: 02/07/2016 21:58   ____________________________________________   PROCEDURES  Procedure(s) performed:   Procedures   ____________________________________________   INITIAL IMPRESSION / ASSESSMENT AND PLAN /  ED COURSE  Pertinent labs & imaging results that were available during my care of the patient were reviewed by me and considered in my medical decision making (see chart for details).  Although the pain does sound chronic, is been worse recently and she has what appears to be palpable hepatomegaly.  Rare but important diagnoses that include portal vein thrombosis or even hepatorenal disease or possible.  She is relatively well-appearing though with stable vital signs.  I will check basic lab work including CBC, CMP, lipase, and I will obtain an ultrasound of her right upper quadrant to evaluate the liver and portal vein.  I will advance to a CT scan if necessary.  The patient is asking for some ibuprofen which she takes regularly and I feel that the probability of renal disease is low enough that it is okay for her to go ahead and have some ibuprofen while awaiting.  I explained to her that my options for treating her peripheral neuropathy are limited and that she has artery on gabapentin, but I reviewed her medical records and see that she was on venlafaxine in the past.  We can look possibly adding not back to her regimen.  She understands and agrees with the plan so far   Clinical Course  Value Comment By Time  Glucose: (!!) 536 I answered a phone call from the lab to inform us of a critical value of glucose 536.  I will start a liter of fluids after the patient gets back from ultrasound.  Her chemistry is otherwise unremarkable with a normal anion gap. Loleta Rose, MD 07/30 2103    (Note that documentation was delayed due to multiple ED patients requiring immediate care.)  Workup unremarkable except for hyperglycemia.  Gave 1L NS.  Discussed glucose with patient.  She admitted she frequently misses her insulin doses, and that she drinks sugary drinks every day.  I counseled her about medication compliance, avoiding sugary drinks and foods, and stressed importance of following up with PCP.  She  understands and agrees.  Will stick with gabapentin for now for neuropathy and let PCP guide therapy.  ____________________________________________  FINAL CLINICAL IMPRESSION(S) / ED DIAGNOSES  Final diagnoses:  Hyperglycemia  Right upper quadrant pain  Peripheral polyneuropathy (HCC)     MEDICATIONS GIVEN DURING THIS VISIT:  Medications  ibuprofen (ADVIL,MOTRIN) tablet 600 mg (600 mg Oral Given 02/07/16 2158)  sodium chloride 0.9 % bolus 1,000 mL (0 mLs Intravenous Stopped 02/07/16 2248)     NEW OUTPATIENT MEDICATIONS STARTED DURING THIS  VISIT:  Discharge Medication List as of 02/07/2016 10:25 PM        Note:  This document was prepared using Dragon voice recognition software and may include unintentional dictation errors.    Loleta Rose, MD 02/08/16 [phone]

## 2016-02-07 NOTE — ED Notes (Signed)

## 2016-02-15 ENCOUNTER — Emergency Department
Admission: EM | Admit: 2016-02-15 | Discharge: 2016-02-15 | Disposition: A | Discharge status: Home / Self Care | Payer: Self-pay | Attending: Student in an Organized Health Care Education/Training Program | Admitting: Student in an Organized Health Care Education/Training Program

## 2016-02-15 ENCOUNTER — Encounter: Payer: Self-pay | Admitting: Emergency Medicine

## 2016-02-15 DIAGNOSIS — E119 Type 2 diabetes mellitus without complications: Secondary | ICD-10-CM | POA: Insufficient documentation

## 2016-02-15 DIAGNOSIS — I10 Essential (primary) hypertension: Secondary | ICD-10-CM | POA: Insufficient documentation

## 2016-02-15 DIAGNOSIS — Z794 Long term (current) use of insulin: Secondary | ICD-10-CM | POA: Insufficient documentation

## 2016-02-15 DIAGNOSIS — F17210 Nicotine dependence, cigarettes, uncomplicated: Secondary | ICD-10-CM | POA: Insufficient documentation

## 2016-02-15 DIAGNOSIS — R197 Diarrhea, unspecified: Secondary | ICD-10-CM | POA: Insufficient documentation

## 2016-02-15 DIAGNOSIS — Z79899 Other long term (current) drug therapy: Secondary | ICD-10-CM | POA: Insufficient documentation

## 2016-02-15 LAB — URINALYSIS COMPLETE WITH MICROSCOPIC (ARMC ONLY)
Bacteria, UA: NONE SEEN
Bilirubin Urine: NEGATIVE
Glucose, UA: >500 mg/dL — AB
Hgb urine dipstick: NEGATIVE
KETONES UR: NEGATIVE mg/dL
Leukocytes, UA: NEGATIVE
NITRITE: NEGATIVE
PH: 5 (ref 5.0–8.0)
PROTEIN: NEGATIVE mg/dL
Specific Gravity, Urine: 1.023 (ref 1.005–1.030)

## 2016-02-15 LAB — COMPREHENSIVE METABOLIC PANEL
ALBUMIN: 3.6 g/dL (ref 3.5–5.0)
ALK PHOS: 90 U/L (ref 38–126)
ALT: 48 U/L (ref 14–54)
AST: 39 U/L (ref 15–41)
Anion gap: 6 (ref 5–15)
BILIRUBIN TOTAL: 0.4 mg/dL (ref 0.3–1.2)
BUN: 14 mg/dL (ref 6–20)
CALCIUM: 8.7 mg/dL — AB (ref 8.9–10.3)
CO2: 30 mmol/L (ref 22–32)
CREATININE: 0.57 mg/dL (ref 0.44–1.00)
Chloride: 98 mmol/L — ABNORMAL LOW (ref 101–111)
GFR calc Af Amer: >60 mL/min (ref >60)
GLUCOSE: 556 mg/dL — AB (ref 65–99)
Potassium: 3.7 mmol/L (ref 3.5–5.1)
Sodium: 134 mmol/L — ABNORMAL LOW (ref 135–145)
TOTAL PROTEIN: 6.7 g/dL (ref 6.5–8.1)

## 2016-02-15 LAB — CBC
HEMATOCRIT: 41.5 % (ref 35.0–47.0)
Hemoglobin: 14.1 g/dL (ref 12.0–16.0)
MCH: 28.9 pg (ref 26.0–34.0)
MCHC: 33.9 g/dL (ref 32.0–36.0)
MCV: 85.1 fL (ref 80.0–100.0)
PLATELETS: 148 10*3/uL — AB (ref 150–440)
RBC: 4.88 MIL/uL (ref 3.80–5.20)
RDW: 13.7 % (ref 11.5–14.5)
WBC: 8.7 10*3/uL (ref 3.6–11.0)

## 2016-02-15 LAB — LIPASE, BLOOD: Lipase: 15 U/L (ref 11–51)

## 2016-02-15 MED ORDER — PROMETHAZINE HCL 12.5 MG PO TABS: 12.5000 mg | ORAL_TABLET | Freq: Four times a day (QID) | ORAL | 0 refills | Status: DC | PRN

## 2016-02-15 MED ORDER — PROMETHAZINE HCL 25 MG PO TABS
25.0000 mg | ORAL_TABLET | Freq: Once | ORAL | Status: AC
  Administered 2016-02-15: 25 mg via ORAL
  Filled 2016-02-15: qty 1

## 2016-02-15 MED ORDER — HYDROCODONE-ACETAMINOPHEN 5-325 MG PO TABS
1.0000 | ORAL_TABLET | Freq: Once | ORAL | Status: AC
  Administered 2016-02-15: 1 via ORAL
  Filled 2016-02-15: qty 1

## 2016-02-15 NOTE — ED Provider Notes (Signed)
[facility] Emergency Department Provider Note    First MD Initiated Contact with Patient 02/15/16 1940     (approximate)  I have reviewed the triage vital signs and the nursing notes.   HISTORY  Chief Complaint Abdominal Pain    HPI Karen Lindsey is a 50 y.o. female who presents with 1 day of abdominal pain and loose diarrhea. Denies any blood in her stool. No recent fevers. She's had decreased ability to tolerate fluids by mouth. States that she's not had these symptoms before. No recent sick contacts. Denies any chest pain or shortness of breath. 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 She states she was trying to get seen at her primary care physician's clinic today for some antinausea medications and evaluation but they were unable to work her in.    Past Medical History:  Diagnosis Date  . Arthritis    rheumatoid  . Diabetes mellitus without complication (HCC)   . Hypertension   . Neuropathy (HCC)   . RA (rheumatoid arthritis) (HCC)     There are no active problems to display for this patient.   Past Surgical History:  Procedure Laterality Date  . ABDOMINAL HYSTERECTOMY    . CESAREAN SECTION    . CHOLECYSTECTOMY    . KNEE SURGERY    . MANDIBLE FRACTURE SURGERY      Prior to Admission medications   Medication Sig Start Date End Date Taking? Authorizing Provider  amoxicillin-clavulanate (AUGMENTIN) 875-125 MG tablet Take 1 tablet by mouth every 12 (twelve) hours. 05/27/15   Kaitlyn Szekalski, PA-C  baclofen (LIORESAL) 10 MG tablet Take 1 tablet (10 mg total) by mouth 3 (three) times daily. 10/20/15   Chinita Pester, FNP  benzonatate (TESSALON) 200 MG capsule Take 1 capsule (200 mg total) by mouth 3 (three) times daily as needed for cough. 04/28/15   [name], MD  cyclobenzaprine (FLEXERIL) 10 MG tablet Take 1 tablet (10 mg total) by mouth every 8 (eight) hours as needed for muscle spasms.  02/09/15   Jene Every, MD  gabapentin (NEURONTIN) 300 MG capsule Take 300 mg by mouth 3 (three) times daily.    Historical Provider, MD  insulin aspart protamine- aspart (NOVOLOG MIX 70/30) (70-30) 100 UNIT/ML injection Inject 20-30 Units into the skin 3 (three) times daily before meals.     Historical Provider, MD  insulin glargine (LANTUS) 100 UNIT/ML injection Inject 40 Units into the skin at bedtime.     Historical Provider, MD  ketorolac (TORADOL) 10 MG tablet Take 1 tablet (10 mg total) by mouth every 8 (eight) hours as needed. Patient taking differently: Take 10 mg by mouth every 8 (eight) hours as needed for moderate pain.  05/12/15   [name], MD  LORazepam (ATIVAN) 0.5 MG tablet Take 1 tablet (0.5 mg total) by mouth every 8 (eight) hours as needed for anxiety. 03/12/15   Darien Ramus, MD  MAGNESIUM PO Take 1 tablet by mouth daily.    Historical Provider, MD  meloxicam (MOBIC) 15 MG tablet Take 1 tablet (15 mg total) by mouth daily. 10/20/15   Chinita Pester, FNP  metFORMIN (GLUCOPHAGE) 500 MG tablet Take 500 mg by mouth 2 (two) times daily with a meal.    Historical Provider, MD  methadone (DOLOPHINE) 5 MG/5ML solution Take 120 mg by mouth daily.     Historical Provider, MD  Multiple Vitamin (MULTIVITAMIN WITH MINERALS) TABS tablet Take  1 tablet by mouth daily.    Historical Provider, MD  ondansetron (ZOFRAN) 4 MG tablet Take 1 tablet (4 mg total) by mouth every 8 (eight) hours as needed for nausea or vomiting. 10/27/15   Phineas Semen, MD  POTASSIUM PO Take 1 tablet by mouth daily.    Historical Provider, MD  promethazine (PHENERGAN) 12.5 MG tablet Take 1 tablet (12.5 mg total) by mouth every 6 (six) hours as needed for nausea or vomiting. 02/15/16   Willy Eddy, MD    Allergies Ultram Marcia Brash hcl]  No family history on file.  Social History Social History  Substance Use Topics  . Smoking status: Current Every Day Smoker    Packs/day: 0.50    Types: Cigarettes    . Smokeless tobacco: Never Used  . Alcohol use No    Review of Systems Patient denies headaches, rhinorrhea, blurry vision, numbness, shortness of breath, chest pain, edema, cough, abdominal pain, nausea, vomiting, diarrhea, dysuria, fevers, rashes or hallucinations unless otherwise stated above in HPI. ____________________________________________   PHYSICAL EXAM:  VITAL SIGNS: Vitals:   02/15/16 2005 02/15/16 2030  BP:  122/70  Pulse: 62 (!) 55    Constitutional: Alert and oriented. Well appearing and in no acute distress. Eyes: Conjunctivae are normal. PERRL. EOMI. Head: Atraumatic. Nose: No congestion/rhinnorhea. Mouth/Throat: Mucous membranes are moist.  Oropharynx non-erythematous. Neck: No stridor. Painless ROM. No cervical spine tenderness to palpation Hematological/Lymphatic/Immunilogical: No cervical lymphadenopathy. Cardiovascular: Normal rate, regular rhythm. Grossly normal heart sounds.  Good peripheral circulation. Respiratory: Normal respiratory effort.  No retractions. Lungs CTAB. Gastrointestinal: Soft and nontender. No distention.  Normoactive BS No abdominal bruits. No CVA tenderness. Musculoskeletal: No lower extremity tenderness nor edema.  No joint effusions. Neurologic:  Normal speech and language. No gross focal neurologic deficits are appreciated. No gait instability. Skin:  Skin is warm, dry and intact. No rash noted. Psychiatric: Mood and affect are normal. Speech and behavior are normal.  ____________________________________________   LABS (all labs ordered are listed, but only abnormal results are displayed)  Results for orders placed or performed during the hospital encounter of 02/15/16 (from the past 24 hour(s))  Lipase, blood     Status: None   Collection Time: 02/15/16  5:53 PM  Result Value Ref Range   Lipase 15 11 - 51 U/L  Comprehensive metabolic panel     Status: Abnormal   Collection Time: 02/15/16  5:53 PM  Result Value Ref Range    Sodium 134 (L) 135 - 145 mmol/L   Potassium 3.7 3.5 - 5.1 mmol/L   Chloride 98 (L) 101 - 111 mmol/L   CO2 30 22 - 32 mmol/L   Glucose, Bld 556 (HH) 65 - 99 mg/dL   BUN 14 6 - 20 mg/dL   Creatinine, Ser 1.09 0.44 - 1.00 mg/dL   Calcium 8.7 (L) 8.9 - 10.3 mg/dL   Total Protein 6.7 6.5 - 8.1 g/dL   Albumin 3.6 3.5 - 5.0 g/dL   AST 39 15 - 41 U/L   ALT 48 14 - 54 U/L   Alkaline Phosphatase 90 38 - 126 U/L   Total Bilirubin 0.4 0.3 - 1.2 mg/dL   GFR calc non Af Amer >60 >60 mL/min   GFR calc Af Amer >60 >60 mL/min   Anion gap 6 5 - 15  CBC     Status: Abnormal   Collection Time: 02/15/16  5:53 PM  Result Value Ref Range   WBC 8.7 3.6 - 11.0 K/uL  RBC 4.88 3.80 - 5.20 MIL/uL   Hemoglobin 14.1 12.0 - 16.0 g/dL   HCT 93.8 10.1 - 75.1 %   MCV 85.1 80.0 - 100.0 fL   MCH 28.9 26.0 - 34.0 pg   MCHC 33.9 32.0 - 36.0 g/dL   RDW 02.5 85.2 - 77.8 %   Platelets 148 (L) 150 - 440 K/uL  Urinalysis complete, with microscopic     Status: Abnormal   Collection Time: 02/15/16  5:53 PM  Result Value Ref Range   Color, Urine STRAW (A) YELLOW   APPearance CLEAR (A) CLEAR   Glucose, UA >500 (A) NEGATIVE mg/dL   Bilirubin Urine NEGATIVE NEGATIVE   Ketones, ur NEGATIVE NEGATIVE mg/dL   Specific Gravity, Urine 1.023 1.005 - 1.030   Hgb urine dipstick NEGATIVE NEGATIVE   pH 5.0 5.0 - 8.0   Protein, ur NEGATIVE NEGATIVE mg/dL   Nitrite NEGATIVE NEGATIVE   Leukocytes, UA NEGATIVE NEGATIVE   RBC / HPF 0-5 0 - 5 RBC/hpf   WBC, UA 0-5 0 - 5 WBC/hpf   Bacteria, UA NONE SEEN NONE SEEN   Squamous Epithelial / LPF 0-5 (A) NONE SEEN   Mucous PRESENT    ____________________________________________   ____________________________________________  RADIOLOGY   ____________________________________________   PROCEDURES  Procedure(s) performed: none    Critical Care performed: no ____________________________________________   INITIAL IMPRESSION / ASSESSMENT AND PLAN / ED COURSE  Pertinent  labs & imaging results that were available during my care of the patient were reviewed by me and considered in my medical decision making (see chart for details).  DDX: Pancreatitis, colitis, gastroenteritis, enteritis, UTI, dehydration  Karen Lindsey is a 50 y.o. who presents to the ED with chief complaint of one day of diarrheal illness and abdominal pain. She is afebrile and hemodynamically stable. Laboratory evaluation ordered due to concern for acute intra-abdominal process. Patient does not have a white count and blood work is otherwise unremarkable. No significant electrolyte abnormality. Based on her abdominal exam I do not feel that CT imaging is clinically indicated at this time. Patient is able to tolerate by mouth. I suspect her presentation is secondary to enteritis but encouraged the patient to return for repeat abdominal exam in 12-24 hours if symptoms not improved. She does not show any signs of significant dehydration and is able to tolerate fluids while in the ED. Patient was provided with by mouth pain management as well as Phenergan for the nausea.  Have discussed with the patient and available family all diagnostics and treatments performed thus far and all questions were answered to the best of my ability. The patient demonstrates understanding and agreement with plan.   Clinical Course     ____________________________________________   FINAL CLINICAL IMPRESSION(S) / ED DIAGNOSES  Final diagnoses:  Diarrhea, unspecified type      NEW MEDICATIONS STARTED DURING THIS VISIT:  Discharge Medication List as of 02/15/2016  8:07 PM    START taking these medications   Details  promethazine (PHENERGAN) 12.5 MG tablet Take 1 tablet (12.5 mg total) by mouth every 6 (six) hours as needed for nausea or vomiting., Starting Mon 02/15/2016, Print         Note:  This document was prepared using Dragon voice recognition software and may include unintentional dictation errors.       Willy Eddy, MD 02/15/16 2213

## 2016-02-15 NOTE — ED Notes (Signed)
Pt in via triage with complaints of abdominal pain and "just not feeling well" since Friday.  Pt reports diarrhea x 1 day, denies any N/V.  Pt A/Ox4, vitals WDL, no immediate distress at this time.

## 2016-02-15 NOTE — ED Triage Notes (Signed)
Pt presents with abd pain and diarrhea started last night.

## 2016-03-20 ENCOUNTER — Emergency Department
Admission: EM | Admit: 2016-03-20 | Discharge: 2016-03-20 | Disposition: A | Discharge status: Home / Self Care | Payer: Self-pay | Attending: Emergency Medicine | Admitting: Emergency Medicine

## 2016-03-20 ENCOUNTER — Encounter: Payer: Self-pay | Admitting: Emergency Medicine

## 2016-03-20 DIAGNOSIS — Z7984 Long term (current) use of oral hypoglycemic drugs: Secondary | ICD-10-CM | POA: Insufficient documentation

## 2016-03-20 DIAGNOSIS — E119 Type 2 diabetes mellitus without complications: Secondary | ICD-10-CM | POA: Insufficient documentation

## 2016-03-20 DIAGNOSIS — I10 Essential (primary) hypertension: Secondary | ICD-10-CM | POA: Insufficient documentation

## 2016-03-20 DIAGNOSIS — R002 Palpitations: Secondary | ICD-10-CM | POA: Insufficient documentation

## 2016-03-20 DIAGNOSIS — Z794 Long term (current) use of insulin: Secondary | ICD-10-CM | POA: Insufficient documentation

## 2016-03-20 DIAGNOSIS — F17210 Nicotine dependence, cigarettes, uncomplicated: Secondary | ICD-10-CM | POA: Insufficient documentation

## 2016-03-20 LAB — CBC
HCT: 44.2 % (ref 35.0–47.0)
HEMOGLOBIN: 15.1 g/dL (ref 12.0–16.0)
MCH: 28.9 pg (ref 26.0–34.0)
MCHC: 34.2 g/dL (ref 32.0–36.0)
MCV: 84.7 fL (ref 80.0–100.0)
Platelets: 181 10*3/uL (ref 150–440)
RBC: 5.22 MIL/uL — AB (ref 3.80–5.20)
RDW: 13.7 % (ref 11.5–14.5)
WBC: 11.9 10*3/uL — ABNORMAL HIGH (ref 3.6–11.0)

## 2016-03-20 LAB — TROPONIN I: Troponin I: <0.03 ng/mL (ref <0.03)

## 2016-03-20 LAB — URINALYSIS COMPLETE WITH MICROSCOPIC (ARMC ONLY)
BACTERIA UA: NONE SEEN
Bilirubin Urine: NEGATIVE
Glucose, UA: >500 mg/dL — AB
HGB URINE DIPSTICK: NEGATIVE
Ketones, ur: NEGATIVE mg/dL
LEUKOCYTES UA: NEGATIVE
NITRITE: NEGATIVE
PH: 6 (ref 5.0–8.0)
PROTEIN: NEGATIVE mg/dL
Specific Gravity, Urine: 1.03 (ref 1.005–1.030)

## 2016-03-20 LAB — BASIC METABOLIC PANEL
ANION GAP: 10 (ref 5–15)
BUN: 14 mg/dL (ref 6–20)
CALCIUM: 9.6 mg/dL (ref 8.9–10.3)
CHLORIDE: 93 mmol/L — AB (ref 101–111)
CO2: 33 mmol/L — AB (ref 22–32)
CREATININE: 0.64 mg/dL (ref 0.44–1.00)
GFR calc non Af Amer: >60 mL/min (ref >60)
GLUCOSE: 397 mg/dL — AB (ref 65–99)
Potassium: 4.1 mmol/L (ref 3.5–5.1)
Sodium: 136 mmol/L (ref 135–145)

## 2016-03-20 LAB — LIPASE, BLOOD: Lipase: 18 U/L (ref 11–51)

## 2016-03-20 NOTE — ED Notes (Signed)
Report to angela, rn. 

## 2016-03-20 NOTE — ED Provider Notes (Signed)
[facility] Emergency Department Provider Note   First MD Initiated Contact with Patient 03/20/16 [phone]     (approximate)  I have reviewed the triage vital signs and the nursing notes.   HISTORY  Chief Complaint Abdominal Pain and Nausea    HPI Karen Lindsey is a 50 y.o. female presents with history of sensation that her heart was racing and feeling faint yesterday evening at approximately 3:00 while having a bowel movement. Patient denies any chest pain no shortness of breath no dizziness at present. Patient states that she's had episodes of palpitations in the past for which she's been referred to a cardiologist at [facility].   Past Medical History:  Diagnosis Date  . Arthritis    rheumatoid  . Diabetes mellitus without complication (HCC)   . Hypertension   . Neuropathy (HCC)   . RA (rheumatoid arthritis) (HCC)     There are no active problems to display for this patient.   Past Surgical History:  Procedure Laterality Date  . ABDOMINAL HYSTERECTOMY    . CESAREAN SECTION    . CHOLECYSTECTOMY    . KNEE SURGERY    . MANDIBLE FRACTURE SURGERY      Prior to Admission medications   Medication Sig Start Date End Date Taking? Authorizing Provider  amoxicillin-clavulanate (AUGMENTIN) 875-125 MG tablet Take 1 tablet by mouth every 12 (twelve) hours. 05/27/15   Kaitlyn Szekalski, PA-C  baclofen (LIORESAL) 10 MG tablet Take 1 tablet (10 mg total) by mouth 3 (three) times daily. 10/20/15   Chinita Pester, FNP  benzonatate (TESSALON) 200 MG capsule Take 1 capsule (200 mg total) by mouth 3 (three) times daily as needed for cough. 04/28/15   [name], MD  cyclobenzaprine (FLEXERIL) 10 MG tablet Take 1 tablet (10 mg total) by mouth every 8 (eight) hours as needed for muscle spasms. 02/09/15   Jene Every, MD  gabapentin (NEURONTIN) 300 MG capsule Take 300 mg by mouth 3 (three) times daily.    Historical Provider, MD  insulin aspart protamine-  aspart (NOVOLOG MIX 70/30) (70-30) 100 UNIT/ML injection Inject 20-30 Units into the skin 3 (three) times daily before meals.     Historical Provider, MD  insulin glargine (LANTUS) 100 UNIT/ML injection Inject 40 Units into the skin at bedtime.     Historical Provider, MD  ketorolac (TORADOL) 10 MG tablet Take 1 tablet (10 mg total) by mouth every 8 (eight) hours as needed. Patient taking differently: Take 10 mg by mouth every 8 (eight) hours as needed for moderate pain.  05/12/15   [name], MD  LORazepam (ATIVAN) 0.5 MG tablet Take 1 tablet (0.5 mg total) by mouth every 8 (eight) hours as needed for anxiety. 03/12/15   Darien Ramus, MD  MAGNESIUM PO Take 1 tablet by mouth daily.    Historical Provider, MD  meloxicam (MOBIC) 15 MG tablet Take 1 tablet (15 mg total) by mouth daily. 10/20/15   Chinita Pester, FNP  metFORMIN (GLUCOPHAGE) 500 MG tablet Take 500 mg by mouth 2 (two) times daily with a meal.    Historical Provider, MD  methadone (DOLOPHINE) 5 MG/5ML solution Take 120 mg by mouth daily.     Historical Provider, MD  Multiple Vitamin (MULTIVITAMIN WITH MINERALS) TABS tablet Take 1 tablet by mouth daily.    Historical Provider, MD  ondansetron (ZOFRAN) 4 MG tablet Take 1 tablet (4 mg total) by mouth every 8 (eight) hours as needed for nausea  or vomiting. 10/27/15   Phineas Semen, MD  POTASSIUM PO Take 1 tablet by mouth daily.    Historical Provider, MD  promethazine (PHENERGAN) 12.5 MG tablet Take 1 tablet (12.5 mg total) by mouth every 6 (six) hours as needed for nausea or vomiting. 02/15/16   Willy Eddy, MD    Allergies Ultram Marcia Brash hcl]  No family history on file.  Social History Social History  Substance Use Topics  . Smoking status: Current Every Day Smoker    Packs/day: 0.50    Types: Cigarettes  . Smokeless tobacco: Never Used  . Alcohol use No    Review of Systems Constitutional: No fever/chills Eyes: No visual changes. ENT: No sore  throat. Cardiovascular: Denies chest pain.Positive for palpitations Respiratory: Denies shortness of breath. Gastrointestinal: No abdominal pain.  No nausea, no vomiting.  No diarrhea.  No constipation. Genitourinary: Negative for dysuria. Musculoskeletal: Negative for back pain. Skin: Negative for rash. Neurological: Negative for headaches, focal weakness or numbness. Positive for feeling lightheaded 10-point ROS otherwise negative.  ____________________________________________   PHYSICAL EXAM:  VITAL SIGNS: ED Triage Vitals  Enc Vitals Group     BP 03/20/16 0148 (!) 153/77     Pulse Rate 03/20/16 0148 87     Resp 03/20/16 0148 18     Temp 03/20/16 0148 98.2 F (36.8 C)     Temp Source 03/20/16 0148 Oral     SpO2 03/20/16 0148 99 %     Weight 03/20/16 0151 140 lb (63.5 kg)     Height 03/20/16 0151 5\' 1"  (1.549 m)     Head Circumference --      Peak Flow --      Pain Score --      Pain Loc --      Pain Edu? --      Excl. in GC? --     Constitutional: Alert and oriented. Well appearing and in no acute distress. Eyes: Conjunctivae are normal. PERRL. EOMI. Head: Atraumatic. Mouth/Throat: Mucous membranes are moist.  Oropharynx non-erythematous. Neck: No stridor.  No meningeal signs.  Cardiovascular: Normal rate, regular rhythm. Good peripheral circulation. Grossly normal heart sounds. Respiratory: Normal respiratory effort.  No retractions. Lungs CTAB. Gastrointestinal: Soft and nontender. No distention.   Musculoskeletal: No lower extremity tenderness nor edema. No gross deformities of extremities. Neurologic:  Normal speech and language. No gross focal neurologic deficits are appreciated.  Skin:  Skin is warm, dry and intact. No rash noted. Psychiatric: Mood and affect are normal. Speech and behavior are normal.  ____________________________________________   LABS (all labs ordered are listed, but only abnormal results are displayed)  Labs Reviewed  BASIC  METABOLIC PANEL - Abnormal; Notable for the following:       Result Value   Chloride 93 (*)    CO2 33 (*)    Glucose, Bld 397 (*)    All other components within normal limits  CBC - Abnormal; Notable for the following:    WBC 11.9 (*)    RBC 5.22 (*)    All other components within normal limits  URINALYSIS COMPLETEWITH MICROSCOPIC ([facility] ONLY) - Abnormal; Notable for the following:    Color, Urine YELLOW (*)    APPearance CLEAR (*)    Glucose, UA >500 (*)    Squamous Epithelial / LPF 0-5 (*)    All other components within normal limits  TROPONIN I  LIPASE, BLOOD  TROPONIN I   ____________________________________________  EKG  ED ECG REPORT I, [place] N BROWN,  the attending physician, personally viewed and interpreted this ECG.   Date: 03/20/2016  EKG Time: 1:55 AM  Rate: 73  Rhythm: Normal sinus rhythm  Axis: Normal  Intervals: Short PR interval  ST&T Change: None  ____________________________________________  RADIOLOGY I, [place] N BROWN, personally viewed and evaluated these images (plain radiographs) as part of my medical decision making, as well as reviewing the written report by the radiologist.**}  No results found.    Procedures     INITIAL IMPRESSION / ASSESSMENT AND PLAN / ED COURSE  Pertinent labs & imaging results that were available during my care of the patient were reviewed by me and considered in my medical decision making (see chart for details).  Patient with palpitation without any clear etiology consider possibly a vasovagal event given the patient was defecating at the time of the event. However given the fact that the patient has a previous history of palpitations with shortness of breath on exertion cardiac enzymes performed troponin was negative. Patient be referred to cardiology at [facility]   Clinical Course    ____________________________________________  FINAL CLINICAL IMPRESSION(S) / ED DIAGNOSES  Final diagnoses:  Heart  palpitations     MEDICATIONS GIVEN DURING THIS VISIT:  Medications - No data to display   NEW OUTPATIENT MEDICATIONS STARTED DURING THIS VISIT:  New Prescriptions   No medications on file    Modified Medications   No medications on file    Discontinued Medications   No medications on file     Note:  This document was prepared using Dragon voice recognition software and may include unintentional dictation errors.    [name], MD 03/20/16 [phone]

## 2016-03-20 NOTE — ED Notes (Signed)
Pt states last pm she felt "like I had to go to the bathroom, then while it was happening, I got real sweaty, felt really hot and started to have this pain right here." pt describing rlq pain. Pt states she had nausea, but no emesis. Pt states while "feeling hot I felt like my heart was racing, I know it was, but it's not now." pt with pwd skin, appears in no acute distress. Pt relates blood sugar was 477 before leaving house to come to ed.

## 2016-03-20 NOTE — ED Triage Notes (Signed)
Patient states that she has lower abdominal pain, nausea and felt like her heart was racing.

## 2016-05-02 ENCOUNTER — Emergency Department
Admission: EM | Admit: 2016-05-02 | Discharge: 2016-05-02 | Disposition: A | Discharge status: Home / Self Care | Payer: Self-pay | Attending: Emergency Medicine | Admitting: Emergency Medicine

## 2016-05-02 ENCOUNTER — Encounter: Payer: Self-pay | Admitting: *Deleted

## 2016-05-02 DIAGNOSIS — R739 Hyperglycemia, unspecified: Secondary | ICD-10-CM

## 2016-05-02 DIAGNOSIS — Z79899 Other long term (current) drug therapy: Secondary | ICD-10-CM | POA: Insufficient documentation

## 2016-05-02 DIAGNOSIS — F17210 Nicotine dependence, cigarettes, uncomplicated: Secondary | ICD-10-CM | POA: Insufficient documentation

## 2016-05-02 DIAGNOSIS — Z794 Long term (current) use of insulin: Secondary | ICD-10-CM | POA: Insufficient documentation

## 2016-05-02 DIAGNOSIS — Z791 Long term (current) use of non-steroidal anti-inflammatories (NSAID): Secondary | ICD-10-CM | POA: Insufficient documentation

## 2016-05-02 DIAGNOSIS — I10 Essential (primary) hypertension: Secondary | ICD-10-CM | POA: Insufficient documentation

## 2016-05-02 DIAGNOSIS — E1165 Type 2 diabetes mellitus with hyperglycemia: Secondary | ICD-10-CM | POA: Insufficient documentation

## 2016-05-02 DIAGNOSIS — Z792 Long term (current) use of antibiotics: Secondary | ICD-10-CM | POA: Insufficient documentation

## 2016-05-02 LAB — URINALYSIS COMPLETE WITH MICROSCOPIC (ARMC ONLY)
BACTERIA UA: NONE SEEN
Bilirubin Urine: NEGATIVE
Glucose, UA: >500 mg/dL — AB
Hgb urine dipstick: NEGATIVE
Ketones, ur: NEGATIVE mg/dL
LEUKOCYTES UA: NEGATIVE
Nitrite: NEGATIVE
PH: 6 (ref 5.0–8.0)
PROTEIN: NEGATIVE mg/dL
Specific Gravity, Urine: 1.025 (ref 1.005–1.030)
WBC, UA: NONE SEEN WBC/hpf (ref 0–5)

## 2016-05-02 LAB — COMPREHENSIVE METABOLIC PANEL
ALT: 30 U/L (ref 14–54)
AST: 24 U/L (ref 15–41)
Albumin: 3.6 g/dL (ref 3.5–5.0)
Alkaline Phosphatase: 68 U/L (ref 38–126)
Anion gap: 9 (ref 5–15)
BUN: 14 mg/dL (ref 6–20)
CHLORIDE: 94 mmol/L — AB (ref 101–111)
CO2: 30 mmol/L (ref 22–32)
Calcium: 8.4 mg/dL — ABNORMAL LOW (ref 8.9–10.3)
Creatinine, Ser: 0.67 mg/dL (ref 0.44–1.00)
GFR calc Af Amer: >60 mL/min (ref >60)
Glucose, Bld: 597 mg/dL (ref 65–99)
POTASSIUM: 4.1 mmol/L (ref 3.5–5.1)
SODIUM: 133 mmol/L — AB (ref 135–145)
Total Bilirubin: 0.5 mg/dL (ref 0.3–1.2)
Total Protein: 6.6 g/dL (ref 6.5–8.1)

## 2016-05-02 LAB — CBC
HEMATOCRIT: 40.9 % (ref 35.0–47.0)
Hemoglobin: 13.5 g/dL (ref 12.0–16.0)
MCH: 29.3 pg (ref 26.0–34.0)
MCHC: 33 g/dL (ref 32.0–36.0)
MCV: 88.7 fL (ref 80.0–100.0)
Platelets: 164 10*3/uL (ref 150–440)
RBC: 4.62 MIL/uL (ref 3.80–5.20)
RDW: 13.7 % (ref 11.5–14.5)
WBC: 9.4 10*3/uL (ref 3.6–11.0)

## 2016-05-02 LAB — GLUCOSE, CAPILLARY: Glucose-Capillary: 206 mg/dL — ABNORMAL HIGH (ref 65–99)

## 2016-05-02 LAB — LIPASE, BLOOD: LIPASE: 14 U/L (ref 11–51)

## 2016-05-02 MED ORDER — INSULIN NPH (HUMAN) (ISOPHANE) 100 UNIT/ML ~~LOC~~ SUSP: 40.0000 [IU] | Freq: Every day | SUBCUTANEOUS | 0 refills | Status: DC

## 2016-05-02 MED ORDER — SODIUM CHLORIDE 0.9 % IV BOLUS (SEPSIS)
1000.0000 mL | Freq: Once | INTRAVENOUS | Status: AC
  Administered 2016-05-02: 1000 mL via INTRAVENOUS

## 2016-05-02 MED ORDER — INSULIN ASPART 100 UNIT/ML ~~LOC~~ SOLN
10.0000 [IU] | Freq: Once | SUBCUTANEOUS | Status: AC
  Administered 2016-05-02: 10 [IU] via INTRAVENOUS
  Filled 2016-05-02: qty 10

## 2016-05-02 MED ORDER — ONDANSETRON HCL 4 MG/2ML IJ SOLN
4.0000 mg | Freq: Once | INTRAMUSCULAR | Status: AC
  Administered 2016-05-02: 4 mg via INTRAVENOUS
  Filled 2016-05-02: qty 2

## 2016-05-02 NOTE — ED Notes (Signed)
Lab called with a critical high glucose of 597. MD notified.

## 2016-05-02 NOTE — ED Triage Notes (Addendum)
Pt reports she has blurred vision and abd pain.  Hx diabetes.  Pt has n/v today.  Sx since yesterday.  Pt reports feeling weak.   Pt alert.  Speech clear.  Skin warm and dry.

## 2016-05-02 NOTE — ED Provider Notes (Signed)
[facility] Emergency Department Provider Note   ____________________________________________    I have reviewed the triage vital signs and the nursing notes.   HISTORY  Chief Complaint Blurred Vision and Abdominal Pain     HPI Karen Lindsey is a 50 y.o. female who presents with complaints of nausea, vomiting and mild diffuse abdominal discomfort. She also reports blurred vision. Patient reports a history of diabetes. She denies fevers or chills. No dysuria. She reports intermittent abdominal cramping but none currently. She reports compliance with her medications. She does have some financial difficulties at this time.   Past Medical History:  Diagnosis Date  . Arthritis    rheumatoid  . Diabetes mellitus without complication (HCC)   . Hypertension   . Neuropathy (HCC)   . RA (rheumatoid arthritis) (HCC)     There are no active problems to display for this patient.   Past Surgical History:  Procedure Laterality Date  . ABDOMINAL HYSTERECTOMY    . CESAREAN SECTION    . CHOLECYSTECTOMY    . KNEE SURGERY    . MANDIBLE FRACTURE SURGERY      Prior to Admission medications   Medication Sig Start Date End Date Taking? Authorizing Provider  amoxicillin-clavulanate (AUGMENTIN) 875-125 MG tablet Take 1 tablet by mouth every 12 (twelve) hours. 05/27/15   Kaitlyn Szekalski, PA-C  baclofen (LIORESAL) 10 MG tablet Take 1 tablet (10 mg total) by mouth 3 (three) times daily. 10/20/15   Chinita Pester, FNP  benzonatate (TESSALON) 200 MG capsule Take 1 capsule (200 mg total) by mouth 3 (three) times daily as needed for cough. 04/28/15   [name], MD  cyclobenzaprine (FLEXERIL) 10 MG tablet Take 1 tablet (10 mg total) by mouth every 8 (eight) hours as needed for muscle spasms. 02/09/15   Jene Every, MD  gabapentin (NEURONTIN) 300 MG capsule Take 300 mg by mouth 3 (three) times daily.    Historical Provider, MD  insulin aspart protamine- aspart  (NOVOLOG MIX 70/30) (70-30) 100 UNIT/ML injection Inject 20-30 Units into the skin 3 (three) times daily before meals.     Historical Provider, MD  insulin glargine (LANTUS) 100 UNIT/ML injection Inject 40 Units into the skin at bedtime.     Historical Provider, MD  ketorolac (TORADOL) 10 MG tablet Take 1 tablet (10 mg total) by mouth every 8 (eight) hours as needed. Patient taking differently: Take 10 mg by mouth every 8 (eight) hours as needed for moderate pain.  05/12/15   [name], MD  LORazepam (ATIVAN) 0.5 MG tablet Take 1 tablet (0.5 mg total) by mouth every 8 (eight) hours as needed for anxiety. 03/12/15   Darien Ramus, MD  MAGNESIUM PO Take 1 tablet by mouth daily.    Historical Provider, MD  meloxicam (MOBIC) 15 MG tablet Take 1 tablet (15 mg total) by mouth daily. 10/20/15   Chinita Pester, FNP  metFORMIN (GLUCOPHAGE) 500 MG tablet Take 500 mg by mouth 2 (two) times daily with a meal.    Historical Provider, MD  methadone (DOLOPHINE) 5 MG/5ML solution Take 120 mg by mouth daily.     Historical Provider, MD  Multiple Vitamin (MULTIVITAMIN WITH MINERALS) TABS tablet Take 1 tablet by mouth daily.    Historical Provider, MD  ondansetron (ZOFRAN) 4 MG tablet Take 1 tablet (4 mg total) by mouth every 8 (eight) hours as needed for nausea or vomiting. 10/27/15   Phineas Semen, MD  POTASSIUM PO Take  1 tablet by mouth daily.    Historical Provider, MD  promethazine (PHENERGAN) 12.5 MG tablet Take 1 tablet (12.5 mg total) by mouth every 6 (six) hours as needed for nausea or vomiting. 02/15/16   Willy Eddy, MD     Allergies Ultram Marcia Brash hcl]  No family history on file.  Social History Social History  Substance Use Topics  . Smoking status: Current Every Day Smoker    Packs/day: 0.50    Types: Cigarettes  . Smokeless tobacco: Never Used  . Alcohol use No    Review of Systems  Constitutional: No fever/chills Eyes: As above ENT: No sore throat. Cardiovascular: Denies  chest pain. Respiratory: Denies shortness of breath. Gastrointestinal: As above  Genitourinary: Negative for dysuria. No polyuria Musculoskeletal: Negative for back pain. Skin: Negative for rash. Neurological: Negative for headaches or weakness  10-point ROS otherwise negative.  ____________________________________________   PHYSICAL EXAM:  VITAL SIGNS: ED Triage Vitals  Enc Vitals Group     BP 05/02/16 2111 122/62     Pulse Rate 05/02/16 2111 (!) 56     Resp 05/02/16 2111 20     Temp 05/02/16 2111 98.4 F (36.9 C)     Temp Source 05/02/16 2111 Oral     SpO2 05/02/16 2111 96 %     Weight 05/02/16 2113 130 lb (59 kg)     Height 05/02/16 2113 5\' 1"  (1.549 m)     Head Circumference --      Peak Flow --      Pain Score 05/02/16 2113 5     Pain Loc --      Pain Edu? --      Excl. in GC? --     Constitutional: Alert and oriented. No acute distress.  Eyes: Conjunctivae are normal.   Nose: No congestion/rhinnorhea. Mouth/Throat: Mucous membranes are moist.    Cardiovascular: Normal rate, regular rhythm. Grossly normal heart sounds.  Good peripheral circulation. Respiratory: Normal respiratory effort.  No retractions. Lungs CTAB. Gastrointestinal: Soft and nontender. No distention.  No CVA tenderness. Genitourinary: deferred Musculoskeletal: No lower extremity tenderness nor edema.  Warm and well perfused Neurologic:  Normal speech and language. No gross focal neurologic deficits are appreciated.  Skin:  Skin is warm, dry and intact. No rash noted. Psychiatric: Mood and affect are normal. Speech and behavior are normal.  ____________________________________________   LABS (all labs ordered are listed, but only abnormal results are displayed)  Labs Reviewed  COMPREHENSIVE METABOLIC PANEL - Abnormal; Notable for the following:       Result Value   Sodium 133 (*)    Chloride 94 (*)    Glucose, Bld 597 (*)    Calcium 8.4 (*)    All other components within normal  limits  URINALYSIS COMPLETEWITH MICROSCOPIC ([facility] ONLY) - Abnormal; Notable for the following:    Color, Urine STRAW (*)    APPearance CLEAR (*)    Glucose, UA >500 (*)    Squamous Epithelial / LPF 0-5 (*)    All other components within normal limits  LIPASE, BLOOD  CBC   ____________________________________________  EKG  None ____________________________________________  RADIOLOGY  None ____________________________________________   PROCEDURES  Procedure(s) performed: No    Critical Care performed: No ____________________________________________   INITIAL IMPRESSION / ASSESSMENT AND PLAN / ED COURSE  Pertinent labs & imaging results that were available during my care of the patient were reviewed by me and considered in my medical decision making (see chart for details).  Patient  presents with blurry vision, vague abdominal cramping, I suspect hypoglycemia as a cause of this.  Clinical Course  Glucose returned at 597. Anion gap is normal, not consistent with DKA. We'll hydrate and give insulin and monitor carefully.  I have asked Dr. Manson Passey to recheck glucose and if improved I anticipate discharge ____________________________________________   FINAL CLINICAL IMPRESSION(S) / ED DIAGNOSES  Final diagnoses:  Hyperglycemia      NEW MEDICATIONS STARTED DURING THIS VISIT:  New Prescriptions   No medications on file     Note:  This document was prepared using Dragon voice recognition software and may include unintentional dictation errors.    Jene Every, MD 05/02/16 2227

## 2016-05-02 NOTE — ED Provider Notes (Signed)
I assumed care of the patient from Dr. Peter Minium 11:00 PM. Repeat glucose was 209 following his intervention.   [name], MD 05/02/16 [phone]

## 2016-05-14 ENCOUNTER — Encounter: Payer: Self-pay | Admitting: *Deleted

## 2016-05-14 ENCOUNTER — Emergency Department
Admission: EM | Admit: 2016-05-14 | Discharge: 2016-05-14 | Disposition: A | Discharge status: Home / Self Care | Payer: Self-pay | Attending: Emergency Medicine | Admitting: Emergency Medicine

## 2016-05-14 DIAGNOSIS — Z794 Long term (current) use of insulin: Secondary | ICD-10-CM | POA: Insufficient documentation

## 2016-05-14 DIAGNOSIS — R739 Hyperglycemia, unspecified: Secondary | ICD-10-CM

## 2016-05-14 DIAGNOSIS — E1165 Type 2 diabetes mellitus with hyperglycemia: Secondary | ICD-10-CM | POA: Insufficient documentation

## 2016-05-14 DIAGNOSIS — I10 Essential (primary) hypertension: Secondary | ICD-10-CM | POA: Insufficient documentation

## 2016-05-14 DIAGNOSIS — F17210 Nicotine dependence, cigarettes, uncomplicated: Secondary | ICD-10-CM | POA: Insufficient documentation

## 2016-05-14 LAB — GLUCOSE, CAPILLARY
GLUCOSE-CAPILLARY: 314 mg/dL — AB (ref 65–99)
GLUCOSE-CAPILLARY: 256 mg/dL — AB (ref 65–99)
Glucose-Capillary: 298 mg/dL — ABNORMAL HIGH (ref 65–99)
Glucose-Capillary: 350 mg/dL — ABNORMAL HIGH (ref 65–99)

## 2016-05-14 LAB — URINALYSIS COMPLETE WITH MICROSCOPIC (ARMC ONLY)
BILIRUBIN URINE: NEGATIVE
Bacteria, UA: NONE SEEN
Glucose, UA: >500 mg/dL — AB
Hgb urine dipstick: NEGATIVE
KETONES UR: NEGATIVE mg/dL
Leukocytes, UA: NEGATIVE
NITRITE: NEGATIVE
PH: 6 (ref 5.0–8.0)
PROTEIN: NEGATIVE mg/dL
RBC / HPF: NONE SEEN RBC/hpf (ref 0–5)
SPECIFIC GRAVITY, URINE: 1.031 — AB (ref 1.005–1.030)
Squamous Epithelial / LPF: NONE SEEN

## 2016-05-14 LAB — BASIC METABOLIC PANEL WITH GFR
Anion gap: 5 (ref 5–15)
BUN: 16 mg/dL (ref 6–20)
CO2: 28 mmol/L (ref 22–32)
Calcium: 8.4 mg/dL — ABNORMAL LOW (ref 8.9–10.3)
Chloride: 105 mmol/L (ref 101–111)
Creatinine, Ser: 0.65 mg/dL (ref 0.44–1.00)
GFR calc Af Amer: >60 mL/min
GFR calc non Af Amer: >60 mL/min
Glucose, Bld: 314 mg/dL — ABNORMAL HIGH (ref 65–99)
Potassium: 3.7 mmol/L (ref 3.5–5.1)
Sodium: 138 mmol/L (ref 135–145)

## 2016-05-14 LAB — CBC
HCT: 37.3 % (ref 35.0–47.0)
Hemoglobin: 12.9 g/dL (ref 12.0–16.0)
MCH: 29.6 pg (ref 26.0–34.0)
MCHC: 34.5 g/dL (ref 32.0–36.0)
MCV: 85.7 fL (ref 80.0–100.0)
PLATELETS: 140 10*3/uL — AB (ref 150–440)
RBC: 4.35 MIL/uL (ref 3.80–5.20)
RDW: 12.8 % (ref 11.5–14.5)
WBC: 8.4 10*3/uL (ref 3.6–11.0)

## 2016-05-14 MED ORDER — INSULIN ASPART 100 UNIT/ML ~~LOC~~ SOLN
6.0000 [IU] | Freq: Once | SUBCUTANEOUS | Status: AC
  Administered 2016-05-14: 6 [IU] via SUBCUTANEOUS
  Filled 2016-05-14: qty 6

## 2016-05-14 NOTE — ED Provider Notes (Signed)
[facility] Emergency Department Provider Note    First MD Initiated Contact with Patient 05/14/16 [phone]     (approximate)  I have reviewed the triage vital signs and the nursing notes.   HISTORY  Chief Complaint Hyperglycemia and Palpitations    HPI Karen Lindsey is a 50 y.o. female with type 2 diabetes hypertension presents to the emergency department with hyperglycemia. Patient states that her glucose was 400 on EMS arrival. Patient states she was getting ready for work and then became "felt tingly and hot, flushed.   Past Medical History:  Diagnosis Date  . Arthritis    rheumatoid  . Diabetes mellitus without complication (HCC)   . Hypertension   . Neuropathy (HCC)   . RA (rheumatoid arthritis) (HCC)     There are no active problems to display for this patient.   Past Surgical History:  Procedure Laterality Date  . ABDOMINAL HYSTERECTOMY    . CESAREAN SECTION    . CHOLECYSTECTOMY    . KNEE SURGERY    . MANDIBLE FRACTURE SURGERY      Prior to Admission medications   Medication Sig Start Date End Date Taking? Authorizing Provider  amoxicillin-clavulanate (AUGMENTIN) 875-125 MG tablet Take 1 tablet by mouth every 12 (twelve) hours. 05/27/15   Kaitlyn Szekalski, PA-C  baclofen (LIORESAL) 10 MG tablet Take 1 tablet (10 mg total) by mouth 3 (three) times daily. 10/20/15   Chinita Pester, FNP  benzonatate (TESSALON) 200 MG capsule Take 1 capsule (200 mg total) by mouth 3 (three) times daily as needed for cough. 04/28/15   [name], MD  cyclobenzaprine (FLEXERIL) 10 MG tablet Take 1 tablet (10 mg total) by mouth every 8 (eight) hours as needed for muscle spasms. 02/09/15   Jene Every, MD  gabapentin (NEURONTIN) 300 MG capsule Take 300 mg by mouth 3 (three) times daily.    Historical Provider, MD  insulin aspart protamine- aspart (NOVOLOG MIX 70/30) (70-30) 100 UNIT/ML injection Inject 20-30 Units into the skin 3 (three) times daily before  meals.     Historical Provider, MD  insulin glargine (LANTUS) 100 UNIT/ML injection Inject 40 Units into the skin at bedtime.     Historical Provider, MD  insulin NPH Human (HUMULIN N) 100 UNIT/ML injection Inject 0.4 mLs (40 Units total) into the skin at bedtime. 05/02/16   [name], MD  ketorolac (TORADOL) 10 MG tablet Take 1 tablet (10 mg total) by mouth every 8 (eight) hours as needed. Patient taking differently: Take 10 mg by mouth every 8 (eight) hours as needed for moderate pain.  05/12/15   [name], MD  LORazepam (ATIVAN) 0.5 MG tablet Take 1 tablet (0.5 mg total) by mouth every 8 (eight) hours as needed for anxiety. 03/12/15   Darien Ramus, MD  MAGNESIUM PO Take 1 tablet by mouth daily.    Historical Provider, MD  meloxicam (MOBIC) 15 MG tablet Take 1 tablet (15 mg total) by mouth daily. 10/20/15   Chinita Pester, FNP  metFORMIN (GLUCOPHAGE) 500 MG tablet Take 500 mg by mouth 2 (two) times daily with a meal.    Historical Provider, MD  methadone (DOLOPHINE) 5 MG/5ML solution Take 120 mg by mouth daily.     Historical Provider, MD  Multiple Vitamin (MULTIVITAMIN WITH MINERALS) TABS tablet Take 1 tablet by mouth daily.    Historical Provider, MD  ondansetron (ZOFRAN) 4 MG tablet Take 1 tablet (4 mg total) by mouth every  8 (eight) hours as needed for nausea or vomiting. 10/27/15   Phineas Semen, MD  POTASSIUM PO Take 1 tablet by mouth daily.    Historical Provider, MD  promethazine (PHENERGAN) 12.5 MG tablet Take 1 tablet (12.5 mg total) by mouth every 6 (six) hours as needed for nausea or vomiting. 02/15/16   Willy Eddy, MD    Allergies Ultram Marcia Brash hcl]  History reviewed. No pertinent family history.  Social History Social History  Substance Use Topics  . Smoking status: Current Every Day Smoker    Packs/day: 0.50    Types: Cigarettes  . Smokeless tobacco: Never Used  . Alcohol use No    Review of Systems Constitutional: No fever/chills Eyes: No  visual changes. ENT: No sore throat. Cardiovascular: Denies chest pain. Respiratory: Denies shortness of breath. Gastrointestinal: No abdominal pain.  No nausea, no vomiting.  No diarrhea.  No constipation. Genitourinary: Negative for dysuria. Musculoskeletal: Negative for back pain. Skin: Negative for rash. Neurological: Negative for headaches, focal weakness or numbness.  10-point ROS otherwise negative.  ____________________________________________   PHYSICAL EXAM:  VITAL SIGNS: ED Triage Vitals  Enc Vitals Group     BP 05/14/16 0025 (!) 150/70     Pulse Rate 05/14/16 0025 69     Resp 05/14/16 0025 18     Temp 05/14/16 0025 98.4 F (36.9 C)     Temp Source 05/14/16 0025 Oral     SpO2 05/14/16 0025 97 %     Weight 05/14/16 0026 144 lb (65.3 kg)     Height 05/14/16 0026 5\' 1"  (1.549 m)     Head Circumference --      Peak Flow --      Pain Score 05/14/16 0026 4     Pain Loc --      Pain Edu? --      Excl. in GC? --     Constitutional: Alert and oriented. Well appearing and in no acute distress. Eyes: Conjunctivae are normal. PERRL. EOMI. Head: Atraumatic. Mouth/Throat: Mucous membranes are moist.  Oropharynx non-erythematous. Neck: No stridor.  No meningeal signs.  No cervical spine tenderness to palpation. Cardiovascular: Normal rate, regular rhythm. Good peripheral circulation. Grossly normal heart sounds. Respiratory: Normal respiratory effort.  No retractions. Lungs CTAB. Gastrointestinal: Soft and nontender. No distention.  Musculoskeletal: No lower extremity tenderness nor edema. No gross deformities of extremities. Neurologic:  Normal speech and language. No gross focal neurologic deficits are appreciated.  Skin:  Skin is warm, dry and intact. No rash noted. Psychiatric: Mood and affect are normal. Speech and behavior are normal.  ____________________________________________   LABS (all labs ordered are listed, but only abnormal results are  displayed)  Labs Reviewed  BASIC METABOLIC PANEL - Abnormal; Notable for the following:       Result Value   Glucose, Bld 314 (*)    Calcium 8.4 (*)    All other components within normal limits  CBC - Abnormal; Notable for the following:    Platelets 140 (*)    All other components within normal limits  URINALYSIS COMPLETEWITH MICROSCOPIC ([facility] ONLY) - Abnormal; Notable for the following:    Color, Urine YELLOW (*)    APPearance CLEAR (*)    Glucose, UA >500 (*)    Specific Gravity, Urine 1.031 (*)    All other components within normal limits  GLUCOSE, CAPILLARY - Abnormal; Notable for the following:    Glucose-Capillary 298 (*)    All other components within normal limits  GLUCOSE,  CAPILLARY - Abnormal; Notable for the following:    Glucose-Capillary 350 (*)    All other components within normal limits  GLUCOSE, CAPILLARY - Abnormal; Notable for the following:    Glucose-Capillary 314 (*)    All other components within normal limits  GLUCOSE, CAPILLARY - Abnormal; Notable for the following:    Glucose-Capillary 256 (*)    All other components within normal limits  CBG MONITORING, ED       Procedures    INITIAL IMPRESSION / ASSESSMENT AND PLAN / ED COURSE  Pertinent labs & imaging results that were available during my care of the patient were reviewed by me and considered in my medical decision making (see chart for details).  Patient given IV saline 1 L as well as 6 units of subcutaneous insulin with repeat glucose 256 patient requesting discharge at this time. Patient advised to follow-up with primary care provider for further diabetic management.   Clinical Course    ____________________________________________  FINAL CLINICAL IMPRESSION(S) / ED DIAGNOSES  Final diagnoses:  Hyperglycemia     MEDICATIONS GIVEN DURING THIS VISIT:  Medications  insulin aspart (novoLOG) injection 6 Units (6 Units Subcutaneous Given 05/14/16 0425)     NEW OUTPATIENT  MEDICATIONS STARTED DURING THIS VISIT:  New Prescriptions   No medications on file    Modified Medications   No medications on file    Discontinued Medications   No medications on file     Note:  This document was prepared using Dragon voice recognition software and may include unintentional dictation errors.    [name], MD 05/14/16 [phone]

## 2016-05-14 NOTE — ED Triage Notes (Signed)
Pt states at 2230 last night she gave herself usual insulin, was preparing to go to work. Pt states after she administrated insulin, she felt "tingly, hot, and flushed." Pt states she felt her heart was racing at that time. Pt has been recently having difficulty managing her blood sugar. EMS reports a CBG > 400 upon their testing. PIV established per EMS. Pt received 400 ml NS en route to hospital per EMS. Pt states her heart is no longer racing. Pt c/o cough, recently.

## 2016-06-09 ENCOUNTER — Encounter: Payer: Self-pay | Admitting: Emergency Medicine

## 2016-06-09 ENCOUNTER — Emergency Department
Admission: EM | Admit: 2016-06-09 | Discharge: 2016-06-09 | Disposition: A | Discharge status: Home / Self Care | Payer: Self-pay | Attending: Emergency Medicine | Admitting: Emergency Medicine

## 2016-06-09 DIAGNOSIS — I10 Essential (primary) hypertension: Secondary | ICD-10-CM | POA: Insufficient documentation

## 2016-06-09 DIAGNOSIS — E119 Type 2 diabetes mellitus without complications: Secondary | ICD-10-CM | POA: Insufficient documentation

## 2016-06-09 DIAGNOSIS — H6691 Otitis media, unspecified, right ear: Secondary | ICD-10-CM

## 2016-06-09 DIAGNOSIS — Z794 Long term (current) use of insulin: Secondary | ICD-10-CM | POA: Insufficient documentation

## 2016-06-09 DIAGNOSIS — J069 Acute upper respiratory infection, unspecified: Secondary | ICD-10-CM | POA: Insufficient documentation

## 2016-06-09 DIAGNOSIS — Z79899 Other long term (current) drug therapy: Secondary | ICD-10-CM | POA: Insufficient documentation

## 2016-06-09 DIAGNOSIS — F17210 Nicotine dependence, cigarettes, uncomplicated: Secondary | ICD-10-CM | POA: Insufficient documentation

## 2016-06-09 MED ORDER — AMOXICILLIN 500 MG PO TABS: 500.0000 mg | ORAL_TABLET | Freq: Three times a day (TID) | ORAL | 0 refills | Status: DC

## 2016-06-09 MED ORDER — AMOXICILLIN 500 MG PO CAPS
500.0000 mg | ORAL_CAPSULE | Freq: Once | ORAL | Status: AC
  Administered 2016-06-09: 500 mg via ORAL
  Filled 2016-06-09: qty 1

## 2016-06-09 NOTE — ED Notes (Signed)
Pt reports cold sx for 1 week.   Pt also has right earache x 2 days .    cig smoker.

## 2016-06-09 NOTE — ED Triage Notes (Signed)
Pt presents with generalized body aches for one week. States her friend has same sx.

## 2016-06-09 NOTE — ED Provider Notes (Signed)
[facility] Emergency Department Provider Note   ____________________________________________   First MD Initiated Contact with Patient 06/09/16 1938     (approximate)  I have reviewed the triage vital signs and the nursing notes.   HISTORY  Chief Complaint Generalized Body Aches   HPI Karen Lindsey is a 50 y.o. female who presents to the emergency department for evaluation of generalized body aches, cough, headache, and sinus congestion. Symptoms started out as "allergies" about 2 weeks ago and has progressed since that time. She states that she was in a boarding house and others are ill with similar symptoms. She has been taking over-the-counter cold medications without relief. She states that she has had chills but no confirmed fever. She states that she is concerned due to the fact that she is a diabetic and gets bronchitis easily.   Past Medical History:  Diagnosis Date  . Arthritis    rheumatoid  . Diabetes mellitus without complication (HCC)   . Hypertension   . Neuropathy (HCC)   . RA (rheumatoid arthritis) (HCC)     There are no active problems to display for this patient.   Past Surgical History:  Procedure Laterality Date  . ABDOMINAL HYSTERECTOMY    . CESAREAN SECTION    . CHOLECYSTECTOMY    . KNEE SURGERY    . MANDIBLE FRACTURE SURGERY      Prior to Admission medications   Medication Sig Start Date End Date Taking? Authorizing Provider  amoxicillin (AMOXIL) 500 MG tablet Take 1 tablet (500 mg total) by mouth 3 (three) times daily. 06/09/16   Chinita Pester, FNP  baclofen (LIORESAL) 10 MG tablet Take 1 tablet (10 mg total) by mouth 3 (three) times daily. 10/20/15   Chinita Pester, FNP  benzonatate (TESSALON) 200 MG capsule Take 1 capsule (200 mg total) by mouth 3 (three) times daily as needed for cough. 04/28/15   [name], MD  cyclobenzaprine (FLEXERIL) 10 MG tablet Take 1 tablet (10 mg total) by mouth every 8 (eight)  hours as needed for muscle spasms. 02/09/15   Jene Every, MD  gabapentin (NEURONTIN) 300 MG capsule Take 300 mg by mouth 3 (three) times daily.    Historical Provider, MD  insulin aspart protamine- aspart (NOVOLOG MIX 70/30) (70-30) 100 UNIT/ML injection Inject 20-30 Units into the skin 3 (three) times daily before meals.     Historical Provider, MD  insulin glargine (LANTUS) 100 UNIT/ML injection Inject 40 Units into the skin at bedtime.     Historical Provider, MD  insulin NPH Human (HUMULIN N) 100 UNIT/ML injection Inject 0.4 mLs (40 Units total) into the skin at bedtime. 05/02/16   [name], MD  ketorolac (TORADOL) 10 MG tablet Take 1 tablet (10 mg total) by mouth every 8 (eight) hours as needed. Patient taking differently: Take 10 mg by mouth every 8 (eight) hours as needed for moderate pain.  05/12/15   [name], MD  LORazepam (ATIVAN) 0.5 MG tablet Take 1 tablet (0.5 mg total) by mouth every 8 (eight) hours as needed for anxiety. 03/12/15   Darien Ramus, MD  MAGNESIUM PO Take 1 tablet by mouth daily.    Historical Provider, MD  meloxicam (MOBIC) 15 MG tablet Take 1 tablet (15 mg total) by mouth daily. 10/20/15   Chinita Pester, FNP  metFORMIN (GLUCOPHAGE) 500 MG tablet Take 500 mg by mouth 2 (two) times daily with a meal.    Historical Provider, MD  methadone (DOLOPHINE) 5 MG/5ML solution Take 120 mg by mouth daily.     Historical Provider, MD  Multiple Vitamin (MULTIVITAMIN WITH MINERALS) TABS tablet Take 1 tablet by mouth daily.    Historical Provider, MD  ondansetron (ZOFRAN) 4 MG tablet Take 1 tablet (4 mg total) by mouth every 8 (eight) hours as needed for nausea or vomiting. 10/27/15   Phineas Semen, MD  POTASSIUM PO Take 1 tablet by mouth daily.    Historical Provider, MD  promethazine (PHENERGAN) 12.5 MG tablet Take 1 tablet (12.5 mg total) by mouth every 6 (six) hours as needed for nausea or vomiting. 02/15/16   Willy Eddy, MD    Allergies Ultram Marcia Brash  hcl]  No family history on file.  Social History Social History  Substance Use Topics  . Smoking status: Current Every Day Smoker    Packs/day: 0.50    Types: Cigarettes  . Smokeless tobacco: Never Used  . Alcohol use No    Review of Systems Constitutional: Positive for chills Eyes: No visual changes. ENT: No sore throat. Cardiovascular: Denies chest pain. Respiratory: Occasional shortness of breath. Gastrointestinal: No abdominal pain.  No nausea, no vomiting.  No diarrhea. Musculoskeletal: Negative for back pain. Skin: Negative for rash. Neurological: Positive for headaches, negative for focal weakness or numbness.   ____________________________________________   PHYSICAL EXAM:  VITAL SIGNS: ED Triage Vitals  Enc Vitals Group     BP 06/09/16 1813 (!) 143/79     Pulse Rate 06/09/16 1813 76     Resp 06/09/16 1813 20     Temp 06/09/16 1813 98.4 F (36.9 C)     Temp Source 06/09/16 1813 Oral     SpO2 06/09/16 1813 96 %     Weight 06/09/16 1813 144 lb (65.3 kg)     Height --      Head Circumference --      Peak Flow --      Pain Score 06/09/16 1814 4     Pain Loc --      Pain Edu? --      Excl. in GC? --     Constitutional: Alert and oriented. Well appearing and in no acute distress. Eyes: Conjunctivae are normal. PERRL. EOMI. Head: Atraumatic. Nose: Maxillary sinus congestion present without rhinorrhea. Boggy nasal mucosa  Mouth/Throat: Mucous membranes are moist.  Oropharynx non-erythematous. Neck: No stridor.   Hematological/Lymphatic/Immunilogical: No cervical lymphadenopathy. Cardiovascular: Normal rate, regular rhythm. Grossly normal heart sounds.  Good peripheral circulation. Respiratory: Normal respiratory effort.  No retractions. Lungs CTAB. Musculoskeletal: No lower extremity tenderness nor edema.  No joint effusions. Neurologic:  Normal speech and language. No gross focal neurologic deficits are appreciated. No gait instability. Skin:  Skin is  warm, dry and intact. No rash noted. Psychiatric: Mood and affect are normal. Speech and behavior are normal.  ____________________________________________   LABS (all labs ordered are listed, but only abnormal results are displayed)  Labs Reviewed - No data to display ____________________________________________  EKG   ____________________________________________  RADIOLOGY  Not indicated ____________________________________________   PROCEDURES  Procedure(s) performed: None  Procedures  Critical Care performed: No  ____________________________________________   INITIAL IMPRESSION / ASSESSMENT AND PLAN / ED COURSE  Pertinent labs & imaging results that were available during my care of the patient were reviewed by me and considered in my medical decision making (see chart for details).  50 year old female who presents to the emergency department with a two-week history of sinus congestion that has evolved into a headache,  chills, and generalized body aches. She reports that the cough is dry and nonproductive. Tonight, exam is essentially benign. Breath sounds are clear to auscultation. She will receive a prescription for amoxicillin and advised to follow-up with her primary care provider for any symptoms that are not improving over the week. She was advised to return to the emergency department for symptoms that change or worsen if she is unable schedule an appointment with her primary care provider.  Clinical Course      ____________________________________________   FINAL CLINICAL IMPRESSION(S) / ED DIAGNOSES  Final diagnoses:  Right otitis media, unspecified otitis media type  Viral upper respiratory tract infection      NEW MEDICATIONS STARTED DURING THIS VISIT:  Discharge Medication List as of 06/09/2016  7:47 PM    START taking these medications   Details  amoxicillin (AMOXIL) 500 MG tablet Take 1 tablet (500 mg total) by mouth 3 (three) times  daily., Starting Thu 06/09/2016, Print         Note:  This document was prepared using Dragon voice recognition software and may include unintentional dictation errors.    Chinita Pester, FNP 06/09/16 2138    Nita Sickle, MD 06/12/16 [phone]

## 2016-06-09 NOTE — ED Notes (Signed)
Reviewed d/c instructions, follow-up care, prescription with pt. Pt verbalized understanding 

## 2016-07-21 ENCOUNTER — Encounter: Payer: Self-pay | Admitting: Emergency Medicine

## 2016-07-21 ENCOUNTER — Emergency Department
Admission: EM | Admit: 2016-07-21 | Discharge: 2016-07-21 | Disposition: A | Discharge status: Home / Self Care | Payer: BLUE CROSS/BLUE SHIELD | Attending: Emergency Medicine | Admitting: Emergency Medicine

## 2016-07-21 ENCOUNTER — Emergency Department: Payer: BLUE CROSS/BLUE SHIELD

## 2016-07-21 DIAGNOSIS — Z794 Long term (current) use of insulin: Secondary | ICD-10-CM | POA: Diagnosis not present

## 2016-07-21 DIAGNOSIS — I10 Essential (primary) hypertension: Secondary | ICD-10-CM | POA: Diagnosis not present

## 2016-07-21 DIAGNOSIS — E119 Type 2 diabetes mellitus without complications: Secondary | ICD-10-CM | POA: Diagnosis not present

## 2016-07-21 DIAGNOSIS — Z79899 Other long term (current) drug therapy: Secondary | ICD-10-CM | POA: Diagnosis not present

## 2016-07-21 DIAGNOSIS — F17210 Nicotine dependence, cigarettes, uncomplicated: Secondary | ICD-10-CM | POA: Insufficient documentation

## 2016-07-21 DIAGNOSIS — J111 Influenza due to unidentified influenza virus with other respiratory manifestations: Secondary | ICD-10-CM | POA: Diagnosis not present

## 2016-07-21 DIAGNOSIS — R05 Cough: Secondary | ICD-10-CM | POA: Diagnosis present

## 2016-07-21 LAB — RAPID INFLUENZA A&B ANTIGENS (ARMC ONLY)
INFLUENZA A (ARMC): NEGATIVE
INFLUENZA B (ARMC): NEGATIVE

## 2016-07-21 MED ORDER — BENZONATATE 100 MG PO CAPS
200.0000 mg | ORAL_CAPSULE | Freq: Once | ORAL | Status: AC
  Administered 2016-07-21: 200 mg via ORAL
  Filled 2016-07-21: qty 2

## 2016-07-21 MED ORDER — OSELTAMIVIR PHOSPHATE 75 MG PO CAPS
75.0000 mg | ORAL_CAPSULE | Freq: Once | ORAL | Status: AC
  Administered 2016-07-21: 75 mg via ORAL
  Filled 2016-07-21: qty 1

## 2016-07-21 MED ORDER — IBUPROFEN 800 MG PO TABS
800.0000 mg | ORAL_TABLET | Freq: Once | ORAL | Status: AC
  Administered 2016-07-21: 800 mg via ORAL
  Filled 2016-07-21: qty 1

## 2016-07-21 MED ORDER — OSELTAMIVIR PHOSPHATE 75 MG PO CAPS: 75.0000 mg | ORAL_CAPSULE | Freq: Two times a day (BID) | ORAL | 0 refills | Status: AC

## 2016-07-21 NOTE — ED Notes (Signed)
Dr. Brown at bedside for pt evaluation  

## 2016-07-21 NOTE — ED Triage Notes (Signed)
Patient ambulatory to triage with steady gait, without difficulty or distress noted, mask in place; pt reports body aches & cough x 2 days

## 2016-07-21 NOTE — ED Provider Notes (Signed)
[facility] Emergency Department Provider Note   First MD Initiated Contact with Patient 07/21/16 0543     (approximate)  I have reviewed the triage vital signs and the nursing notes.   HISTORY  Chief Complaint Cough and Generalized Body Aches    HPI Karen Lindsey is a 51 y.o. female presents with nonproductive cough and generalized body aches 2 days. Patient admits to sick contact who has the flu.   Past Medical History:  Diagnosis Date  . Arthritis    rheumatoid  . Diabetes mellitus without complication (HCC)   . Hypertension   . Neuropathy (HCC)   . RA (rheumatoid arthritis) (HCC)     There are no active problems to display for this patient.   Past Surgical History:  Procedure Laterality Date  . ABDOMINAL HYSTERECTOMY    . CESAREAN SECTION    . CHOLECYSTECTOMY    . KNEE SURGERY    . MANDIBLE FRACTURE SURGERY      Prior to Admission medications   Medication Sig Start Date End Date Taking? Authorizing Provider  amoxicillin (AMOXIL) 500 MG tablet Take 1 tablet (500 mg total) by mouth 3 (three) times daily. 06/09/16   Chinita Pester, FNP  baclofen (LIORESAL) 10 MG tablet Take 1 tablet (10 mg total) by mouth 3 (three) times daily. 10/20/15   Chinita Pester, FNP  benzonatate (TESSALON) 200 MG capsule Take 1 capsule (200 mg total) by mouth 3 (three) times daily as needed for cough. 04/28/15   [name], MD  cyclobenzaprine (FLEXERIL) 10 MG tablet Take 1 tablet (10 mg total) by mouth every 8 (eight) hours as needed for muscle spasms. 02/09/15   Jene Every, MD  gabapentin (NEURONTIN) 300 MG capsule Take 300 mg by mouth 3 (three) times daily.    Historical Provider, MD  insulin aspart protamine- aspart (NOVOLOG MIX 70/30) (70-30) 100 UNIT/ML injection Inject 20-30 Units into the skin 3 (three) times daily before meals.     Historical Provider, MD  insulin glargine (LANTUS) 100 UNIT/ML injection Inject 40 Units into the skin at bedtime.      Historical Provider, MD  insulin NPH Human (HUMULIN N) 100 UNIT/ML injection Inject 0.4 mLs (40 Units total) into the skin at bedtime. 05/02/16   [name], MD  ketorolac (TORADOL) 10 MG tablet Take 1 tablet (10 mg total) by mouth every 8 (eight) hours as needed. Patient taking differently: Take 10 mg by mouth every 8 (eight) hours as needed for moderate pain.  05/12/15   [name], MD  LORazepam (ATIVAN) 0.5 MG tablet Take 1 tablet (0.5 mg total) by mouth every 8 (eight) hours as needed for anxiety. 03/12/15   Darien Ramus, MD  MAGNESIUM PO Take 1 tablet by mouth daily.    Historical Provider, MD  meloxicam (MOBIC) 15 MG tablet Take 1 tablet (15 mg total) by mouth daily. 10/20/15   Chinita Pester, FNP  metFORMIN (GLUCOPHAGE) 500 MG tablet Take 500 mg by mouth 2 (two) times daily with a meal.    Historical Provider, MD  methadone (DOLOPHINE) 5 MG/5ML solution Take 120 mg by mouth daily.     Historical Provider, MD  Multiple Vitamin (MULTIVITAMIN WITH MINERALS) TABS tablet Take 1 tablet by mouth daily.    Historical Provider, MD  ondansetron (ZOFRAN) 4 MG tablet Take 1 tablet (4 mg total) by mouth every 8 (eight) hours as needed for nausea or vomiting. 10/27/15   Phineas Semen,  MD  oseltamivir (TAMIFLU) 75 MG capsule Take 1 capsule (75 mg total) by mouth 2 (two) times daily. 07/21/16 07/26/16  [name], MD  POTASSIUM PO Take 1 tablet by mouth daily.    Historical Provider, MD  promethazine (PHENERGAN) 12.5 MG tablet Take 1 tablet (12.5 mg total) by mouth every 6 (six) hours as needed for nausea or vomiting. 02/15/16   Willy Eddy, MD    Allergies Ultram Marcia Brash hcl]  No family history on file.  Social History Social History  Substance Use Topics  . Smoking status: Current Every Day Smoker    Packs/day: 0.50    Types: Cigarettes  . Smokeless tobacco: Never Used  . Alcohol use No    Review of Systems Constitutional: Positive fever/chills Eyes: No visual  changes. ENT: No sore throat. Cardiovascular: Denies chest pain. Respiratory: Denies shortness of breath. Gastrointestinal: No abdominal pain.  No nausea, no vomiting.  No diarrhea.  No constipation. Genitourinary: Negative for dysuria. Musculoskeletal: Negative for back pain. Positive for generalized muscle aches Skin: Negative for rash. Neurological: Negative for headaches, focal weakness or numbness.  10-point ROS otherwise negative.  ____________________________________________   PHYSICAL EXAM:  VITAL SIGNS: ED Triage Vitals [07/21/16 0039]  Enc Vitals Group     BP (!) 144/65     Pulse Rate 62     Resp 18     Temp 98.2 F (36.8 C)     Temp Source Oral     SpO2 99 %     Weight 140 lb (63.5 kg)     Height 5\' 1"  (1.549 m)     Head Circumference      Peak Flow      Pain Score 5     Pain Loc      Pain Edu?      Excl. in GC?     Constitutional: Alert and oriented. Well appearing and in no acute distress. Eyes: Conjunctivae are normal. PERRL. EOMI. Head: Atraumatic. Ears:  Healthy appearing ear canals and TMs bilaterally Nose: No congestion/rhinnorhea. Mouth/Throat: Mucous membranes are moist.  Oropharynx non-erythematous. Neck: No stridor.  No meningeal signs.  Cardiovascular: Normal rate, regular rhythm. Good peripheral circulation. Grossly normal heart sounds. Respiratory: Normal respiratory effort.  No retractions. Lungs CTAB. Gastrointestinal: Soft and nontender. No distention.  Musculoskeletal: No lower extremity tenderness nor edema. No gross deformities of extremities. Neurologic:  Normal speech and language. No gross focal neurologic deficits are appreciated.  Skin:  Skin is warm, dry and intact. No rash noted.   ____________________________________________   LABS (all labs ordered are listed, but only abnormal results are displayed)  Labs Reviewed  RAPID INFLUENZA A&B ANTIGENS ([facility] ONLY)     RADIOLOGY I, [place] Calvin Chura, personally viewed and  evaluated these images (plain radiographs) as part of my medical decision making, as well as reviewing the written report by the radiologist.  Dg Chest 2 View  Result Date: 07/21/2016 CLINICAL DATA:  Cough and fever. Body aches, stuffy nose, and chest congestion. Symptoms for 2 days. History of diabetes and hypertension. Smoker. EXAM: CHEST  2 VIEW COMPARISON:  09/01/2015 FINDINGS: The heart size and mediastinal contours are within normal limits. Both lungs are clear. The visualized skeletal structures are unremarkable. IMPRESSION: No active cardiopulmonary disease. Electronically Signed   By: 09/03/2015 M.D.   On: 07/21/2016 02:52     Procedures     INITIAL IMPRESSION / ASSESSMENT AND PLAN / ED COURSE  Pertinent labs & imaging results that were  available during my care of the patient were reviewed by me and considered in my medical decision making (see chart for details).  History physical exam consistent with influenza as such patient given Tamiflu Tessalon Perles in the emergency department.   Clinical Course     ____________________________________________  FINAL CLINICAL IMPRESSION(S) / ED DIAGNOSES  Final diagnoses:  Influenza     MEDICATIONS GIVEN DURING THIS VISIT:  Medications  oseltamivir (TAMIFLU) capsule 75 mg (not administered)  benzonatate (TESSALON) capsule 200 mg (not administered)     NEW OUTPATIENT MEDICATIONS STARTED DURING THIS VISIT:  New Prescriptions   OSELTAMIVIR (TAMIFLU) 75 MG CAPSULE    Take 1 capsule (75 mg total) by mouth 2 (two) times daily.    Modified Medications   No medications on file    Discontinued Medications   No medications on file     Note:  This document was prepared using Dragon voice recognition software and may include unintentional dictation errors.    [name], MD 07/21/16 [phone]

## 2016-09-12 ENCOUNTER — Encounter: Payer: Self-pay | Admitting: Emergency Medicine

## 2016-09-12 ENCOUNTER — Emergency Department
Admission: EM | Admit: 2016-09-12 | Discharge: 2016-09-13 | Disposition: A | Discharge status: Home / Self Care | Payer: BLUE CROSS/BLUE SHIELD | Attending: Emergency Medicine | Admitting: Emergency Medicine

## 2016-09-12 DIAGNOSIS — R519 Headache, unspecified: Secondary | ICD-10-CM

## 2016-09-12 DIAGNOSIS — Z760 Encounter for issue of repeat prescription: Secondary | ICD-10-CM

## 2016-09-12 DIAGNOSIS — F17210 Nicotine dependence, cigarettes, uncomplicated: Secondary | ICD-10-CM | POA: Insufficient documentation

## 2016-09-12 DIAGNOSIS — R51 Headache: Secondary | ICD-10-CM | POA: Insufficient documentation

## 2016-09-12 DIAGNOSIS — E119 Type 2 diabetes mellitus without complications: Secondary | ICD-10-CM | POA: Insufficient documentation

## 2016-09-12 DIAGNOSIS — Z794 Long term (current) use of insulin: Secondary | ICD-10-CM | POA: Insufficient documentation

## 2016-09-12 DIAGNOSIS — I10 Essential (primary) hypertension: Secondary | ICD-10-CM | POA: Insufficient documentation

## 2016-09-12 LAB — BASIC METABOLIC PANEL
Anion gap: 6 (ref 5–15)
BUN: 22 mg/dL — AB (ref 6–20)
CALCIUM: 9.6 mg/dL (ref 8.9–10.3)
CO2: 33 mmol/L — ABNORMAL HIGH (ref 22–32)
CREATININE: 0.68 mg/dL (ref 0.44–1.00)
Chloride: 97 mmol/L — ABNORMAL LOW (ref 101–111)
GFR calc non Af Amer: >60 mL/min (ref >60)
Glucose, Bld: 174 mg/dL — ABNORMAL HIGH (ref 65–99)
Potassium: 3.2 mmol/L — ABNORMAL LOW (ref 3.5–5.1)
SODIUM: 136 mmol/L (ref 135–145)

## 2016-09-12 LAB — URINALYSIS, COMPLETE (UACMP) WITH MICROSCOPIC
Bacteria, UA: NONE SEEN
Bilirubin Urine: NEGATIVE
HGB URINE DIPSTICK: NEGATIVE
KETONES UR: NEGATIVE mg/dL
LEUKOCYTES UA: NEGATIVE
Nitrite: NEGATIVE
PH: 5 (ref 5.0–8.0)
PROTEIN: NEGATIVE mg/dL
RBC / HPF: NONE SEEN RBC/hpf (ref 0–5)
SQUAMOUS EPITHELIAL / LPF: NONE SEEN
Specific Gravity, Urine: 1.021 (ref 1.005–1.030)

## 2016-09-12 LAB — CBC
HCT: 42.3 % (ref 35.0–47.0)
Hemoglobin: 14.4 g/dL (ref 12.0–16.0)
MCH: 29.1 pg (ref 26.0–34.0)
MCHC: 33.9 g/dL (ref 32.0–36.0)
MCV: 85.7 fL (ref 80.0–100.0)
PLATELETS: 181 10*3/uL (ref 150–440)
RBC: 4.94 MIL/uL (ref 3.80–5.20)
RDW: 13 % (ref 11.5–14.5)
WBC: 9.8 10*3/uL (ref 3.6–11.0)

## 2016-09-12 LAB — GLUCOSE, CAPILLARY: Glucose-Capillary: 159 mg/dL — ABNORMAL HIGH (ref 65–99)

## 2016-09-12 NOTE — ED Triage Notes (Addendum)
Pt presents to ED with headache tonight around 1900. Pt states she felt like she was getting sick but thought it was because she hadn't been taking her insulin for the past couple of days. Blood sugar within normal range per her MD parameters. Has not been checking her blood sugar lately because she doesn't have very many lancets. Pt states she has recently stopped drinking coke and thinks that may be causing her headache.

## 2016-09-13 MED ORDER — INSULIN NPH ISOPHANE & REGULAR (70-30) 100 UNIT/ML ~~LOC~~ SUSP: SUBCUTANEOUS | 0 refills | Status: DC

## 2016-09-13 MED ORDER — IBUPROFEN 600 MG PO TABS
600.0000 mg | ORAL_TABLET | Freq: Once | ORAL | Status: AC
  Administered 2016-09-13: 600 mg via ORAL
  Filled 2016-09-13: qty 1

## 2016-09-13 NOTE — ED Provider Notes (Addendum)
[facility] Emergency Department Provider Note  ____________________________________________   First MD Initiated Contact with Patient 09/12/16 2302     (approximate)  I have reviewed the triage vital signs and the nursing notes.   HISTORY  Chief Complaint Headache   HPI Karen Lindsey is a 51 y.o. female with a history of hypertension, diabetes and neuropathy was presented to the emergency department today with a headache. She says the headache started between 8 and 9 tonight it was gradual in onset. She describes as a 4 out of 5 right now on his frontal as well as posterior. She says it is aching. It is not associated with photophobia, nausea or vomiting or any vision changes. She says that she has had headaches like this over the past week ever since cutting down on her caffeine intake. She said that she was drinking up to 2 L of Coca-Cola per day and is now drinking 3x12 ounce portions per day. She says that she is also changed her diet and is also quitting smoking at this time as well.  Patient says that she has had headaches in the past when her sugar has been up. Although her sugar today is in the 100s.   Past Medical History:  Diagnosis Date  . Arthritis    rheumatoid  . Diabetes mellitus without complication (HCC)   . Hypertension   . Neuropathy (HCC)   . RA (rheumatoid arthritis) (HCC)     There are no active problems to display for this patient.   Past Surgical History:  Procedure Laterality Date  . ABDOMINAL HYSTERECTOMY    . CESAREAN SECTION    . CHOLECYSTECTOMY    . KNEE SURGERY    . MANDIBLE FRACTURE SURGERY      Prior to Admission medications   Medication Sig Start Date End Date Taking? Authorizing Provider  amoxicillin (AMOXIL) 500 MG tablet Take 1 tablet (500 mg total) by mouth 3 (three) times daily. 06/09/16   Chinita Pester, FNP  baclofen (LIORESAL) 10 MG tablet Take 1 tablet (10 mg total) by mouth 3 (three) times daily.  10/20/15   Chinita Pester, FNP  benzonatate (TESSALON) 200 MG capsule Take 1 capsule (200 mg total) by mouth 3 (three) times daily as needed for cough. 04/28/15   [name], MD  cyclobenzaprine (FLEXERIL) 10 MG tablet Take 1 tablet (10 mg total) by mouth every 8 (eight) hours as needed for muscle spasms. 02/09/15   Jene Every, MD  gabapentin (NEURONTIN) 300 MG capsule Take 300 mg by mouth 3 (three) times daily.    Historical Provider, MD  insulin aspart protamine- aspart (NOVOLOG MIX 70/30) (70-30) 100 UNIT/ML injection Inject 20-30 Units into the skin 3 (three) times daily before meals.     Historical Provider, MD  insulin glargine (LANTUS) 100 UNIT/ML injection Inject 40 Units into the skin at bedtime.     Historical Provider, MD  insulin NPH Human (HUMULIN N) 100 UNIT/ML injection Inject 0.4 mLs (40 Units total) into the skin at bedtime. 05/02/16   [name], MD  ketorolac (TORADOL) 10 MG tablet Take 1 tablet (10 mg total) by mouth every 8 (eight) hours as needed. Patient taking differently: Take 10 mg by mouth every 8 (eight) hours as needed for moderate pain.  05/12/15   [name], MD  LORazepam (ATIVAN) 0.5 MG tablet Take 1 tablet (0.5 mg total) by mouth every 8 (eight) hours as needed for anxiety. 03/12/15  Darien Ramus, MD  MAGNESIUM PO Take 1 tablet by mouth daily.    Historical Provider, MD  meloxicam (MOBIC) 15 MG tablet Take 1 tablet (15 mg total) by mouth daily. 10/20/15   Chinita Pester, FNP  metFORMIN (GLUCOPHAGE) 500 MG tablet Take 500 mg by mouth 2 (two) times daily with a meal.    Historical Provider, MD  methadone (DOLOPHINE) 5 MG/5ML solution Take 120 mg by mouth daily.     Historical Provider, MD  Multiple Vitamin (MULTIVITAMIN WITH MINERALS) TABS tablet Take 1 tablet by mouth daily.    Historical Provider, MD  ondansetron (ZOFRAN) 4 MG tablet Take 1 tablet (4 mg total) by mouth every 8 (eight) hours as needed for nausea or vomiting. 10/27/15   Phineas Semen, MD  POTASSIUM PO Take 1 tablet by mouth daily.    Historical Provider, MD  promethazine (PHENERGAN) 12.5 MG tablet Take 1 tablet (12.5 mg total) by mouth every 6 (six) hours as needed for nausea or vomiting. 02/15/16   Willy Eddy, MD    Allergies Ultram Marcia Brash hcl]  No family history on file.  Social History Social History  Substance Use Topics  . Smoking status: Current Every Day Smoker    Packs/day: 0.50    Types: Cigarettes  . Smokeless tobacco: Never Used  . Alcohol use No    Review of Systems Constitutional: No fever/chills Eyes: No visual changes. ENT: No sore throat. Cardiovascular: Denies chest pain. Respiratory: Denies shortness of breath. Gastrointestinal: No abdominal pain.  No nausea, no vomiting.  No diarrhea.  No constipation. Genitourinary: Negative for dysuria. Musculoskeletal: Negative for back pain. Skin: Negative for rash. Neurological: Negative for focal weakness or numbness.  10-point ROS otherwise negative.  ____________________________________________   PHYSICAL EXAM:  VITAL SIGNS: ED Triage Vitals [09/12/16 2204]  Enc Vitals Group     BP 133/71     Pulse Rate 76     Resp 18     Temp 97.9 F (36.6 C)     Temp Source Oral     SpO2 96 %     Weight 140 lb (63.5 kg)     Height 5\' 1"  (1.549 m)     Head Circumference      Peak Flow      Pain Score 4     Pain Loc      Pain Edu?      Excl. in GC?     Constitutional: Alert and oriented. Well appearing and in no acute distress. Eyes: Conjunctivae are normal. PERRL. EOMI. Head: Atraumatic. Nose: No congestion/rhinnorhea. Mouth/Throat: Mucous membranes are moist.   Neck: No stridor.   Cardiovascular: Normal rate, regular rhythm. Grossly normal heart sounds.   Respiratory: Normal respiratory effort.  No retractions. Lungs CTAB. Gastrointestinal: Soft and nontender. No distention.  Musculoskeletal: No lower extremity tenderness nor edema.  No joint effusions. Neurologic:   Normal speech and language. No gross focal neurologic deficits are appreciated.  Skin:  Skin is warm, dry and intact. No rash noted. Psychiatric: Mood and affect are normal. Speech and behavior are normal.  ____________________________________________   LABS (all labs ordered are listed, but only abnormal results are displayed)  Labs Reviewed  BASIC METABOLIC PANEL - Abnormal; Notable for the following:       Result Value   Potassium 3.2 (*)    Chloride 97 (*)    CO2 33 (*)    Glucose, Bld 174 (*)    BUN 22 (*)  All other components within normal limits  URINALYSIS, COMPLETE (UACMP) WITH MICROSCOPIC - Abnormal; Notable for the following:    Color, Urine YELLOW (*)    APPearance CLEAR (*)    Glucose, UA >=500 (*)    All other components within normal limits  GLUCOSE, CAPILLARY - Abnormal; Notable for the following:    Glucose-Capillary 159 (*)    All other components within normal limits  CBC  CBG MONITORING, ED   ____________________________________________  EKG   ____________________________________________  RADIOLOGY   ____________________________________________   PROCEDURES  Procedure(s) performed:   Procedures  Critical Care performed:   ____________________________________________   INITIAL IMPRESSION / ASSESSMENT AND PLAN / ED COURSE  Pertinent labs & imaging results that were available during my care of the patient were reviewed by me and considered in my medical decision making (see chart for details).  ----------------------------------------- 12:45 AM on 09/13/2016 -----------------------------------------  Patient says that Advil usually works for headaches. We'll give ibuprofen here in the emergency department. Will also renew her prescription for her 70/30 insulin which she takes at home. 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 Multiple factors contributing to likely headache which she has had in the past. Headache  was of gradual onset. No neurologic deficits. No migrainous features. Patient says that she would like to go home at this time. We discussed CAT scan if persistent headaches and that this can be done through her primary care doctor. She is understanding of this plan and willing to comply. No red flag signs for secondary cephalgia such as subarachnoid hemorrhage or intracranial hemorrhage. No fever.      ____________________________________________   FINAL CLINICAL IMPRESSION(S) / ED DIAGNOSES  Headache. Medication refill.    NEW MEDICATIONS STARTED DURING THIS VISIT:  New Prescriptions   No medications on file     Note:  This document was prepared using Dragon voice recognition software and may include unintentional dictation errors.    Myrna Blazer, MD 09/13/16 [phone]  A she is reporting that she takes Humulin 70/30 20-30 units twice a day. I do not see this listed on her medication list. I checked with the pharmacist, Matt, to ensure that this was a reasonable dose for this patient. She'll be discharged with a prescription for this medication willing to follow with her primary care doctor for further refills.    Myrna Blazer, MD 09/13/16 [phone]

## 2016-10-03 ENCOUNTER — Encounter: Payer: Self-pay | Admitting: Emergency Medicine

## 2016-10-03 ENCOUNTER — Emergency Department
Admission: EM | Admit: 2016-10-03 | Discharge: 2016-10-03 | Disposition: A | Discharge status: Home / Self Care | Payer: BLUE CROSS/BLUE SHIELD | Attending: Emergency Medicine | Admitting: Emergency Medicine

## 2016-10-03 DIAGNOSIS — R739 Hyperglycemia, unspecified: Secondary | ICD-10-CM

## 2016-10-03 DIAGNOSIS — E1165 Type 2 diabetes mellitus with hyperglycemia: Secondary | ICD-10-CM | POA: Insufficient documentation

## 2016-10-03 DIAGNOSIS — I10 Essential (primary) hypertension: Secondary | ICD-10-CM | POA: Insufficient documentation

## 2016-10-03 DIAGNOSIS — Z79899 Other long term (current) drug therapy: Secondary | ICD-10-CM | POA: Insufficient documentation

## 2016-10-03 DIAGNOSIS — B349 Viral infection, unspecified: Secondary | ICD-10-CM | POA: Diagnosis not present

## 2016-10-03 DIAGNOSIS — Z794 Long term (current) use of insulin: Secondary | ICD-10-CM | POA: Diagnosis not present

## 2016-10-03 DIAGNOSIS — J029 Acute pharyngitis, unspecified: Secondary | ICD-10-CM | POA: Diagnosis present

## 2016-10-03 DIAGNOSIS — F17210 Nicotine dependence, cigarettes, uncomplicated: Secondary | ICD-10-CM | POA: Diagnosis not present

## 2016-10-03 DIAGNOSIS — G6289 Other specified polyneuropathies: Secondary | ICD-10-CM | POA: Diagnosis not present

## 2016-10-03 LAB — COMPREHENSIVE METABOLIC PANEL
ALBUMIN: 3.9 g/dL (ref 3.5–5.0)
ALT: 26 U/L (ref 14–54)
AST: 25 U/L (ref 15–41)
Alkaline Phosphatase: 63 U/L (ref 38–126)
Anion gap: 5 (ref 5–15)
BUN: 20 mg/dL (ref 6–20)
CHLORIDE: 97 mmol/L — AB (ref 101–111)
CO2: 32 mmol/L (ref 22–32)
CREATININE: 0.71 mg/dL (ref 0.44–1.00)
Calcium: 9.2 mg/dL (ref 8.9–10.3)
GFR calc Af Amer: >60 mL/min (ref >60)
GLUCOSE: 448 mg/dL — AB (ref 65–99)
POTASSIUM: 4.3 mmol/L (ref 3.5–5.1)
Sodium: 134 mmol/L — ABNORMAL LOW (ref 135–145)
Total Bilirubin: 0.6 mg/dL (ref 0.3–1.2)
Total Protein: 7.2 g/dL (ref 6.5–8.1)

## 2016-10-03 LAB — CBC
HEMATOCRIT: 42.7 % (ref 35.0–47.0)
HEMOGLOBIN: 14.1 g/dL (ref 12.0–16.0)
MCH: 28.6 pg (ref 26.0–34.0)
MCHC: 33.1 g/dL (ref 32.0–36.0)
MCV: 86.4 fL (ref 80.0–100.0)
Platelets: 188 10*3/uL (ref 150–440)
RBC: 4.94 MIL/uL (ref 3.80–5.20)
RDW: 13.5 % (ref 11.5–14.5)
WBC: 10.4 10*3/uL (ref 3.6–11.0)

## 2016-10-03 LAB — URINALYSIS, COMPLETE (UACMP) WITH MICROSCOPIC
BACTERIA UA: NONE SEEN
BILIRUBIN URINE: NEGATIVE
Glucose, UA: >=500 mg/dL — AB
HGB URINE DIPSTICK: NEGATIVE
Ketones, ur: NEGATIVE mg/dL
Leukocytes, UA: NEGATIVE
NITRITE: NEGATIVE
PROTEIN: NEGATIVE mg/dL
RBC / HPF: NONE SEEN RBC/hpf (ref 0–5)
SPECIFIC GRAVITY, URINE: 1.032 — AB (ref 1.005–1.030)
Squamous Epithelial / LPF: NONE SEEN
pH: 6 (ref 5.0–8.0)

## 2016-10-03 LAB — GLUCOSE, CAPILLARY
Glucose-Capillary: 413 mg/dL — ABNORMAL HIGH (ref 65–99)
Glucose-Capillary: 171 mg/dL — ABNORMAL HIGH (ref 65–99)

## 2016-10-03 LAB — LIPASE, BLOOD: LIPASE: 12 U/L (ref 11–51)

## 2016-10-03 MED ORDER — SODIUM CHLORIDE 0.9 % IV BOLUS (SEPSIS)
1000.0000 mL | Freq: Once | INTRAVENOUS | Status: AC
  Administered 2016-10-03: 1000 mL via INTRAVENOUS

## 2016-10-03 NOTE — ED Triage Notes (Signed)
Pt reports cold symptoms and sore throat for three days. Pt reports RLQ abdominal pain and has vomited a couple of times the past three days. Pt reports is a diabetic and her sugar has been reading high. Pt reports right great toe pain.

## 2016-10-03 NOTE — ED Notes (Signed)
CBG monitoring done 

## 2016-10-03 NOTE — ED Provider Notes (Signed)
[facility] Emergency Department Provider Note  ____________________________________________   First MD Initiated Contact with Patient 10/03/16 2111     (approximate)  I have reviewed the triage vital signs and the nursing notes.   HISTORY  Chief Complaint Sore Throat; Multiple complaints; and Abdominal Pain   HPI Karen Lindsey is a 51 y.o. female with a history of diabetes was presented to the emergency department today with cough, nausea vomiting as well as right toe and abdominal pain over the past 3 days. She says that she was having nausea and vomiting up until today but now is able to tolerate by mouth fluids. She is also complaining of rhinorrhea. Does not report fever. Says that the abdominal pain is also now resolved. Also reporting body aches similar to when she had flu in the past. Says "I know this is a virus."   Past Medical History:  Diagnosis Date  . Arthritis    rheumatoid  . Diabetes mellitus without complication (HCC)   . Hypertension   . Neuropathy (HCC)   . RA (rheumatoid arthritis) (HCC)     There are no active problems to display for this patient.   Past Surgical History:  Procedure Laterality Date  . ABDOMINAL HYSTERECTOMY    . CESAREAN SECTION    . CHOLECYSTECTOMY    . KNEE SURGERY    . MANDIBLE FRACTURE SURGERY      Prior to Admission medications   Medication Sig Start Date End Date Taking? Authorizing Provider  amoxicillin (AMOXIL) 500 MG tablet Take 1 tablet (500 mg total) by mouth 3 (three) times daily. 06/09/16   Chinita Pester, FNP  baclofen (LIORESAL) 10 MG tablet Take 1 tablet (10 mg total) by mouth 3 (three) times daily. 10/20/15   Chinita Pester, FNP  benzonatate (TESSALON) 200 MG capsule Take 1 capsule (200 mg total) by mouth 3 (three) times daily as needed for cough. 04/28/15   [name], MD  cyclobenzaprine (FLEXERIL) 10 MG tablet Take 1 tablet (10 mg total) by mouth every 8 (eight) hours as needed  for muscle spasms. 02/09/15   Jene Every, MD  gabapentin (NEURONTIN) 300 MG capsule Take 300 mg by mouth 3 (three) times daily.    Historical Provider, MD  insulin aspart protamine- aspart (NOVOLOG MIX 70/30) (70-30) 100 UNIT/ML injection Inject 20-30 Units into the skin 3 (three) times daily before meals.     Historical Provider, MD  insulin glargine (LANTUS) 100 UNIT/ML injection Inject 40 Units into the skin at bedtime.     Historical Provider, MD  insulin NPH Human (HUMULIN N) 100 UNIT/ML injection Inject 0.4 mLs (40 Units total) into the skin at bedtime. 05/02/16   [name], MD  insulin NPH-regular Human (NOVOLIN 70/30) (70-30) 100 UNIT/ML injection 20-30 units [place] before breakfast and dinner  Check glucose prior to usage 09/13/16   Myrna Blazer, MD  ketorolac (TORADOL) 10 MG tablet Take 1 tablet (10 mg total) by mouth every 8 (eight) hours as needed. Patient taking differently: Take 10 mg by mouth every 8 (eight) hours as needed for moderate pain.  05/12/15   [name], MD  LORazepam (ATIVAN) 0.5 MG tablet Take 1 tablet (0.5 mg total) by mouth every 8 (eight) hours as needed for anxiety. 03/12/15   Darien Ramus, MD  MAGNESIUM PO Take 1 tablet by mouth daily.    Historical Provider, MD  meloxicam (MOBIC) 15 MG tablet Take 1 tablet (15 mg  total) by mouth daily. 10/20/15   Chinita Pester, FNP  metFORMIN (GLUCOPHAGE) 500 MG tablet Take 500 mg by mouth 2 (two) times daily with a meal.    Historical Provider, MD  methadone (DOLOPHINE) 5 MG/5ML solution Take 120 mg by mouth daily.     Historical Provider, MD  Multiple Vitamin (MULTIVITAMIN WITH MINERALS) TABS tablet Take 1 tablet by mouth daily.    Historical Provider, MD  ondansetron (ZOFRAN) 4 MG tablet Take 1 tablet (4 mg total) by mouth every 8 (eight) hours as needed for nausea or vomiting. 10/27/15   Phineas Semen, MD  POTASSIUM PO Take 1 tablet by mouth daily.    Historical Provider, MD  promethazine (PHENERGAN) 12.5  MG tablet Take 1 tablet (12.5 mg total) by mouth every 6 (six) hours as needed for nausea or vomiting. 02/15/16   Willy Eddy, MD    Allergies Ultram Marcia Brash hcl]  No family history on file.  Social History Social History  Substance Use Topics  . Smoking status: Current Every Day Smoker    Packs/day: 0.50    Types: Cigarettes  . Smokeless tobacco: Never Used  . Alcohol use No    Review of Systems Constitutional: No fever/chills Eyes: No visual changes. ENT: as abve Cardiovascular: Denies chest pain. Respiratory: Denies shortness of breath. Gastrointestinal:  No diarrhea.  No constipation. Genitourinary: Negative for dysuria. Musculoskeletal: Negative for back pain. Skin: Negative for rash. Neurological: Negative for headaches, focal weakness or numbness.  10-point ROS otherwise negative.  ____________________________________________   PHYSICAL EXAM:  VITAL SIGNS: ED Triage Vitals  Enc Vitals Group     BP 10/03/16 1800 132/66     Pulse Rate 10/03/16 1800 64     Resp 10/03/16 1800 18     Temp 10/03/16 1800 97.8 F (36.6 C)     Temp Source 10/03/16 1800 Oral     SpO2 10/03/16 1800 98 %     Weight 10/03/16 1801 140 lb (63.5 kg)     Height 10/03/16 1801 5\' 1"  (1.549 m)     Head Circumference --      Peak Flow --      Pain Score 10/03/16 1800 5     Pain Loc --      Pain Edu? --      Excl. in GC? --     Constitutional: Alert and oriented. Well appearing and in no acute distress. Eyes: Conjunctivae are normal. PERRL. EOMI. Head: Atraumatic. Serous fluid behind the right TM. No bulging or erythema of the TM. Normal left TM. Nose: Clear rhinorrhea to the bilateral nares. Mouth/Throat: Mucous membranes are moist.  Oropharynx non-erythematous. Neck: No stridor.   Cardiovascular: Normal rate, regular rhythm. Grossly normal heart sounds.  Good peripheral circulation with equal and bilateral dorsalis pedis pulses. Respiratory: Normal respiratory effort.  No  retractions. Lungs CTAB. Gastrointestinal: Soft and nontender. No distention.  No CVA tenderness. Musculoskeletal: No lower extremity edema.  No joint effusions. No erythema or induration to the bilateral feet. However, the patient reports pain on light palpation to the bilateral dorsums of the feet which is consistent with her neuropathy. Neurologic:  Normal speech and language. No gross focal neurologic deficits are appreciated. No gait instability. Skin:  Skin is warm, dry and intact. No rash noted. Psychiatric: Mood and affect are normal. Speech and behavior are normal.  ____________________________________________   LABS (all labs ordered are listed, but only abnormal results are displayed)  Labs Reviewed  COMPREHENSIVE METABOLIC PANEL - Abnormal;  Notable for the following:       Result Value   Sodium 134 (*)    Chloride 97 (*)    Glucose, Bld 448 (*)    All other components within normal limits  URINALYSIS, COMPLETE (UACMP) WITH MICROSCOPIC - Abnormal; Notable for the following:    Color, Urine YELLOW (*)    APPearance HAZY (*)    Specific Gravity, Urine 1.032 (*)    Glucose, UA >=500 (*)    All other components within normal limits  GLUCOSE, CAPILLARY - Abnormal; Notable for the following:    Glucose-Capillary 413 (*)    All other components within normal limits  GLUCOSE, CAPILLARY - Abnormal; Notable for the following:    Glucose-Capillary 171 (*)    All other components within normal limits  LIPASE, BLOOD  CBC  CBG MONITORING, ED   ____________________________________________  EKG   ____________________________________________  RADIOLOGY   ____________________________________________   PROCEDURES  Procedure(s) performed:   Procedures  Critical Care performed:   ____________________________________________   INITIAL IMPRESSION / ASSESSMENT AND PLAN / ED COURSE  Pertinent labs & imaging results that were available during my care of the patient were  reviewed by me and considered in my medical decision making (see chart for details).  ----------------------------------------- 9:58 PM on 10/03/2016 -----------------------------------------  Glucose now 171 after fluids. Patient with likely viral syndrome. Out of the window for Tamiflu at this time. She is also requesting a refill of her Humulin 70/30 and I gave her a paper prescription because this medication does not appear to be available as an option in the Epic discharge instructions. We discussed keeping tighter control of her glucose and she'll be following up with Phineas Real for this as well as recommendations for podiatry follow-up. The patient is understanding of this plan and willing to comply. The patient's abdominal exam was completely benign and there is a normal white count. Unlikely to be appendicitis especially within the context of the constellation of symptoms.      ____________________________________________   FINAL CLINICAL IMPRESSION(S) / ED DIAGNOSES  Viral syndrome. Hyperglycemia.    NEW MEDICATIONS STARTED DURING THIS VISIT:  New Prescriptions   No medications on file     Note:  This document was prepared using Dragon voice recognition software and may include unintentional dictation errors.    Myrna Blazer, MD 10/03/16 2159

## 2016-11-02 ENCOUNTER — Emergency Department
Admission: EM | Admit: 2016-11-02 | Discharge: 2016-11-02 | Disposition: A | Discharge status: Home / Self Care | Payer: BLUE CROSS/BLUE SHIELD | Attending: Emergency Medicine | Admitting: Emergency Medicine

## 2016-11-02 ENCOUNTER — Encounter: Payer: Self-pay | Admitting: Emergency Medicine

## 2016-11-02 ENCOUNTER — Emergency Department: Payer: BLUE CROSS/BLUE SHIELD

## 2016-11-02 DIAGNOSIS — W010XXA Fall on same level from slipping, tripping and stumbling without subsequent striking against object, initial encounter: Secondary | ICD-10-CM | POA: Insufficient documentation

## 2016-11-02 DIAGNOSIS — Z79899 Other long term (current) drug therapy: Secondary | ICD-10-CM | POA: Insufficient documentation

## 2016-11-02 DIAGNOSIS — E119 Type 2 diabetes mellitus without complications: Secondary | ICD-10-CM | POA: Insufficient documentation

## 2016-11-02 DIAGNOSIS — Z794 Long term (current) use of insulin: Secondary | ICD-10-CM | POA: Insufficient documentation

## 2016-11-02 DIAGNOSIS — M79661 Pain in right lower leg: Secondary | ICD-10-CM

## 2016-11-02 DIAGNOSIS — I10 Essential (primary) hypertension: Secondary | ICD-10-CM | POA: Insufficient documentation

## 2016-11-02 DIAGNOSIS — Y990 Civilian activity done for income or pay: Secondary | ICD-10-CM | POA: Insufficient documentation

## 2016-11-02 DIAGNOSIS — Y9389 Activity, other specified: Secondary | ICD-10-CM | POA: Insufficient documentation

## 2016-11-02 DIAGNOSIS — S8391XA Sprain of unspecified site of right knee, initial encounter: Secondary | ICD-10-CM | POA: Insufficient documentation

## 2016-11-02 DIAGNOSIS — Y929 Unspecified place or not applicable: Secondary | ICD-10-CM | POA: Insufficient documentation

## 2016-11-02 DIAGNOSIS — F17210 Nicotine dependence, cigarettes, uncomplicated: Secondary | ICD-10-CM | POA: Insufficient documentation

## 2016-11-02 MED ORDER — HYDROCODONE-ACETAMINOPHEN 5-325 MG PO TABS
1.0000 | ORAL_TABLET | Freq: Once | ORAL | Status: AC
  Administered 2016-11-02: 1 via ORAL
  Filled 2016-11-02: qty 1

## 2016-11-02 MED ORDER — DICLOFENAC SODIUM 50 MG PO TBEC
50.0000 mg | DELAYED_RELEASE_TABLET | Freq: Once | ORAL | Status: AC
  Administered 2016-11-02: 50 mg via ORAL
  Filled 2016-11-02: qty 1

## 2016-11-02 MED ORDER — CYCLOBENZAPRINE HCL 5 MG PO TABS: 5.0000 mg | ORAL_TABLET | Freq: Three times a day (TID) | ORAL | 0 refills | Status: DC | PRN

## 2016-11-02 MED ORDER — DICLOFENAC SODIUM 50 MG PO TBEC: 50.0000 mg | DELAYED_RELEASE_TABLET | Freq: Two times a day (BID) | ORAL | 0 refills | Status: DC

## 2016-11-02 NOTE — ED Notes (Signed)
Pharmacy called about sending Voltaren to [facility], states they will send it.

## 2016-11-02 NOTE — ED Provider Notes (Signed)
[facility] Emergency Department Provider Note ____________________________________________  Time seen: 1816  I have reviewed the triage vital signs and the nursing notes.  HISTORY  Chief Complaint  Knee Pain  HPI Karen Lindsey is a 51 y.o. female presents to the ED for evaluation of pain to the right knee and leg. She slipped on a wet floor and nearly fell, at work last night. She had arthroscopic surgery to the knee some years ago. She denies any other injury at this time.  Past Medical History:  Diagnosis Date  . Arthritis    rheumatoid  . Diabetes mellitus without complication (HCC)   . Hypertension   . Neuropathy   . RA (rheumatoid arthritis) (HCC)     There are no active problems to display for this patient.   Past Surgical History:  Procedure Laterality Date  . ABDOMINAL HYSTERECTOMY    . CESAREAN SECTION    . CHOLECYSTECTOMY    . KNEE SURGERY    . MANDIBLE FRACTURE SURGERY      Prior to Admission medications   Medication Sig Start Date End Date Taking? Authorizing Provider  amoxicillin (AMOXIL) 500 MG tablet Take 1 tablet (500 mg total) by mouth 3 (three) times daily. 06/09/16   Chinita Pester, FNP  baclofen (LIORESAL) 10 MG tablet Take 1 tablet (10 mg total) by mouth 3 (three) times daily. 10/20/15   Chinita Pester, FNP  benzonatate (TESSALON) 200 MG capsule Take 1 capsule (200 mg total) by mouth 3 (three) times daily as needed for cough. 04/28/15   [name], MD  cyclobenzaprine (FLEXERIL) 5 MG tablet Take 1 tablet (5 mg total) by mouth 3 (three) times daily as needed for muscle spasms. 11/02/16   Radiance Deady V Bacon Anglea Gordner, PA-C  diclofenac (VOLTAREN) 50 MG EC tablet Take 1 tablet (50 mg total) by mouth 2 (two) times daily. 11/02/16   Tomasita Beevers V Bacon Hadlea Furuya, PA-C  gabapentin (NEURONTIN) 300 MG capsule Take 300 mg by mouth 3 (three) times daily.    Historical Provider, MD  insulin aspart protamine- aspart (NOVOLOG MIX 70/30) (70-30) 100  UNIT/ML injection Inject 20-30 Units into the skin 3 (three) times daily before meals.     Historical Provider, MD  insulin glargine (LANTUS) 100 UNIT/ML injection Inject 40 Units into the skin at bedtime.     Historical Provider, MD  insulin NPH Human (HUMULIN N) 100 UNIT/ML injection Inject 0.4 mLs (40 Units total) into the skin at bedtime. 05/02/16   [name], MD  insulin NPH-regular Human (NOVOLIN 70/30) (70-30) 100 UNIT/ML injection 20-30 units [place] before breakfast and dinner  Check glucose prior to usage 09/13/16   Myrna Blazer, MD  ketorolac (TORADOL) 10 MG tablet Take 1 tablet (10 mg total) by mouth every 8 (eight) hours as needed. Patient taking differently: Take 10 mg by mouth every 8 (eight) hours as needed for moderate pain.  05/12/15   [name], MD  LORazepam (ATIVAN) 0.5 MG tablet Take 1 tablet (0.5 mg total) by mouth every 8 (eight) hours as needed for anxiety. 03/12/15   Darien Ramus, MD  MAGNESIUM PO Take 1 tablet by mouth daily.    Historical Provider, MD  meloxicam (MOBIC) 15 MG tablet Take 1 tablet (15 mg total) by mouth daily. 10/20/15   Chinita Pester, FNP  metFORMIN (GLUCOPHAGE) 500 MG tablet Take 500 mg by mouth 2 (two) times daily with a meal.    Historical Provider, MD  methadone (DOLOPHINE) 5 MG/5ML solution Take 120 mg by mouth daily.     Historical Provider, MD  Multiple Vitamin (MULTIVITAMIN WITH MINERALS) TABS tablet Take 1 tablet by mouth daily.    Historical Provider, MD  ondansetron (ZOFRAN) 4 MG tablet Take 1 tablet (4 mg total) by mouth every 8 (eight) hours as needed for nausea or vomiting. 10/27/15   Phineas Semen, MD  POTASSIUM PO Take 1 tablet by mouth daily.    Historical Provider, MD  promethazine (PHENERGAN) 12.5 MG tablet Take 1 tablet (12.5 mg total) by mouth every 6 (six) hours as needed for nausea or vomiting. 02/15/16   Willy Eddy, MD    Allergies Ultram Marcia Brash hcl]  History reviewed. No pertinent family  history.  Social History Social History  Substance Use Topics  . Smoking status: Current Every Day Smoker    Packs/day: 0.50    Types: Cigarettes  . Smokeless tobacco: Never Used  . Alcohol use No    Review of Systems  Constitutional: Negative for fever. Cardiovascular: Negative for chest pain. Respiratory: Negative for shortness of breath. Musculoskeletal: Negative for back pain. Right knee pain.  Skin: Negative for rash. Neurological: Negative for headaches, focal weakness or numbness. ____________________________________________  PHYSICAL EXAM:  VITAL SIGNS: ED Triage Vitals  Enc Vitals Group     BP 11/02/16 1707 (!) 115/59     Pulse Rate 11/02/16 1707 71     Resp 11/02/16 1707 18     Temp 11/02/16 1707 98 F (36.7 C)     Temp Source 11/02/16 1707 Oral     SpO2 11/02/16 1707 94 %     Weight 11/02/16 1707 142 lb (64.4 kg)     Height 11/02/16 1707 5\' 1"  (1.549 m)     Head Circumference --      Peak Flow --      Pain Score 11/02/16 1706 7     Pain Loc --      Pain Edu? --      Excl. in GC? --     Constitutional: Alert and oriented. Well appearing and in no distress. Head: Normocephalic and atraumatic. Cardiovascular: Normal rate, regular rhythm. Normal distal pulses. Respiratory: Normal respiratory effort. No wheezes/rales/rhonchi. Musculoskeletal: Right knee without obvious deformity, dislocation, or effusion. Normal patella tracking. No valgus or varus joint stress. No popliteal space fullness, no calf or achilles tenderness. Normal ankle exam. Nontender with normal range of motion in all extremities.  Neurologic:  Normal gait without ataxia. Normal speech and language. No gross focal neurologic deficits are appreciated. Skin:  Skin is warm, dry and intact. No rash noted. ____________________________________________   RADIOLOGY  Right Knee  IMPRESSION: No evidence of fracture or  dislocation. ____________________________________________  PROCEDURES  Voltaren 50 mg PO Norco 5-325 mg PO ____________________________________________  INITIAL IMPRESSION / ASSESSMENT AND PLAN / ED COURSE  Patient with a knee sprain after a near-fall. She is reassured by her negative x-ray. She is discharged with a prescription for Voltaren and Flexeril. She will follow-up with [facility] as needed.  ____________________________________________  FINAL CLINICAL IMPRESSION(S) / ED DIAGNOSES  Final diagnoses:  Sprain of right knee, unspecified ligament, initial encounter  Right calf pain      [facility], PA-C 11/06/16 0025    11/08/16, MD 11/08/16 1000

## 2016-11-02 NOTE — ED Triage Notes (Signed)
Pt slipped and fell on a wet floor at work last night. She states that she has had knee surgery on right knee and it is very painful today. Pt alert & oriented with NAD noted.

## 2016-11-02 NOTE — ED Notes (Addendum)
See triage note. States she fell last pm on wet floor  Having pain to right knee   Had surgery in past  No deformity note   States she will not file on WC

## 2016-11-02 NOTE — Discharge Instructions (Signed)
Your x-rays are negative for any fracture or dislocation. You have strain to the knee and calf. There is no signs of an ACL or meniscus injury. Wear the knee brace and immobilizer at work as needed. Follow-up with Dr. Joice Lofts for ongoing symptoms.

## 2016-11-21 ENCOUNTER — Encounter: Payer: Self-pay | Admitting: Emergency Medicine

## 2016-11-21 ENCOUNTER — Emergency Department
Admission: EM | Admit: 2016-11-21 | Discharge: 2016-11-21 | Disposition: A | Discharge status: Home / Self Care | Payer: Self-pay | Attending: Emergency Medicine | Admitting: Emergency Medicine

## 2016-11-21 DIAGNOSIS — G629 Polyneuropathy, unspecified: Secondary | ICD-10-CM

## 2016-11-21 DIAGNOSIS — F17210 Nicotine dependence, cigarettes, uncomplicated: Secondary | ICD-10-CM | POA: Insufficient documentation

## 2016-11-21 DIAGNOSIS — I10 Essential (primary) hypertension: Secondary | ICD-10-CM | POA: Insufficient documentation

## 2016-11-21 DIAGNOSIS — Z760 Encounter for issue of repeat prescription: Secondary | ICD-10-CM | POA: Insufficient documentation

## 2016-11-21 DIAGNOSIS — Z794 Long term (current) use of insulin: Secondary | ICD-10-CM | POA: Insufficient documentation

## 2016-11-21 DIAGNOSIS — E1140 Type 2 diabetes mellitus with diabetic neuropathy, unspecified: Secondary | ICD-10-CM | POA: Insufficient documentation

## 2016-11-21 DIAGNOSIS — Z79899 Other long term (current) drug therapy: Secondary | ICD-10-CM | POA: Insufficient documentation

## 2016-11-21 MED ORDER — METHYLPREDNISOLONE SODIUM SUCC 125 MG IJ SOLR
125.0000 mg | Freq: Once | INTRAMUSCULAR | Status: AC
  Administered 2016-11-21: 125 mg via INTRAMUSCULAR
  Filled 2016-11-21: qty 2

## 2016-11-21 NOTE — ED Provider Notes (Signed)
[facility] Emergency Department Provider Note  ____________________________________________  Time seen: Approximately 7:32 PM  I have reviewed the triage vital signs and the nursing notes.   HISTORY  Chief Complaint Generalized Body Aches and Medication Refill    HPI Karen Lindsey is a 51 y.o. female presenting to the emergency department with diabetic neuropathy and medication refills for Invokana, gabapentin and lisinopril. Patient states that she has an appointment with her primary care provider next week and just needs refills temporarily. Patient states that her diabetic neuropathy has been chronic and has noticed no new changes. Patient denies new weakness. Patient states that her blood glucose level was in the low 200s today. Patient denies associated chest pain, chest tightness, shortness of breath, nausea, vomiting and abdominal pain.   Past Medical History:  Diagnosis Date  . Arthritis    rheumatoid  . Diabetes mellitus without complication (HCC)   . Hypertension   . Neuropathy   . RA (rheumatoid arthritis) (HCC)     There are no active problems to display for this patient.   Past Surgical History:  Procedure Laterality Date  . ABDOMINAL HYSTERECTOMY    . CESAREAN SECTION    . CHOLECYSTECTOMY    . KNEE SURGERY    . MANDIBLE FRACTURE SURGERY      Prior to Admission medications   Medication Sig Start Date End Date Taking? Authorizing Provider  amoxicillin (AMOXIL) 500 MG tablet Take 1 tablet (500 mg total) by mouth 3 (three) times daily. 06/09/16   Triplett, Rulon Eisenmenger B, FNP  baclofen (LIORESAL) 10 MG tablet Take 1 tablet (10 mg total) by mouth 3 (three) times daily. 10/20/15   Triplett, Cari B, FNP  benzonatate (TESSALON) 200 MG capsule Take 1 capsule (200 mg total) by mouth 3 (three) times daily as needed for cough. 04/28/15   [name], MD  cyclobenzaprine (FLEXERIL) 5 MG tablet Take 1 tablet (5 mg total) by mouth 3 (three) times  daily as needed for muscle spasms. 11/02/16   Menshew, Charlesetta Ivory, PA-C  diclofenac (VOLTAREN) 50 MG EC tablet Take 1 tablet (50 mg total) by mouth 2 (two) times daily. 11/02/16   Menshew, Charlesetta Ivory, PA-C  gabapentin (NEURONTIN) 300 MG capsule Take 300 mg by mouth 3 (three) times daily.    [provider]  insulin aspart protamine- aspart (NOVOLOG MIX 70/30) (70-30) 100 UNIT/ML injection Inject 20-30 Units into the skin 3 (three) times daily before meals.     [provider]  insulin glargine (LANTUS) 100 UNIT/ML injection Inject 40 Units into the skin at bedtime.     [provider]  insulin NPH Human (HUMULIN N) 100 UNIT/ML injection Inject 0.4 mLs (40 Units total) into the skin at bedtime. 05/02/16   [name], MD  insulin NPH-regular Human (NOVOLIN 70/30) (70-30) 100 UNIT/ML injection 20-30 units [place] before breakfast and dinner  Check glucose prior to usage 09/13/16   Schaevitz, Myra Rude, MD  ketorolac (TORADOL) 10 MG tablet Take 1 tablet (10 mg total) by mouth every 8 (eight) hours as needed. Patient taking differently: Take 10 mg by mouth every 8 (eight) hours as needed for moderate pain.  05/12/15   [name], MD  LORazepam (ATIVAN) 0.5 MG tablet Take 1 tablet (0.5 mg total) by mouth every 8 (eight) hours as needed for anxiety. 03/12/15   Darien Ramus, MD  MAGNESIUM PO Take 1 tablet by mouth daily.    [provider]  meloxicam (MOBIC) 15 MG tablet Take 1 tablet (15 mg total) by mouth daily. 10/20/15   Triplett, Rulon Eisenmenger B, FNP  metFORMIN (GLUCOPHAGE) 500 MG tablet Take 500 mg by mouth 2 (two) times daily with a meal.    [provider]  methadone (DOLOPHINE) 5 MG/5ML solution Take 120 mg by mouth daily.     [provider]  Multiple Vitamin (MULTIVITAMIN WITH MINERALS) TABS tablet Take 1 tablet by mouth daily.    [provider]  ondansetron (ZOFRAN) 4 MG tablet Take 1 tablet (4 mg total) by mouth every 8  (eight) hours as needed for nausea or vomiting. 10/27/15   Phineas Semen, MD  POTASSIUM PO Take 1 tablet by mouth daily.    [provider]  promethazine (PHENERGAN) 12.5 MG tablet Take 1 tablet (12.5 mg total) by mouth every 6 (six) hours as needed for nausea or vomiting. 02/15/16   Willy Eddy, MD    Allergies Ultram Marcia Brash hcl]  History reviewed. No pertinent family history.  Social History Social History  Substance Use Topics  . Smoking status: Current Every Day Smoker    Packs/day: 0.50    Types: Cigarettes  . Smokeless tobacco: Never Used  . Alcohol use No     Review of Systems  Constitutional: No fever/chills Eyes: No visual changes. No discharge ENT: No upper respiratory complaints. Cardiovascular: no chest pain. Respiratory: no cough. No SOB. Gastrointestinal: No abdominal pain.  No nausea, no vomiting.  No diarrhea.  No constipation. Genitourinary: Negative for dysuria. No hematuria Musculoskeletal: Negative for musculoskeletal pain. Skin: Negative for rash, abrasions, lacerations, ecchymosis. Neurological: Patient has diabetic neuropathy   ____________________________________________   PHYSICAL EXAM:  VITAL SIGNS: ED Triage Vitals  Enc Vitals Group     BP 11/21/16 1755 117/63     Pulse Rate 11/21/16 1755 71     Resp 11/21/16 1755 16     Temp 11/21/16 1755 98.2 F (36.8 C)     Temp Source 11/21/16 1755 Oral     SpO2 11/21/16 1755 95 %     Weight 11/21/16 1755 140 lb (63.5 kg)     Height 11/21/16 1755 1' (0.305 m)     Head Circumference --      Peak Flow --      Pain Score 11/21/16 1805 8     Pain Loc --      Pain Edu? --      Excl. in GC? --      Constitutional: Alert and oriented. Well appearing and in no acute distress. Eyes: Conjunctivae are normal. PERRL. EOMI. Head: Atraumatic. ENT:      Ears: Tympanic membranes are pearly bilaterally.      Nose: No congestion/rhinnorhea.      Mouth/Throat: Mucous membranes are moist.   Hematological/Lymphatic/Immunilogical: No cervical lymphadenopathy. Cardiovascular: Normal rate, regular rhythm. Normal S1 and S2.  Good peripheral circulation. Respiratory: Normal respiratory effort without tachypnea or retractions. Lungs CTAB. Good air entry to the bases with no decreased or absent breath sounds. Gastrointestinal: Bowel sounds 4 quadrants. Soft and nontender to palpation. No guarding or rigidity. No palpable masses. No distention. No CVA tenderness. Musculoskeletal: Full range of motion to all extremities. No gross deformities appreciated. Neurologic:  Normal speech and language. No gross focal neurologic deficits are appreciated.  Skin:  Skin is warm, dry and intact. No rash noted. Psychiatric: Mood and affect are normal. Speech and behavior are normal. Patient exhibits appropriate insight and judgement.   ____________________________________________   LABS (  all labs ordered are listed, but only abnormal results are displayed)  Labs Reviewed - No data to display ____________________________________________  EKG   ____________________________________________  RADIOLOGY   No results found.  ____________________________________________    PROCEDURES  Procedure(s) performed:    Procedures    Medications - No data to display   ____________________________________________   INITIAL IMPRESSION / ASSESSMENT AND PLAN / ED COURSE  Pertinent labs & imaging results that were available during my care of the patient were reviewed by me and considered in my medical decision making (see chart for details).  Review of the [place] CSRS was performed in accordance of the [facility] prior to dispensing any controlled drugs.     Assessment and plan: Neuropathy:  Patient presents to the emergency department with chronic diabetic radiculopathy and requests for medication refills. Patient was advised that due to the complexity of her diabetic pharmacologic regimen,  refills would not be provided at this emergency department encounter. Patient was advised to seek care this week with her primary care provider to obtain appropriate refills. Patient was given an injection of Solu-Medrol in the emergency department. Vital signs were reassuring prior to discharge. All patient questions were answered.   ____________________________________________  FINAL CLINICAL IMPRESSION(S) / ED DIAGNOSES  Final diagnoses:  None      NEW MEDICATIONS STARTED DURING THIS VISIT:  New Prescriptions   No medications on file        This chart was dictated using voice recognition software/Dragon. Despite best efforts to proofread, errors can occur which can change the meaning. Any change was purely unintentional.    Orvil Feil, PA-C 11/24/16 1906    Phineas Semen, MD 11/29/16 [phone]
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
## 2016-12-10 ENCOUNTER — Emergency Department: Payer: Worker's Compensation

## 2016-12-10 ENCOUNTER — Encounter: Payer: Self-pay | Admitting: Emergency Medicine

## 2016-12-10 ENCOUNTER — Emergency Department
Admission: EM | Admit: 2016-12-10 | Discharge: 2016-12-10 | Disposition: A | Discharge status: Home / Self Care | Payer: Worker's Compensation | Attending: Emergency Medicine | Admitting: Emergency Medicine

## 2016-12-10 DIAGNOSIS — E119 Type 2 diabetes mellitus without complications: Secondary | ICD-10-CM | POA: Insufficient documentation

## 2016-12-10 DIAGNOSIS — Y990 Civilian activity done for income or pay: Secondary | ICD-10-CM | POA: Diagnosis not present

## 2016-12-10 DIAGNOSIS — Y939 Activity, unspecified: Secondary | ICD-10-CM | POA: Diagnosis not present

## 2016-12-10 DIAGNOSIS — F17210 Nicotine dependence, cigarettes, uncomplicated: Secondary | ICD-10-CM | POA: Insufficient documentation

## 2016-12-10 DIAGNOSIS — M79605 Pain in left leg: Secondary | ICD-10-CM | POA: Diagnosis present

## 2016-12-10 DIAGNOSIS — Y929 Unspecified place or not applicable: Secondary | ICD-10-CM | POA: Insufficient documentation

## 2016-12-10 DIAGNOSIS — S8012XA Contusion of left lower leg, initial encounter: Secondary | ICD-10-CM | POA: Diagnosis not present

## 2016-12-10 DIAGNOSIS — W19XXXA Unspecified fall, initial encounter: Secondary | ICD-10-CM | POA: Insufficient documentation

## 2016-12-10 DIAGNOSIS — Z794 Long term (current) use of insulin: Secondary | ICD-10-CM | POA: Diagnosis not present

## 2016-12-10 DIAGNOSIS — Z79899 Other long term (current) drug therapy: Secondary | ICD-10-CM | POA: Diagnosis not present

## 2016-12-10 DIAGNOSIS — I10 Essential (primary) hypertension: Secondary | ICD-10-CM | POA: Diagnosis not present

## 2016-12-10 DIAGNOSIS — R0602 Shortness of breath: Secondary | ICD-10-CM | POA: Insufficient documentation

## 2016-12-10 MED ORDER — NAPROXEN 500 MG PO TABS
500.0000 mg | ORAL_TABLET | Freq: Once | ORAL | Status: AC
  Administered 2016-12-10: 500 mg via ORAL

## 2016-12-10 MED ORDER — NAPROXEN 500 MG PO TABS
ORAL_TABLET | ORAL | Status: AC
  Filled 2016-12-10: qty 1

## 2016-12-10 MED ORDER — OXYCODONE-ACETAMINOPHEN 5-325 MG PO TABS
1.0000 | ORAL_TABLET | Freq: Once | ORAL | Status: AC
  Administered 2016-12-10: 1 via ORAL
  Filled 2016-12-10: qty 1

## 2016-12-10 MED ORDER — OXYCODONE-ACETAMINOPHEN 7.5-325 MG PO TABS: 1.0000 | ORAL_TABLET | Freq: Four times a day (QID) | ORAL | 0 refills | Status: DC | PRN

## 2016-12-10 MED ORDER — NAPROXEN 500 MG PO TABS: 500.0000 mg | ORAL_TABLET | Freq: Once | ORAL | Status: DC

## 2016-12-10 NOTE — ED Provider Notes (Signed)
[facility] Emergency Department Provider Note   ____________________________________________   None    (approximate)  I have reviewed the triage vital signs and the nursing notes.   HISTORY  Chief Complaint Leg Pain    HPI Karen Lindsey is a 51 y.o. female patient complaining of left leg pain second to a fall at work on 12/01/2016. Patient was seen by Dr. Her work place and was cleared to go back to work. Patient  is upset because no imaging of her leg and she was cleared to go back to work. Patient stated the pain wax and wane at times make her nauseated.  Patient also complained of shortness of breath in the past 3 days.Patient stated pain increases with ambulation and weightbearing. Patient rates the pain as 8/10. No palliative measures for complaint.Patient described a pain as "sharp".   Past Medical History:  Diagnosis Date  . Arthritis    rheumatoid  . Diabetes mellitus without complication (HCC)   . Hypertension   . Neuropathy   . RA (rheumatoid arthritis) (HCC)     There are no active problems to display for this patient.   Past Surgical History:  Procedure Laterality Date  . ABDOMINAL HYSTERECTOMY    . CESAREAN SECTION    . CHOLECYSTECTOMY    . KNEE SURGERY    . MANDIBLE FRACTURE SURGERY      Prior to Admission medications   Medication Sig Start Date End Date Taking? Authorizing Provider  amoxicillin (AMOXIL) 500 MG tablet Take 1 tablet (500 mg total) by mouth 3 (three) times daily. 06/09/16   Triplett, Rulon Eisenmenger B, FNP  baclofen (LIORESAL) 10 MG tablet Take 1 tablet (10 mg total) by mouth 3 (three) times daily. 10/20/15   Triplett, Cari B, FNP  benzonatate (TESSALON) 200 MG capsule Take 1 capsule (200 mg total) by mouth 3 (three) times daily as needed for cough. 04/28/15   [name], MD  cyclobenzaprine (FLEXERIL) 5 MG tablet Take 1 tablet (5 mg total) by mouth 3 (three) times daily as needed for muscle spasms. 11/02/16    Menshew, Charlesetta Ivory, PA-C  diclofenac (VOLTAREN) 50 MG EC tablet Take 1 tablet (50 mg total) by mouth 2 (two) times daily. 11/02/16   Menshew, Charlesetta Ivory, PA-C  gabapentin (NEURONTIN) 300 MG capsule Take 300 mg by mouth 3 (three) times daily.    [provider]  insulin aspart protamine- aspart (NOVOLOG MIX 70/30) (70-30) 100 UNIT/ML injection Inject 20-30 Units into the skin 3 (three) times daily before meals.     [provider]  insulin glargine (LANTUS) 100 UNIT/ML injection Inject 40 Units into the skin at bedtime.     [provider]  insulin NPH Human (HUMULIN N) 100 UNIT/ML injection Inject 0.4 mLs (40 Units total) into the skin at bedtime. 05/02/16   [name], MD  insulin NPH-regular Human (NOVOLIN 70/30) (70-30) 100 UNIT/ML injection 20-30 units [place] before breakfast and dinner  Check glucose prior to usage 09/13/16   Schaevitz, Myra Rude, MD  ketorolac (TORADOL) 10 MG tablet Take 1 tablet (10 mg total) by mouth every 8 (eight) hours as needed. Patient taking differently: Take 10 mg by mouth every 8 (eight) hours as needed for moderate pain.  05/12/15   [name], MD  LORazepam (ATIVAN) 0.5 MG tablet Take 1 tablet (0.5 mg total) by mouth every 8 (eight) hours as needed for anxiety. 03/12/15   Darien Ramus, MD  MAGNESIUM PO Take 1 tablet by mouth daily.    [provider]  meloxicam (MOBIC) 15 MG tablet Take 1 tablet (15 mg total) by mouth daily. 10/20/15   Triplett, Rulon Eisenmenger B, FNP  metFORMIN (GLUCOPHAGE) 500 MG tablet Take 500 mg by mouth 2 (two) times daily with a meal.    [provider]  methadone (DOLOPHINE) 5 MG/5ML solution Take 120 mg by mouth daily.     [provider]  Multiple Vitamin (MULTIVITAMIN WITH MINERALS) TABS tablet Take 1 tablet by mouth daily.    [provider]  ondansetron (ZOFRAN) 4 MG tablet Take 1 tablet (4 mg total) by mouth every 8 (eight) hours as needed for nausea or vomiting.  10/27/15   Phineas Semen, MD  oxyCODONE-acetaminophen (PERCOCET) 7.5-325 MG tablet Take 1 tablet by mouth every 6 (six) hours as needed for severe pain. 12/10/16   Joni Reining, PA-C  POTASSIUM PO Take 1 tablet by mouth daily.    [provider]  promethazine (PHENERGAN) 12.5 MG tablet Take 1 tablet (12.5 mg total) by mouth every 6 (six) hours as needed for nausea or vomiting. 02/15/16   Willy Eddy, MD    Allergies Ultram Marcia Brash hcl]  No family history on file.  Social History Social History  Substance Use Topics  . Smoking status: Current Every Day Smoker    Packs/day: 0.50    Types: Cigarettes  . Smokeless tobacco: Never Used  . Alcohol use No    Review of Systems  Constitutional: No fever/chills Eyes: No visual changes. ENT: No sore throat. Cardiovascular: Denies chest pain. Respiratory: states shortness of breath. Gastrointestinal: No abdominal pain.  No nausea, no vomiting.  No diarrhea.  No constipation. Genitourinary: Negative for dysuria. Musculoskeletal: left lower leg pain Skin: Negative for rash. Neurological: Negative for headaches, focal weakness or numbness.   ____________________________________________   PHYSICAL EXAM:  VITAL SIGNS: ED Triage Vitals  Enc Vitals Group     BP 12/10/16 1526 112/62     Pulse Rate 12/10/16 1526 70     Resp 12/10/16 1526 16     Temp 12/10/16 1526 98 F (36.7 C)     Temp Source 12/10/16 1526 Oral     SpO2 12/10/16 1526 94 %     Weight 12/10/16 1527 145 lb (65.8 kg)     Height 12/10/16 1527 5\' 1"  (1.549 m)     Head Circumference --      Peak Flow --      Pain Score 12/10/16 1526 8     Pain Loc --      Pain Edu? --      Excl. in GC? --     Constitutional: Alert and oriented. Well appearing and in no acute distress. Cardiovascular: Normal rate, regular rhythm. Grossly normal heart sounds.  Good peripheral circulation. Respiratory: Normal respiratory effort.  No retractions. Lungs  CTAB. Gastrointestinal: Soft and nontender. No distention. No abdominal bruits. No CVA tenderness. Musculoskeletal: no obvious deformity. Patient is tender palpation anterior distal third of the tibia. Neurologic:  Normal speech and language. No gross focal neurologic deficits are appreciated. No gait instability. Skin:  Skin is warm, dry and intact. No rash noted. Psychiatric: Mood and affect are normal. Speech and behavior are normal.  ____________________________________________   LABS (all labs ordered are listed, but only abnormal results are displayed)  Labs Reviewed - No data to display ____________________________________________  EKG   ____________________________________________  RADIOLOGY  No acute findings on x-ray and ultrasound  of the left lower extremity. ____________________________________________   PROCEDURES  Procedure(s) performed: None  Procedures  Critical Care performed: No  ____________________________________________   INITIAL IMPRESSION / ASSESSMENT AND PLAN / ED COURSE  Pertinent labs & imaging results that were available during my care of the patient were reviewed by me and considered in my medical decision making (see chart for details).  Left leg contusion. Discussed negative findings of x-ray and ultrasounds of the left lower extremity. Patient given discharge care instructions. Patient advised to follow-up with family doctor. Patient given a work note.     ____________________________________________   FINAL CLINICAL IMPRESSION(S) / ED DIAGNOSES  Final diagnoses:  Contusion of left leg, initial encounter      NEW MEDICATIONS STARTED DURING THIS VISIT:  New Prescriptions   OXYCODONE-ACETAMINOPHEN (PERCOCET) 7.5-325 MG TABLET    Take 1 tablet by mouth every 6 (six) hours as needed for severe pain.     Note:  This document was prepared using Dragon voice recognition software and may include unintentional dictation  errors.    Joni Reining, PA-C 12/10/16 1813    Jene Every, MD 12/13/16 [phone]

## 2016-12-10 NOTE — ED Notes (Signed)
Pt verbalized understanding of discharge instructions. NAD at this time. 

## 2016-12-10 NOTE — ED Triage Notes (Signed)
Patient is complaining of left leg pain since she fell at work on 12/01/16.  Patient states she notified the doctor's at her workplace [facility] person) and they looked at patient's leg and told her she was cleared to go back to work.  Patient states, "they didn't take an x-ray or anything."  Patient reports history of peripheral neuropathy.  Patient reports the pain is so severe it occasionally makes her nauseous.  Patient appears tired during triage.

## 2016-12-24 ENCOUNTER — Emergency Department
Admission: EM | Admit: 2016-12-24 | Discharge: 2016-12-24 | Disposition: A | Discharge status: Home / Self Care | Payer: Self-pay | Attending: Emergency Medicine | Admitting: Emergency Medicine

## 2016-12-24 ENCOUNTER — Encounter: Payer: Self-pay | Admitting: Emergency Medicine

## 2016-12-24 ENCOUNTER — Emergency Department: Payer: Self-pay

## 2016-12-24 DIAGNOSIS — F17210 Nicotine dependence, cigarettes, uncomplicated: Secondary | ICD-10-CM | POA: Insufficient documentation

## 2016-12-24 DIAGNOSIS — Z7984 Long term (current) use of oral hypoglycemic drugs: Secondary | ICD-10-CM | POA: Insufficient documentation

## 2016-12-24 DIAGNOSIS — S8012XD Contusion of left lower leg, subsequent encounter: Secondary | ICD-10-CM | POA: Insufficient documentation

## 2016-12-24 DIAGNOSIS — Z79899 Other long term (current) drug therapy: Secondary | ICD-10-CM | POA: Insufficient documentation

## 2016-12-24 DIAGNOSIS — W01198D Fall on same level from slipping, tripping and stumbling with subsequent striking against other object, subsequent encounter: Secondary | ICD-10-CM | POA: Insufficient documentation

## 2016-12-24 DIAGNOSIS — Z794 Long term (current) use of insulin: Secondary | ICD-10-CM | POA: Insufficient documentation

## 2016-12-24 DIAGNOSIS — S8012XS Contusion of left lower leg, sequela: Secondary | ICD-10-CM

## 2016-12-24 DIAGNOSIS — E119 Type 2 diabetes mellitus without complications: Secondary | ICD-10-CM | POA: Insufficient documentation

## 2016-12-24 DIAGNOSIS — I10 Essential (primary) hypertension: Secondary | ICD-10-CM | POA: Insufficient documentation

## 2016-12-24 NOTE — ED Notes (Signed)
Pt assisted to wheelchair upon arrival; c/o left leg pain; was seen here on 12/10/16 for same and diagnosed with leg contusion; says pain is not any better;

## 2016-12-24 NOTE — ED Triage Notes (Signed)
Patient injured her left lower leg about 2 weeks ago at work. Patient states that she was seen here and had an xray and [place] that were both negative. Patient states that she continues to have pain to her leg. Patient with abrasion to leg with some bruising.

## 2016-12-24 NOTE — ED Provider Notes (Signed)
[facility] Emergency Department Provider Note  ____________________________________________  Time seen: Approximately 8:18 PM  I have reviewed the triage vital signs and the nursing notes.   HISTORY  Chief Complaint Leg Pain    HPI Karen Lindsey is a 51 y.o. female that presented to the emergency department with left lower leg pain for 2 weeks. She was pushing a jug of water at work and tripped and landed on the trolley. She describes the pain as a constant aching with burning and intermittent sharp pains. She feels like it occasionally swells but it is not today. The pain keeps her up at night. Pressure and movement make the pain worse. Ibuprofen does not help the pain. She has a history of rheumatoid arthritis. She is worried about a blood clot. She denies fever, shortness of breath, chest pain, vomiting, abdominal pain.   Past Medical History:  Diagnosis Date  . Arthritis    rheumatoid  . Diabetes mellitus without complication (HCC)   . Hypertension   . Neuropathy   . RA (rheumatoid arthritis) (HCC)     There are no active problems to display for this patient.   Past Surgical History:  Procedure Laterality Date  . ABDOMINAL HYSTERECTOMY    . CESAREAN SECTION    . CHOLECYSTECTOMY    . KNEE SURGERY    . MANDIBLE FRACTURE SURGERY      Prior to Admission medications   Medication Sig Start Date End Date Taking? Authorizing Provider  amoxicillin (AMOXIL) 500 MG tablet Take 1 tablet (500 mg total) by mouth 3 (three) times daily. 06/09/16   Triplett, Rulon Eisenmenger B, FNP  baclofen (LIORESAL) 10 MG tablet Take 1 tablet (10 mg total) by mouth 3 (three) times daily. 10/20/15   Triplett, Cari B, FNP  benzonatate (TESSALON) 200 MG capsule Take 1 capsule (200 mg total) by mouth 3 (three) times daily as needed for cough. 04/28/15   [name], MD  cyclobenzaprine (FLEXERIL) 5 MG tablet Take 1 tablet (5 mg total) by mouth 3 (three) times daily as needed for  muscle spasms. 11/02/16   Menshew, Charlesetta Ivory, PA-C  diclofenac (VOLTAREN) 50 MG EC tablet Take 1 tablet (50 mg total) by mouth 2 (two) times daily. 11/02/16   Menshew, Charlesetta Ivory, PA-C  gabapentin (NEURONTIN) 300 MG capsule Take 300 mg by mouth 3 (three) times daily.    [provider]  insulin aspart protamine- aspart (NOVOLOG MIX 70/30) (70-30) 100 UNIT/ML injection Inject 20-30 Units into the skin 3 (three) times daily before meals.     [provider]  insulin glargine (LANTUS) 100 UNIT/ML injection Inject 40 Units into the skin at bedtime.     [provider]  insulin NPH Human (HUMULIN N) 100 UNIT/ML injection Inject 0.4 mLs (40 Units total) into the skin at bedtime. 05/02/16   [name], MD  insulin NPH-regular Human (NOVOLIN 70/30) (70-30) 100 UNIT/ML injection 20-30 units [place] before breakfast and dinner  Check glucose prior to usage 09/13/16   Schaevitz, Myra Rude, MD  ketorolac (TORADOL) 10 MG tablet Take 1 tablet (10 mg total) by mouth every 8 (eight) hours as needed. Patient taking differently: Take 10 mg by mouth every 8 (eight) hours as needed for moderate pain.  05/12/15   [name], MD  LORazepam (ATIVAN) 0.5 MG tablet Take 1 tablet (0.5 mg total) by mouth every 8 (eight) hours as needed for anxiety. 03/12/15   Darien Ramus, MD  MAGNESIUM PO Take 1 tablet by mouth daily.    [provider]  meloxicam (MOBIC) 15 MG tablet Take 1 tablet (15 mg total) by mouth daily. 10/20/15   Triplett, Rulon Eisenmenger B, FNP  metFORMIN (GLUCOPHAGE) 500 MG tablet Take 500 mg by mouth 2 (two) times daily with a meal.    [provider]  methadone (DOLOPHINE) 5 MG/5ML solution Take 120 mg by mouth daily.     [provider]  Multiple Vitamin (MULTIVITAMIN WITH MINERALS) TABS tablet Take 1 tablet by mouth daily.    [provider]  ondansetron (ZOFRAN) 4 MG tablet Take 1 tablet (4 mg total) by mouth every 8 (eight) hours as  needed for nausea or vomiting. 10/27/15   Phineas Semen, MD  oxyCODONE-acetaminophen (PERCOCET) 7.5-325 MG tablet Take 1 tablet by mouth every 6 (six) hours as needed for severe pain. 12/10/16   Joni Reining, PA-C  POTASSIUM PO Take 1 tablet by mouth daily.    [provider]  promethazine (PHENERGAN) 12.5 MG tablet Take 1 tablet (12.5 mg total) by mouth every 6 (six) hours as needed for nausea or vomiting. 02/15/16   Willy Eddy, MD    Allergies Ultram Marcia Brash hcl]  No family history on file.  Social History Social History  Substance Use Topics  . Smoking status: Current Every Day Smoker    Packs/day: 0.50    Types: Cigarettes  . Smokeless tobacco: Never Used  . Alcohol use No     Review of Systems  Constitutional: No fever/chills Cardiovascular: No chest pain. Respiratory: No SOB. Gastrointestinal: No abdominal pain.  No vomiting.  Musculoskeletal: Positive for leg pain. Skin: Negative for rash, ecchymosis. Positive for abrasion. Neurological: Negative for headaches, numbness or tingling   ____________________________________________   PHYSICAL EXAM:  VITAL SIGNS: ED Triage Vitals [12/24/16 1940]  Enc Vitals Group     BP 124/67     Pulse Rate 60     Resp 18     Temp 98.3 F (36.8 C)     Temp Source Oral     SpO2 99 %     Weight 144 lb (65.3 kg)     Height 5\' 1"  (1.549 m)     Head Circumference      Peak Flow      Pain Score 7     Pain Loc      Pain Edu?      Excl. in GC?      Constitutional: Alert and oriented. Well appearing and in no acute distress. Eyes: Conjunctivae are normal. PERRL. EOMI. Head: Atraumatic. ENT:      Ears:      Nose: No congestion/rhinnorhea.      Mouth/Throat: Mucous membranes are moist.  Neck: No stridor. Cardiovascular: Normal rate, regular rhythm.  Good peripheral circulation. 2+ dorsalis pedis pulses. Respiratory: Normal respiratory effort without tachypnea or retractions. Lungs CTAB. Good air entry to  the bases with no decreased or absent breath sounds. Musculoskeletal: Full range of motion to all extremities. No gross deformities appreciated. No calf tenderness to palpation. Neurologic:  Normal speech and language. No gross focal neurologic deficits are appreciated.  Skin:  Skin is warm, dry and intact. 1" x 1" contusion to lower left shin.   ____________________________________________   LABS (all labs ordered are listed, but only abnormal results are displayed)  Labs Reviewed - No data to display ____________________________________________  EKG   ____________________________________________  RADIOLOGY  Venous Img Lower Unilateral Left  Result Date:  12/24/2016 CLINICAL DATA:  Left lower extremity pain, discoloration. Post trauma. EXAM: LEFT LOWER EXTREMITY VENOUS DOPPLER ULTRASOUND TECHNIQUE: Gray-scale sonography with graded compression, as well as color Doppler and duplex ultrasound were performed to evaluate the lower extremity deep venous systems from the level of the common femoral vein and including the common femoral, femoral, profunda femoral, popliteal and calf veins including the posterior tibial, peroneal and gastrocnemius veins when visible. The superficial great saphenous vein was also interrogated. Spectral Doppler was utilized to evaluate flow at rest and with distal augmentation maneuvers in the common femoral, femoral and popliteal veins. COMPARISON:  None. FINDINGS: Contralateral Common Femoral Vein: Respiratory phasicity is normal and symmetric with the symptomatic side. No evidence of thrombus. Normal compressibility. Common Femoral Vein: No evidence of thrombus. Normal compressibility, respiratory phasicity and response to augmentation. Saphenofemoral Junction: No evidence of thrombus. Normal compressibility and flow on color Doppler imaging. Profunda Femoral Vein: No evidence of thrombus. Normal compressibility and flow on color Doppler imaging. Femoral Vein: No  evidence of thrombus. Normal compressibility, respiratory phasicity and response to augmentation. Popliteal Vein: No evidence of thrombus. Normal compressibility, respiratory phasicity and response to augmentation. Calf Veins: No evidence of thrombus. Normal compressibility and flow on color Doppler imaging. Superficial Great Saphenous Vein: No evidence of thrombus. Normal compressibility and flow on color Doppler imaging. Venous Reflux:  None. Other Findings:  None. IMPRESSION: No evidence of DVT within the left lower extremity. Electronically Signed   By: Ted Mcalpine M.D.   On: 12/24/2016 21:46    ____________________________________________    PROCEDURES  Procedure(s) performed:    Procedures    Medications - No data to display   ____________________________________________   INITIAL IMPRESSION / ASSESSMENT AND PLAN / ED COURSE  Pertinent labs & imaging results that were available during my care of the patient were reviewed by me and considered in my medical decision making (see chart for details).  Review of the [place] CSRS was performed in accordance of the [facility] prior to dispensing any controlled drugs.   Patient presented to the emergency department with left leg pain and contusion. Vital signs and exam are reassuring. Ultrasound negative for blood clot. X-ray 2 weeks ago was negative for acute bony abnormalities and patient has had no trauma since then. Patient is to follow up with PCP as directed. Patient is given ED precautions to return to the ED for any worsening or new symptoms.   ____________________________________________  FINAL CLINICAL IMPRESSION(S) / ED DIAGNOSES  Final diagnoses:  Contusion of left leg, sequela      NEW MEDICATIONS STARTED DURING THIS VISIT:  Discharge Medication List as of 12/24/2016 10:26 PM          This chart was dictated using voice recognition software/Dragon. Despite best efforts to proofread, errors can occur which can  change the meaning. Any change was purely unintentional.    Enid Derry, PA-C 12/24/16 2338    Jene Every, MD 12/24/16 [phone]

## 2017-05-09 ENCOUNTER — Emergency Department: Payer: Self-pay

## 2017-05-09 ENCOUNTER — Emergency Department
Admission: EM | Admit: 2017-05-09 | Discharge: 2017-05-09 | Disposition: A | Discharge status: Home / Self Care | Payer: Self-pay | Attending: Emergency Medicine | Admitting: Emergency Medicine

## 2017-05-09 ENCOUNTER — Encounter: Payer: Self-pay | Admitting: Emergency Medicine

## 2017-05-09 DIAGNOSIS — S161XXA Strain of muscle, fascia and tendon at neck level, initial encounter: Secondary | ICD-10-CM | POA: Insufficient documentation

## 2017-05-09 DIAGNOSIS — Z79899 Other long term (current) drug therapy: Secondary | ICD-10-CM | POA: Insufficient documentation

## 2017-05-09 DIAGNOSIS — I10 Essential (primary) hypertension: Secondary | ICD-10-CM | POA: Insufficient documentation

## 2017-05-09 DIAGNOSIS — E1165 Type 2 diabetes mellitus with hyperglycemia: Secondary | ICD-10-CM | POA: Insufficient documentation

## 2017-05-09 DIAGNOSIS — Y998 Other external cause status: Secondary | ICD-10-CM | POA: Insufficient documentation

## 2017-05-09 DIAGNOSIS — M25561 Pain in right knee: Secondary | ICD-10-CM | POA: Insufficient documentation

## 2017-05-09 DIAGNOSIS — F17210 Nicotine dependence, cigarettes, uncomplicated: Secondary | ICD-10-CM | POA: Insufficient documentation

## 2017-05-09 DIAGNOSIS — W19XXXA Unspecified fall, initial encounter: Secondary | ICD-10-CM

## 2017-05-09 DIAGNOSIS — Y939 Activity, unspecified: Secondary | ICD-10-CM | POA: Insufficient documentation

## 2017-05-09 DIAGNOSIS — R739 Hyperglycemia, unspecified: Secondary | ICD-10-CM

## 2017-05-09 DIAGNOSIS — Y929 Unspecified place or not applicable: Secondary | ICD-10-CM | POA: Insufficient documentation

## 2017-05-09 DIAGNOSIS — M79605 Pain in left leg: Secondary | ICD-10-CM | POA: Insufficient documentation

## 2017-05-09 DIAGNOSIS — W01198A Fall on same level from slipping, tripping and stumbling with subsequent striking against other object, initial encounter: Secondary | ICD-10-CM | POA: Insufficient documentation

## 2017-05-09 LAB — URINALYSIS, COMPLETE (UACMP) WITH MICROSCOPIC
BACTERIA UA: NONE SEEN
BILIRUBIN URINE: NEGATIVE
HGB URINE DIPSTICK: NEGATIVE
KETONES UR: NEGATIVE mg/dL
LEUKOCYTES UA: NEGATIVE
NITRITE: NEGATIVE
PH: 5 (ref 5.0–8.0)
Protein, ur: NEGATIVE mg/dL
RBC / HPF: NONE SEEN RBC/hpf (ref 0–5)
SQUAMOUS EPITHELIAL / LPF: NONE SEEN
Specific Gravity, Urine: 1.028 (ref 1.005–1.030)
WBC UA: NONE SEEN WBC/hpf (ref 0–5)

## 2017-05-09 LAB — CBC
HEMATOCRIT: 45.5 % (ref 35.0–47.0)
HEMOGLOBIN: 15 g/dL (ref 12.0–16.0)
MCH: 28.3 pg (ref 26.0–34.0)
MCHC: 32.9 g/dL (ref 32.0–36.0)
MCV: 86.2 fL (ref 80.0–100.0)
Platelets: 171 10*3/uL (ref 150–440)
RBC: 5.28 MIL/uL — ABNORMAL HIGH (ref 3.80–5.20)
RDW: 13 % (ref 11.5–14.5)
WBC: 8.7 10*3/uL (ref 3.6–11.0)

## 2017-05-09 LAB — BASIC METABOLIC PANEL
Anion gap: 13 (ref 5–15)
BUN: 20 mg/dL (ref 6–20)
CHLORIDE: 96 mmol/L — AB (ref 101–111)
CO2: 27 mmol/L (ref 22–32)
Calcium: 9.4 mg/dL (ref 8.9–10.3)
Creatinine, Ser: 0.77 mg/dL (ref 0.44–1.00)
GFR calc non Af Amer: >60 mL/min (ref >60)
Glucose, Bld: 529 mg/dL (ref 65–99)
Potassium: 3.6 mmol/L (ref 3.5–5.1)
Sodium: 136 mmol/L (ref 135–145)

## 2017-05-09 LAB — GLUCOSE, CAPILLARY
Glucose-Capillary: 389 mg/dL — ABNORMAL HIGH (ref 65–99)
Glucose-Capillary: 248 mg/dL — ABNORMAL HIGH (ref 65–99)

## 2017-05-09 LAB — TROPONIN I: Troponin I: <0.03 ng/mL (ref <0.03)

## 2017-05-09 MED ORDER — KETOROLAC TROMETHAMINE 30 MG/ML IJ SOLN
30.0000 mg | Freq: Once | INTRAMUSCULAR | Status: AC
  Administered 2017-05-09: 30 mg via INTRAVENOUS
  Filled 2017-05-09: qty 1

## 2017-05-09 MED ORDER — SODIUM CHLORIDE 0.9 % IV BOLUS (SEPSIS)
1000.0000 mL | Freq: Once | INTRAVENOUS | Status: AC
  Administered 2017-05-09: 1000 mL via INTRAVENOUS

## 2017-05-09 MED ORDER — HYDROMORPHONE HCL 1 MG/ML IJ SOLN
0.5000 mg | Freq: Once | INTRAMUSCULAR | Status: AC
  Administered 2017-05-09: 0.5 mg via INTRAVENOUS
  Filled 2017-05-09: qty 1

## 2017-05-09 MED ORDER — INSULIN ASPART 100 UNIT/ML ~~LOC~~ SOLN
10.0000 [IU] | Freq: Once | SUBCUTANEOUS | Status: AC
  Administered 2017-05-09: 10 [IU] via INTRAVENOUS
  Filled 2017-05-09: qty 1

## 2017-05-09 NOTE — ED Notes (Signed)

## 2017-05-09 NOTE — Discharge Instructions (Signed)
Please continue to take your gabapentin, and Advil or Tylenol as needed for the pain. Please take all precautions to prevent falls, including using a walker. Please continue diabetic diet and take your insulin as prescribed; check her blood sugars multiple times a day and bring record with you to your doctor's office.  Return to the emergency department for severe pain, lightheadedness or fainting, fever, or any other symptoms concerning to you.

## 2017-05-09 NOTE — ED Notes (Signed)
Urine recollected and resent to lab.

## 2017-05-09 NOTE — ED Triage Notes (Signed)
Arrives from home via [facility] for c/o fall and dizziness today.  Patient states she lost balance / right leg gave out and she fell today at around 1430.  She hit head on floor.  No LOC.  Also c/o URI symptoms x 1 week, dizziness x 2 days.  CBG by EMS:  542.  Patient also describes many life stressors currently.

## 2017-05-09 NOTE — ED Provider Notes (Addendum)
[facility] Emergency Department Provider Note  ____________________________________________  Time seen: Approximately 4:35 PM  I have reviewed the triage vital signs and the nursing notes.   HISTORY  Chief Complaint Hyperglycemia and Fall    HPI Karen Lindsey is a 51 y.o. 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 The patient reports that she was walking today when she had an acute sharp pain in the mid tibia that was typical for her. This pain was so severe it caused her to be lightheaded, and she fell down, striking the top of her head on a tray on the ground. The left leg pain is unusual for her, but now she is experiencing right knee pain, and some neck pain without numbness tingling or weakness. She did not lose consciousness. She had no syncope, chest pain, shortness of breath or palpitations. She was able to get up on her own. She reports frequent falls since her injury months ago; she does not use a walker at home. She is treated for her pain with 1200 mg of gabapentin 3 times daily, and 600 mg of Advil as needed; she took both of these today without any relief.   Past Medical History:  Diagnosis Date  . Arthritis    rheumatoid  . Diabetes mellitus without complication (HCC)   . Hypertension   . Neuropathy   . RA (rheumatoid arthritis) (HCC)     There are no active problems to display for this patient.   Past Surgical History:  Procedure Laterality Date  . ABDOMINAL HYSTERECTOMY    . CESAREAN SECTION    . CHOLECYSTECTOMY    . KNEE SURGERY    . MANDIBLE FRACTURE SURGERY      Current Outpatient Rx  . Order #: [id number] Class: Print  . Order #: [id number] Class: Print  . Order #: [id number] Class: Print  . Order #: [id number] Class: Print  . Order #: [id number] Class: Historical Med  . Order #: [id number] Class: Historical Med  . Order  #: [id number] Class: Historical Med  . Order #: [id number] Class: Print  . Order #: [id number] Class: Print  . Order #: [id number] Class: Print  . Order #: [id number] Class: Print  . Order #: [id number] Class: Historical Med  . Order #: [id number] Class: Print  . Order #: [id number] Class: Historical Med  . Order #: [id number] Class: Historical Med  . Order #: [id number] Class: Historical Med  . Order #: [id number] Class: Print  . Order #: [id number] Class: Print  . Order #: [id number] Class: Historical Med  . Order #: [id number] Class: Print    Allergies Ultram Marcia Brash hcl]  No family history on file.  Social History Social History  Substance Use Topics  . Smoking status: Current Every Day Smoker    Packs/day: 0.50    Types: Cigarettes  . Smokeless tobacco: Never Used  . Alcohol use No    Review of Systems Constitutional: No fever/chills. Positive lightheadedness. No syncope. Positive fall. Eyes: No visual changes. No blurred or double vision. ENT: No sore throat. No congestion or rhinorrhea. Cardiovascular: Denies chest pain. Denies palpitations. Respiratory: Denies shortness of breath.  No cough. Gastrointestinal: No abdominal pain.  No nausea, no vomiting.  No diarrhea.  No constipation. Genitourinary: Negative for dysuria. Musculoskeletal: Positive for chronic unchanged back pain. Positive for neck pain, right knee pain and chronic mid tibia pain that is unchanged. Skin: Negative for rash. Neurological: Negative for headaches. No focal numbness, tingling or weakness.  ____________________________________________   PHYSICAL EXAM:  VITAL SIGNS: ED Triage Vitals  Enc Vitals Group     BP 05/09/17 1511 135/66     Pulse Rate 05/09/17 1511 76     Resp 05/09/17 1511 16     Temp 05/09/17 1511 99.5 F (37.5 C)     Temp Source 05/09/17 1511 Oral     SpO2 05/09/17 1511 95 %     Weight 05/09/17 1512 144 lb (65.3 kg)     Height 05/09/17 1512 5\' 1"  (1.549 m)     Head Circumference --       Peak Flow --      Pain Score --      Pain Loc --      Pain Edu? --      Excl. in GC? --     Constitutional: Alert and oriented. Chronically ill appearing and uncomfortable and nontoxic. Answers questions appropriately. Eyes: Conjunctivae are normal.  EOMI. No scleral icterus. Head: Atraumatic. Nose: No congestion/rhinnorhea. Mouth/Throat: Mucous membranes are moist.  Neck: No stridor.  Supple.  No JVD. No meningismus. Patient has diffuse nonfocal midline tenderness. Cardiovascular: Normal rate, regular rhythm. No murmurs, rubs or gallops.  Respiratory: Normal respiratory effort.  No accessory muscle use or retractions. Lungs CTAB.  No wheezes, rales or ronchi. Gastrointestinal: Soft, nontender and nondistended.  No guarding or rebound.  No peritoneal signs. Musculoskeletal: Pelvis is stable. Full range of motion of the bilateral shoulders, elbows, wrists, hips, left knee, and ankles without pain. The patient does not have any evidence of effusion in the right knee, but does have significant pain with range of motion. She also has a scar over the mid tibia on the left, with associated tenderness to palpation but no erythema, fluctuance, or warmth. No LE edema. Positive ttp in the calves bilaterally, without palpable cords.   Neurologic:  A&Ox3.  Speech is clear.  Face and smile are symmetric.  EOMI.  Moves all extremities well. Skin:  Skin is warm, dry and intact. No rash noted. Psychiatric: Mood and affect are normal. Speech and behavior are normal.  Normal judgement.  ____________________________________________   LABS (all labs ordered are listed, but only abnormal results are displayed)  Labs Reviewed  CBC - Abnormal; Notable for the following:       Result Value   RBC 5.28 (*)    All other components within normal limits  BASIC METABOLIC PANEL - Abnormal; Notable for the following:    Chloride 96 (*)    Glucose, Bld 529 (*)    All other components within normal limits   TROPONIN I  URINALYSIS, COMPLETE (UACMP) WITH MICROSCOPIC  CBG MONITORING, ED  CBG MONITORING, ED   ____________________________________________  EKG  ED ECG REPORT I, Rockne MenghiniNorman, Anne-Caroline, the attending physician, personally viewed and interpreted this ECG.   Date: 05/09/2017  EKG Time: 1521  Rate: 70  Rhythm: normal sinus rhythm  Axis: normal  Intervals:none  ST&T Change: no STEMI  ____________________________________________  RADIOLOGY  Dg Tibia/fibula Left  Result Date: 05/09/2017 CLINICAL DATA:  Legs gave out and patient fell onto right side. Complains of left leg and right knee pain. EXAM: LEFT TIBIA AND FIBULA - 2 VIEW COMPARISON:  None. FINDINGS: Intact knee and ankle joints without dislocation. No fracture of the left tibia nor fibula. No focal soft tissue abnormality. IMPRESSION: No acute osseous abnormality of the left tibia nor fibula. Electronically Signed   By: Tollie Ethavid  Kwon M.D.   On: 05/09/2017 17:39  Ct Head Wo Contrast  Result Date: 05/09/2017 CLINICAL DATA:  Larey Seat today with dizziness. Imbalance. Denies LOC. No reported neck pain. Rheumatoid arthritis. Hypertension. Diabetes. EXAM: CT HEAD WITHOUT CONTRAST CT CERVICAL SPINE WITHOUT CONTRAST TECHNIQUE: Multidetector CT imaging of the head and cervical spine was performed following the standard protocol without intravenous contrast. Multiplanar CT image reconstructions of the cervical spine were also generated. COMPARISON:  03/15/2012. FINDINGS: CT HEAD FINDINGS Brain: No evidence of acute infarction, hemorrhage, hydrocephalus, extra-axial collection or mass lesion/mass effect. Normal for age cerebral volume. No significant white matter disease. Vascular: Calcification of the cavernous internal carotid arteries consistent with cerebrovascular atherosclerotic disease. No signs of intracranial large vessel occlusion. Skull: Normal. Negative for fracture or focal lesion. Sinuses/Orbits: No acute finding. Other: None. CT  CERVICAL SPINE FINDINGS Alignment: Reversal of the normal cervical lordotic curve. No traumatic subluxation. Skull base and vertebrae: No acute fracture. No primary bone lesion or focal pathologic process. Soft tissues and spinal canal: No prevertebral fluid or swelling. No visible canal hematoma. Carotid bifurcation atherosclerosis. Disc levels: Disc space narrowing at C6-7. Uncinate spurring to the LEFT contributes to foraminal narrowing. Similar LEFT-sided with uncinate spurring at C3-4 on the LEFT, with mild associated facet disease. Upper chest: Negative. Other: None. IMPRESSION: Unremarkable CT head without contrast. No skull fracture or intracranial hemorrhage. Cervical spondylosis worst at C6-7 and C3-4. No cervical spine fracture or traumatic subluxation. Electronically Signed   By: Elsie Stain M.D.   On: 05/09/2017 17:00   Ct Cervical Spine Wo Contrast  Result Date: 05/09/2017 CLINICAL DATA:  Larey Seat today with dizziness. Imbalance. Denies LOC. No reported neck pain. Rheumatoid arthritis. Hypertension. Diabetes. EXAM: CT HEAD WITHOUT CONTRAST CT CERVICAL SPINE WITHOUT CONTRAST TECHNIQUE: Multidetector CT imaging of the head and cervical spine was performed following the standard protocol without intravenous contrast. Multiplanar CT image reconstructions of the cervical spine were also generated. COMPARISON:  03/15/2012. FINDINGS: CT HEAD FINDINGS Brain: No evidence of acute infarction, hemorrhage, hydrocephalus, extra-axial collection or mass lesion/mass effect. Normal for age cerebral volume. No significant white matter disease. Vascular: Calcification of the cavernous internal carotid arteries consistent with cerebrovascular atherosclerotic disease. No signs of intracranial large vessel occlusion. Skull: Normal. Negative for fracture or focal lesion. Sinuses/Orbits: No acute finding. Other: None. CT CERVICAL SPINE FINDINGS Alignment: Reversal of the normal cervical lordotic curve. No traumatic  subluxation. Skull base and vertebrae: No acute fracture. No primary bone lesion or focal pathologic process. Soft tissues and spinal canal: No prevertebral fluid or swelling. No visible canal hematoma. Carotid bifurcation atherosclerosis. Disc levels: Disc space narrowing at C6-7. Uncinate spurring to the LEFT contributes to foraminal narrowing. Similar LEFT-sided with uncinate spurring at C3-4 on the LEFT, with mild associated facet disease. Upper chest: Negative. Other: None. IMPRESSION: Unremarkable CT head without contrast. No skull fracture or intracranial hemorrhage. Cervical spondylosis worst at C6-7 and C3-4. No cervical spine fracture or traumatic subluxation. Electronically Signed   By: Elsie Stain M.D.   On: 05/09/2017 17:00   Dg Knee Complete 4 Views Right  Result Date: 05/09/2017 CLINICAL DATA:  Right knee pain after fall today. EXAM: RIGHT KNEE - COMPLETE 4+ VIEW COMPARISON:  11/02/2016 FINDINGS: No evidence of acute fracture joint dislocation. Trace suprapatellar joint effusion. Slight degenerate femorotibial joint space narrowing with spurring of the tibial plateau. Status post ACL repair. Soft tissues are unremarkable. IMPRESSION: No evidence of acute fracture nor dislocation. Trace joint effusion. Electronically Signed   By: Tollie Eth M.D.   On: 05/09/2017  17:41    ____________________________________________   PROCEDURES  Procedure(s) performed: None  Procedures  Critical Care performed: No ____________________________________________   INITIAL IMPRESSION / ASSESSMENT AND PLAN / ED COURSE  Pertinent labs & imaging results that were available during my care of the patient were reviewed by me and considered in my medical decision making (see chart for details).  51 y.o. female w/ hx of recurrent falls, diabetic neuropathy, chronic pain presenting w/ fall where she does not lose consciousness but she did hit her head, with resulting right knee pain. Overall, I'm a  trauma standpoint, we'll get a CT head, and C-spine, right knee x-ray, and left tibia x-ray to rule out acute injury. The patient did have some associated lightheadedness and her EKG does not show any evidence of arrhythmia, her vital signs are stable. We will also get orthostatics although the patient does not appear clinically dehydrated. We'll do basic blood work including troponin. I will initiate symptomatic treatment. It is likely that the patient has recurrent falls from her pain, and if her workup in the emergency department is reassuring, I'll plan to discharge her home with instructions to use a walker to prevent further falls. Plan reevaluation for final disposition.  ----------------------------------------- 6:26 PM on 05/09/2017 -----------------------------------------  The patient's traumatic workup has been reassuring. Her CT head and C-spine did not show any acute injury. Her knee and ankle x-rays also were without fracture. I'm awaiting the results of the patient's ultrasound to evaluate for DVT. Her laboratory studies are consistent with hyperglycemia without DKA, which I have treated with intravenous fluids and insulin. The patient's lightheadedness workup has been reassuring without any evidence of arrhythmia, ACS; her troponin is negative. We will get a urinalysis; if this is reassuring, the patient we discharge home.  ----------------------------------------- 7:19 PM on 05/09/2017 -----------------------------------------  No evidence of DVT on ultrasound. No UTI. ____________________________________________  FINAL CLINICAL IMPRESSION(S) / ED DIAGNOSES  Final diagnoses:  Hyperglycemia  Fall, initial encounter  Acute pain of right knee  Strain of neck muscle, initial encounter  Left leg pain         NEW MEDICATIONS STARTED DURING THIS VISIT:  New Prescriptions   No medications on file      Rockne Menghini, MD 05/09/17 Silva Bandy    Rockne Menghini,  MD 05/09/17 1919    Rockne Menghini, MD 05/09/17 Barry Brunner

## 2017-05-24 ENCOUNTER — Encounter: Payer: Self-pay | Admitting: Intensive Care

## 2017-05-24 ENCOUNTER — Emergency Department
Admission: EM | Admit: 2017-05-24 | Discharge: 2017-05-24 | Disposition: A | Discharge status: Home / Self Care | Payer: Self-pay | Attending: Emergency Medicine | Admitting: Emergency Medicine

## 2017-05-24 DIAGNOSIS — R531 Weakness: Secondary | ICD-10-CM | POA: Insufficient documentation

## 2017-05-24 DIAGNOSIS — Z79899 Other long term (current) drug therapy: Secondary | ICD-10-CM | POA: Insufficient documentation

## 2017-05-24 DIAGNOSIS — J029 Acute pharyngitis, unspecified: Secondary | ICD-10-CM | POA: Insufficient documentation

## 2017-05-24 DIAGNOSIS — M069 Rheumatoid arthritis, unspecified: Secondary | ICD-10-CM | POA: Insufficient documentation

## 2017-05-24 DIAGNOSIS — I10 Essential (primary) hypertension: Secondary | ICD-10-CM | POA: Insufficient documentation

## 2017-05-24 DIAGNOSIS — Z794 Long term (current) use of insulin: Secondary | ICD-10-CM | POA: Insufficient documentation

## 2017-05-24 DIAGNOSIS — E1140 Type 2 diabetes mellitus with diabetic neuropathy, unspecified: Secondary | ICD-10-CM | POA: Insufficient documentation

## 2017-05-24 DIAGNOSIS — F17210 Nicotine dependence, cigarettes, uncomplicated: Secondary | ICD-10-CM | POA: Insufficient documentation

## 2017-05-24 LAB — BASIC METABOLIC PANEL
Anion gap: 12 (ref 5–15)
BUN: 28 mg/dL — ABNORMAL HIGH (ref 6–20)
CALCIUM: 9.5 mg/dL (ref 8.9–10.3)
CO2: 29 mmol/L (ref 22–32)
Chloride: 95 mmol/L — ABNORMAL LOW (ref 101–111)
Creatinine, Ser: 0.98 mg/dL (ref 0.44–1.00)
GFR calc Af Amer: >60 mL/min (ref >60)
GFR calc non Af Amer: >60 mL/min (ref >60)
GLUCOSE: 314 mg/dL — AB (ref 65–99)
Potassium: 3.5 mmol/L (ref 3.5–5.1)
Sodium: 136 mmol/L (ref 135–145)

## 2017-05-24 LAB — URINALYSIS, COMPLETE (UACMP) WITH MICROSCOPIC
BACTERIA UA: NONE SEEN
Bilirubin Urine: NEGATIVE
Glucose, UA: >=500 mg/dL — AB
Hgb urine dipstick: NEGATIVE
Ketones, ur: NEGATIVE mg/dL
Leukocytes, UA: NEGATIVE
Nitrite: NEGATIVE
Protein, ur: NEGATIVE mg/dL
Specific Gravity, Urine: 1.02 (ref 1.005–1.030)
pH: 5 (ref 5.0–8.0)

## 2017-05-24 LAB — CBC
HCT: 43.4 % (ref 35.0–47.0)
HEMOGLOBIN: 14.2 g/dL (ref 12.0–16.0)
MCH: 28 pg (ref 26.0–34.0)
MCHC: 32.7 g/dL (ref 32.0–36.0)
MCV: 85.5 fL (ref 80.0–100.0)
Platelets: 187 10*3/uL (ref 150–440)
RBC: 5.08 MIL/uL (ref 3.80–5.20)
RDW: 13.1 % (ref 11.5–14.5)
WBC: 9.5 10*3/uL (ref 3.6–11.0)

## 2017-05-24 LAB — GLUCOSE, CAPILLARY: GLUCOSE-CAPILLARY: 302 mg/dL — AB (ref 65–99)

## 2017-05-24 MED ORDER — IBUPROFEN 600 MG PO TABS
600.0000 mg | ORAL_TABLET | Freq: Once | ORAL | Status: AC
  Administered 2017-05-24: 600 mg via ORAL
  Filled 2017-05-24: qty 1

## 2017-05-24 MED ORDER — DEXAMETHASONE SODIUM PHOSPHATE 10 MG/ML IJ SOLN
INTRAMUSCULAR | Status: AC
  Filled 2017-05-24: qty 1

## 2017-05-24 MED ORDER — DEXAMETHASONE 10 MG/ML FOR PEDIATRIC ORAL USE: 8.0000 mg | Freq: Once | INTRAMUSCULAR | Status: DC

## 2017-05-24 MED ORDER — ACETAMINOPHEN 500 MG PO TABS
1000.0000 mg | ORAL_TABLET | Freq: Once | ORAL | Status: AC
  Administered 2017-05-24: 1000 mg via ORAL
  Filled 2017-05-24: qty 2

## 2017-05-24 MED ORDER — DEXAMETHASONE 4 MG PO TABS
8.0000 mg | ORAL_TABLET | Freq: Once | ORAL | Status: AC
  Administered 2017-05-24: 8 mg via ORAL
  Filled 2017-05-24: qty 2

## 2017-05-24 MED ORDER — IBUPROFEN 600 MG PO TABS: 600.0000 mg | ORAL_TABLET | Freq: Three times a day (TID) | ORAL | 0 refills | Status: DC | PRN

## 2017-05-24 NOTE — Discharge Instructions (Signed)
It is normal for you to be sick for a full 7 days with this illness.  Please make sure you remain well-hydrated and use ibuprofen as needed for fevers and chills.  Follow-up with your primary care physician as needed.  Return to the emergency department for any concerns whatsoever.  It was a pleasure to take care of you today, and thank you for coming to our emergency department.  If you have any questions or concerns before leaving please ask the nurse to grab me and I'm more than happy to go through your aftercare instructions again.  If you were prescribed any opioid pain medication today such as Norco, Vicodin, Percocet, morphine, hydrocodone, or oxycodone please make sure you do not drive when you are taking this medication as it can alter your ability to drive safely.  If you have any concerns once you are home that you are not improving or are in fact getting worse before you can make it to your follow-up appointment, please do not hesitate to call 911 and come back for further evaluation.  Merrily Brittle, MD  Results for orders placed or performed during the hospital encounter of 05/24/17  Basic metabolic panel  Result Value Ref Range   Sodium 136 135 - 145 mmol/L   Potassium 3.5 3.5 - 5.1 mmol/L   Chloride 95 (L) 101 - 111 mmol/L   CO2 29 22 - 32 mmol/L   Glucose, Bld 314 (H) 65 - 99 mg/dL   BUN 28 (H) 6 - 20 mg/dL   Creatinine, Ser 5.53 0.44 - 1.00 mg/dL   Calcium 9.5 8.9 - 74.8 mg/dL   GFR calc non Af Amer >60 >60 mL/min   GFR calc Af Amer >60 >60 mL/min   Anion gap 12 5 - 15  CBC  Result Value Ref Range   WBC 9.5 3.6 - 11.0 K/uL   RBC 5.08 3.80 - 5.20 MIL/uL   Hemoglobin 14.2 12.0 - 16.0 g/dL   HCT 27.0 78.6 - 75.4 %   MCV 85.5 80.0 - 100.0 fL   MCH 28.0 26.0 - 34.0 pg   MCHC 32.7 32.0 - 36.0 g/dL   RDW 49.2 01.0 - 07.1 %   Platelets 187 150 - 440 K/uL  Urinalysis, Complete w Microscopic  Result Value Ref Range   Color, Urine YELLOW (A) YELLOW   APPearance HAZY (A)  CLEAR   Specific Gravity, Urine 1.020 1.005 - 1.030   pH 5.0 5.0 - 8.0   Glucose, UA >=500 (A) NEGATIVE mg/dL   Hgb urine dipstick NEGATIVE NEGATIVE   Bilirubin Urine NEGATIVE NEGATIVE   Ketones, ur NEGATIVE NEGATIVE mg/dL   Protein, ur NEGATIVE NEGATIVE mg/dL   Nitrite NEGATIVE NEGATIVE   Leukocytes, UA NEGATIVE NEGATIVE   RBC / HPF 0-5 0 - 5 RBC/hpf   WBC, UA 0-5 0 - 5 WBC/hpf   Bacteria, UA NONE SEEN NONE SEEN   Squamous Epithelial / LPF 0-5 (A) NONE SEEN  Glucose, capillary  Result Value Ref Range   Glucose-Capillary 302 (H) 65 - 99 mg/dL   Dg Tibia/fibula Left  Result Date: 05/09/2017 CLINICAL DATA:  Legs gave out and patient fell onto right side. Complains of left leg and right knee pain. EXAM: LEFT TIBIA AND FIBULA - 2 VIEW COMPARISON:  None. FINDINGS: Intact knee and ankle joints without dislocation. No fracture of the left tibia nor fibula. No focal soft tissue abnormality. IMPRESSION: No acute osseous abnormality of the left tibia nor fibula. Electronically Signed  By: Tollie Eth M.D.   On: 05/09/2017 17:39   Ct Head Wo Contrast  Result Date: 05/09/2017 CLINICAL DATA:  Larey Seat today with dizziness. Imbalance. Denies LOC. No reported neck pain. Rheumatoid arthritis. Hypertension. Diabetes. EXAM: CT HEAD WITHOUT CONTRAST CT CERVICAL SPINE WITHOUT CONTRAST TECHNIQUE: Multidetector CT imaging of the head and cervical spine was performed following the standard protocol without intravenous contrast. Multiplanar CT image reconstructions of the cervical spine were also generated. COMPARISON:  03/15/2012. FINDINGS: CT HEAD FINDINGS Brain: No evidence of acute infarction, hemorrhage, hydrocephalus, extra-axial collection or mass lesion/mass effect. Normal for age cerebral volume. No significant white matter disease. Vascular: Calcification of the cavernous internal carotid arteries consistent with cerebrovascular atherosclerotic disease. No signs of intracranial large vessel occlusion.  Skull: Normal. Negative for fracture or focal lesion. Sinuses/Orbits: No acute finding. Other: None. CT CERVICAL SPINE FINDINGS Alignment: Reversal of the normal cervical lordotic curve. No traumatic subluxation. Skull base and vertebrae: No acute fracture. No primary bone lesion or focal pathologic process. Soft tissues and spinal canal: No prevertebral fluid or swelling. No visible canal hematoma. Carotid bifurcation atherosclerosis. Disc levels: Disc space narrowing at C6-7. Uncinate spurring to the LEFT contributes to foraminal narrowing. Similar LEFT-sided with uncinate spurring at C3-4 on the LEFT, with mild associated facet disease. Upper chest: Negative. Other: None. IMPRESSION: Unremarkable CT head without contrast. No skull fracture or intracranial hemorrhage. Cervical spondylosis worst at C6-7 and C3-4. No cervical spine fracture or traumatic subluxation. Electronically Signed   By: Elsie Stain M.D.   On: 05/09/2017 17:00   Ct Cervical Spine Wo Contrast  Result Date: 05/09/2017 CLINICAL DATA:  Larey Seat today with dizziness. Imbalance. Denies LOC. No reported neck pain. Rheumatoid arthritis. Hypertension. Diabetes. EXAM: CT HEAD WITHOUT CONTRAST CT CERVICAL SPINE WITHOUT CONTRAST TECHNIQUE: Multidetector CT imaging of the head and cervical spine was performed following the standard protocol without intravenous contrast. Multiplanar CT image reconstructions of the cervical spine were also generated. COMPARISON:  03/15/2012. FINDINGS: CT HEAD FINDINGS Brain: No evidence of acute infarction, hemorrhage, hydrocephalus, extra-axial collection or mass lesion/mass effect. Normal for age cerebral volume. No significant white matter disease. Vascular: Calcification of the cavernous internal carotid arteries consistent with cerebrovascular atherosclerotic disease. No signs of intracranial large vessel occlusion. Skull: Normal. Negative for fracture or focal lesion. Sinuses/Orbits: No acute finding. Other: None.  CT CERVICAL SPINE FINDINGS Alignment: Reversal of the normal cervical lordotic curve. No traumatic subluxation. Skull base and vertebrae: No acute fracture. No primary bone lesion or focal pathologic process. Soft tissues and spinal canal: No prevertebral fluid or swelling. No visible canal hematoma. Carotid bifurcation atherosclerosis. Disc levels: Disc space narrowing at C6-7. Uncinate spurring to the LEFT contributes to foraminal narrowing. Similar LEFT-sided with uncinate spurring at C3-4 on the LEFT, with mild associated facet disease. Upper chest: Negative. Other: None. IMPRESSION: Unremarkable CT head without contrast. No skull fracture or intracranial hemorrhage. Cervical spondylosis worst at C6-7 and C3-4. No cervical spine fracture or traumatic subluxation. Electronically Signed   By: Elsie Stain M.D.   On: 05/09/2017 17:00   US Venous Img Lower Unilateral Left  Result Date: 05/09/2017 CLINICAL DATA:  LEFT leg pain since May.  Fell earlier today. EXAM: LEFT LOWER EXTREMITY VENOUS DOPPLER ULTRASOUND TECHNIQUE: Gray-scale sonography with graded compression, as well as color Doppler and duplex ultrasound were performed to evaluate the lower extremity deep venous systems from the level of the common femoral vein and including the common femoral, femoral, profunda femoral, popliteal and calf veins  including the posterior tibial, peroneal and gastrocnemius veins when visible. The superficial great saphenous vein was also interrogated. Spectral Doppler was utilized to evaluate flow at rest and with distal augmentation maneuvers in the common femoral, femoral and popliteal veins. COMPARISON:  12/24/2016. FINDINGS: Contralateral Common Femoral Vein: Respiratory phasicity is normal and symmetric with the symptomatic side. No evidence of thrombus. Normal compressibility. Common Femoral Vein: No evidence of thrombus. Normal compressibility, respiratory phasicity and response to augmentation. Saphenofemoral  Junction: No evidence of thrombus. Normal compressibility and flow on color Doppler imaging. Profunda Femoral Vein: No evidence of thrombus. Normal compressibility and flow on color Doppler imaging. Femoral Vein: No evidence of thrombus. Normal compressibility, respiratory phasicity and response to augmentation. Popliteal Vein: No evidence of thrombus. Normal compressibility, respiratory phasicity and response to augmentation. Calf Veins: No evidence of thrombus. Normal compressibility and flow on color Doppler imaging. Superficial Great Saphenous Vein: No evidence of thrombus. Normal compressibility. Venous Reflux:  None. Other Findings:  None. IMPRESSION: No evidence of LEFT lower extremity deep venous thrombosis. No change from priors. Electronically Signed   By: Elsie Stain M.D.   On: 05/09/2017 18:42   Dg Knee Complete 4 Views Right  Result Date: 05/09/2017 CLINICAL DATA:  Right knee pain after fall today. EXAM: RIGHT KNEE - COMPLETE 4+ VIEW COMPARISON:  11/02/2016 FINDINGS: No evidence of acute fracture joint dislocation. Trace suprapatellar joint effusion. Slight degenerate femorotibial joint space narrowing with spurring of the tibial plateau. Status post ACL repair. Soft tissues are unremarkable. IMPRESSION: No evidence of acute fracture nor dislocation. Trace joint effusion. Electronically Signed   By: Tollie Eth M.D.   On: 05/09/2017 17:41

## 2017-05-24 NOTE — ED Provider Notes (Signed)
[facility] Emergency Department Provider Note  ____________________________________________   First MD Initiated Contact with Patient 05/24/17 1322     (approximate)  I have reviewed the triage vital signs and the nursing notes.   HISTORY  Chief Complaint Neck Pain and Weakness   HPI Karen Lindsey is a 51 y.o. female who comes to the emergency department with several days of low-grade fever, malaise, or throat, and dry cough.  She was at work today and began to hyperventilate and felt numbness and tingling in her fingers.  She is a past medical history of diabetes mellitus and is concerned because she cannot afford food and her food stamps do not kick in until tomorrow.  Her symptoms have had insidious onset has been slowly progressive.  They are worsened by not eating and improved when she does have access to food.  Past Medical History:  Diagnosis Date  . Arthritis    rheumatoid  . Diabetes mellitus without complication (HCC)   . Hypertension   . Neuropathy   . RA (rheumatoid arthritis) (HCC)     There are no active problems to display for this patient.   Past Surgical History:  Procedure Laterality Date  . ABDOMINAL HYSTERECTOMY    . CESAREAN SECTION    . CHOLECYSTECTOMY    . KNEE SURGERY    . MANDIBLE FRACTURE SURGERY      Prior to Admission medications   Medication Sig Start Date End Date Taking? Authorizing Provider  baclofen (LIORESAL) 10 MG tablet Take 1 tablet (10 mg total) by mouth 3 (three) times daily. Patient not taking: Reported on 05/09/2017 10/20/15   Kem Boroughs B, FNP  benzonatate (TESSALON) 200 MG capsule Take 1 capsule (200 mg total) by mouth 3 (three) times daily as needed for cough. Patient not taking: Reported on 05/09/2017 04/28/15   [name], MD  Calcium Carb-Ergocalciferol 500-200 MG-UNIT TABS Take 1 tablet by mouth daily.    [provider]  canagliflozin (INVOKANA) 100 MG TABS tablet Take 100 mg  by mouth daily. 02/24/17   [provider]  cyclobenzaprine (FLEXERIL) 5 MG tablet Take 1 tablet (5 mg total) by mouth 3 (three) times daily as needed for muscle spasms. Patient not taking: Reported on 05/09/2017 11/02/16   Menshew, Charlesetta Ivory, PA-C  diclofenac (VOLTAREN) 50 MG EC tablet Take 1 tablet (50 mg total) by mouth 2 (two) times daily. Patient not taking: Reported on 05/09/2017 11/02/16   Menshew, Charlesetta Ivory, PA-C  DOCOSAHEXAENOIC ACID PO Take 1 capsule by mouth daily.    [provider]  gabapentin (NEURONTIN) 300 MG capsule Take 1,200 mg by mouth 3 (three) times daily.     [provider]  glucosamine-chondroitin 500-400 MG tablet Take 1 tablet by mouth daily.    [provider]  ibuprofen (ADVIL,MOTRIN) 200 MG tablet Take 200 mg by mouth every 6 (six) hours as needed.    [provider]  ibuprofen (ADVIL,MOTRIN) 600 MG tablet Take 1 tablet (600 mg total) every 8 (eight) hours as needed by mouth. 05/24/17   Merrily Brittle, MD  insulin NPH Human (HUMULIN N) 100 UNIT/ML injection Inject 0.4 mLs (40 Units total) into the skin at bedtime. Patient not taking: Reported on 05/09/2017 05/02/16   [name], MD  insulin NPH-regular Human (NOVOLIN 70/30) (70-30) 100 UNIT/ML injection 20-30 units [place] before breakfast and dinner  Check glucose prior to usage Patient taking differently: Inject 32 Units into the skin  2 (two) times daily with a meal.  09/13/16   Schaevitz, Myra Rude, MD  ketorolac (TORADOL) 10 MG tablet Take 1 tablet (10 mg total) by mouth every 8 (eight) hours as needed. Patient not taking: Reported on 05/09/2017 05/12/15   [name], MD  lisinopril-hydrochlorothiazide (PRINZIDE,ZESTORETIC) 10-12.5 MG tablet Take 1 tablet by mouth daily.    [provider]  LORazepam (ATIVAN) 0.5 MG tablet Take 1 tablet (0.5 mg total) by mouth every 8 (eight) hours as needed for anxiety. Patient not taking: Reported on  05/09/2017 03/12/15   Darien Ramus, MD  meloxicam (MOBIC) 15 MG tablet Take 1 tablet (15 mg total) by mouth daily. Patient not taking: Reported on 05/09/2017 10/20/15   Kem Boroughs B, FNP  metFORMIN (GLUCOPHAGE) 500 MG tablet Take 500 mg by mouth every evening.     [provider]  methadone (DOLOPHINE) 5 MG/5ML solution Take 120 mg by mouth daily.     [provider]  Multiple Vitamin (MULTIVITAMIN WITH MINERALS) TABS tablet Take 1 tablet by mouth daily.    [provider]  ondansetron (ZOFRAN) 4 MG tablet Take 1 tablet (4 mg total) by mouth every 8 (eight) hours as needed for nausea or vomiting. Patient not taking: Reported on 05/09/2017 10/27/15   Phineas Semen, MD  oxyCODONE-acetaminophen (PERCOCET) 7.5-325 MG tablet Take 1 tablet by mouth every 6 (six) hours as needed for severe pain. Patient not taking: Reported on 05/09/2017 12/10/16   Joni Reining, PA-C  promethazine (PHENERGAN) 12.5 MG tablet Take 1 tablet (12.5 mg total) by mouth every 6 (six) hours as needed for nausea or vomiting. Patient not taking: Reported on 05/09/2017 02/15/16   Willy Eddy, MD  vitamin E 400 UNIT capsule Take 400 Units by mouth daily.    [provider]    Allergies Ultram [tramadol hcl]  History reviewed. No pertinent family history.  Social History Social History   Tobacco Use  . Smoking status: Current Every Day Smoker    Packs/day: 0.50    Types: Cigarettes  . Smokeless tobacco: Never Used  Substance Use Topics  . Alcohol use: No  . Drug use: No    Review of Systems Constitutional: No fever/chills Eyes: No visual changes. ENT: Positive for sore throat. Cardiovascular: Denies chest pain. Respiratory: Positive for shortness of breath. Gastrointestinal: No abdominal pain.  No nausea, no vomiting.  No diarrhea.  No constipation. Genitourinary: Negative for dysuria. Musculoskeletal: Negative for back pain. Skin: Negative for  rash. Neurological: Negative for headaches, focal weakness or numbness.   ____________________________________________   PHYSICAL EXAM:  VITAL SIGNS: ED Triage Vitals  Enc Vitals Group     BP 05/24/17 1151 (!) 110/57     Pulse Rate 05/24/17 1151 (!) 58     Resp 05/24/17 1151 16     Temp 05/24/17 1151 97.9 F (36.6 C)     Temp Source 05/24/17 1151 Oral     SpO2 05/24/17 1151 100 %     Weight 05/24/17 1152 144 lb (65.3 kg)     Height 05/24/17 1152 5\' 1"  (1.549 m)     Head Circumference --      Peak Flow --      Pain Score 05/24/17 1151 5     Pain Loc --      Pain Edu? --      Excl. in GC? --     Constitutional: Alert and oriented x4 tearful nontoxic no diaphoresis speaks in full clear sentences  Eyes: PERRL EOMI. Head: Atraumatic. Nose: No congestion/rhinnorhea. Mouth/Throat: No trismus bilateral exudative pharyngitis Neck: No stridor.  Anterior lymphadenopathy Cardiovascular: Bradycardic rate, regular rhythm. Grossly normal heart sounds.  Good peripheral circulation. Respiratory: Normal respiratory effort.  No retractions. Lungs CTAB and moving good air Gastrointestinal: Soft nontender Musculoskeletal: No lower extremity edema   Neurologic:  Normal speech and language. No gross focal neurologic deficits are appreciated. Skin:  Skin is warm, dry and intact. No rash noted. Psychiatric: Mood and affect are normal. Speech and behavior are normal.    ____________________________________________   DIFFERENTIAL includes but not limited to  Pharyngitis, influenza, influenza-like illness, bronchitis, diabetic ketoacidosis ____________________________________________   LABS (all labs ordered are listed, but only abnormal results are displayed)  Labs Reviewed  BASIC METABOLIC PANEL - Abnormal; Notable for the following components:      Result Value   Chloride 95 (*)    Glucose, Bld 314 (*)    BUN 28 (*)    All other components within normal limits  URINALYSIS,  COMPLETE (UACMP) WITH MICROSCOPIC - Abnormal; Notable for the following components:   Color, Urine YELLOW (*)    APPearance HAZY (*)    Glucose, UA >=500 (*)    Squamous Epithelial / LPF 0-5 (*)    All other components within normal limits  GLUCOSE, CAPILLARY - Abnormal; Notable for the following components:   Glucose-Capillary 302 (*)    All other components within normal limits  CBC  CBG MONITORING, ED    Blood work reviewed and interpreted by me shows elevated sugar but no signs of diabetic ketoacidosis __________________________________________  EKG   ____________________________________________  RADIOLOGY   ____________________________________________   PROCEDURES  Procedure(s) performed: no  Procedures  Critical Care performed: no  Observation: no ____________________________________________   INITIAL IMPRESSION / ASSESSMENT AND PLAN / ED COURSE  Pertinent labs & imaging results that were available during my care of the patient were reviewed by me and considered in my medical decision making (see chart for details).  The patient arrives tearful but overall well-appearing.  She has clear viral pharyngitis.  I will treat her symptomatically with nonsteroidals as well as Decadron.  I had a lengthy discussion with the patient regarding her symptoms and that the tingling in her hands is likely secondary to hyperventilation.  She is given a meal here in the emergency department and feels significantly improved.  At this point I will refer her back to her primary care physician and the patient verbalizes understanding and agreement the plan.  Strict return precautions have been given.      ____________________________________________   FINAL CLINICAL IMPRESSION(S) / ED DIAGNOSES  Final diagnoses:  Pharyngitis, unspecified etiology      NEW MEDICATIONS STARTED DURING THIS VISIT:  This SmartLink is deprecated. Use AVSMEDLIST instead to display the medication  list for a patient.   Note:  This document was prepared using Dragon voice recognition software and may include unintentional dictation errors.     Merrily Brittle, MD 05/24/17 1504

## 2017-05-24 NOTE — ED Triage Notes (Addendum)
Patient reports she was at work and her hands were "jumping" and her legs felt very weak. Also c/o back and neck pain. Patient crying in triage reporting she has ran out of food stamps and has not been able to eat since yesterday and believes that's why she is having these symptoms.  Denies injury to neck. Denies falling or LOC. HX diabetes No distress in triage. Denies SOB or chest pain

## 2017-05-24 NOTE — ED Notes (Signed)
Patient ambulatory to restroom. Drinking water at this time with no complaints of nausea.

## 2017-06-24 ENCOUNTER — Emergency Department
Admission: EM | Admit: 2017-06-24 | Discharge: 2017-06-24 | Disposition: A | Discharge status: Home / Self Care | Payer: Self-pay | Attending: Student in an Organized Health Care Education/Training Program | Admitting: Student in an Organized Health Care Education/Training Program

## 2017-06-24 ENCOUNTER — Emergency Department: Payer: Self-pay

## 2017-06-24 DIAGNOSIS — F17210 Nicotine dependence, cigarettes, uncomplicated: Secondary | ICD-10-CM | POA: Insufficient documentation

## 2017-06-24 DIAGNOSIS — E119 Type 2 diabetes mellitus without complications: Secondary | ICD-10-CM | POA: Insufficient documentation

## 2017-06-24 DIAGNOSIS — R42 Dizziness and giddiness: Secondary | ICD-10-CM | POA: Insufficient documentation

## 2017-06-24 DIAGNOSIS — I10 Essential (primary) hypertension: Secondary | ICD-10-CM | POA: Insufficient documentation

## 2017-06-24 DIAGNOSIS — M069 Rheumatoid arthritis, unspecified: Secondary | ICD-10-CM | POA: Insufficient documentation

## 2017-06-24 DIAGNOSIS — Z794 Long term (current) use of insulin: Secondary | ICD-10-CM | POA: Insufficient documentation

## 2017-06-24 DIAGNOSIS — E860 Dehydration: Secondary | ICD-10-CM | POA: Insufficient documentation

## 2017-06-24 LAB — CBC WITH DIFFERENTIAL/PLATELET
BASOS ABS: 0 10*3/uL (ref 0–0.1)
Basophils Relative: 1 %
EOS ABS: 0.3 10*3/uL (ref 0–0.7)
Eosinophils Relative: 3 %
HCT: 43.1 % (ref 35.0–47.0)
HEMOGLOBIN: 14.7 g/dL (ref 12.0–16.0)
LYMPHS ABS: 2.9 10*3/uL (ref 1.0–3.6)
Lymphocytes Relative: 32 %
MCH: 28.8 pg (ref 26.0–34.0)
MCHC: 34.1 g/dL (ref 32.0–36.0)
MCV: 84.4 fL (ref 80.0–100.0)
Monocytes Absolute: 0.6 10*3/uL (ref 0.2–0.9)
Monocytes Relative: 7 %
NEUTROS PCT: 57 %
Neutro Abs: 5.4 10*3/uL (ref 1.4–6.5)
PLATELETS: 168 10*3/uL (ref 150–440)
RBC: 5.1 MIL/uL (ref 3.80–5.20)
RDW: 13 % (ref 11.5–14.5)
WBC: 9.3 10*3/uL (ref 3.6–11.0)

## 2017-06-24 LAB — URINALYSIS, COMPLETE (UACMP) WITH MICROSCOPIC
BACTERIA UA: NONE SEEN
BILIRUBIN URINE: NEGATIVE
Glucose, UA: >=500 mg/dL — AB
Hgb urine dipstick: NEGATIVE
Ketones, ur: NEGATIVE mg/dL
Nitrite: NEGATIVE
PROTEIN: NEGATIVE mg/dL
SPECIFIC GRAVITY, URINE: 1.035 — AB (ref 1.005–1.030)
pH: 5 (ref 5.0–8.0)

## 2017-06-24 LAB — COMPREHENSIVE METABOLIC PANEL
ALBUMIN: 3.8 g/dL (ref 3.5–5.0)
ALK PHOS: 64 U/L (ref 38–126)
ALT: 22 U/L (ref 14–54)
AST: 27 U/L (ref 15–41)
Anion gap: 10 (ref 5–15)
BUN: 21 mg/dL — AB (ref 6–20)
CALCIUM: 9.6 mg/dL (ref 8.9–10.3)
CHLORIDE: 100 mmol/L — AB (ref 101–111)
CO2: 27 mmol/L (ref 22–32)
CREATININE: 0.7 mg/dL (ref 0.44–1.00)
GFR calc non Af Amer: >60 mL/min (ref >60)
GLUCOSE: 268 mg/dL — AB (ref 65–99)
Potassium: 3.9 mmol/L (ref 3.5–5.1)
SODIUM: 137 mmol/L (ref 135–145)
Total Bilirubin: 0.6 mg/dL (ref 0.3–1.2)
Total Protein: 6.8 g/dL (ref 6.5–8.1)

## 2017-06-24 LAB — TROPONIN I: Troponin I: <0.03 ng/mL (ref <0.03)

## 2017-06-24 MED ORDER — ACETAMINOPHEN 500 MG PO TABS
1000.0000 mg | ORAL_TABLET | Freq: Once | ORAL | Status: AC
  Administered 2017-06-24: 1000 mg via ORAL
  Filled 2017-06-24: qty 2

## 2017-06-24 MED ORDER — SODIUM CHLORIDE 0.9 % IV BOLUS (SEPSIS)
1000.0000 mL | Freq: Once | INTRAVENOUS | Status: AC
  Administered 2017-06-24: 1000 mL via INTRAVENOUS

## 2017-06-24 MED ORDER — ONDANSETRON HCL 4 MG PO TABS
4.0000 mg | ORAL_TABLET | Freq: Once | ORAL | Status: AC
  Administered 2017-06-24: 4 mg via ORAL
  Filled 2017-06-24: qty 1

## 2017-06-24 MED ORDER — NITROFURANTOIN MONOHYD MACRO 100 MG PO CAPS: 100.0000 mg | ORAL_CAPSULE | Freq: Two times a day (BID) | ORAL | 0 refills | Status: AC

## 2017-06-24 MED ORDER — ONDANSETRON HCL 4 MG PO TABS: 4.0000 mg | ORAL_TABLET | Freq: Every day | ORAL | 0 refills | Status: DC | PRN

## 2017-06-24 NOTE — ED Triage Notes (Signed)
Roxan Hockey MD at beside.

## 2017-06-24 NOTE — Discharge Instructions (Signed)
Follow up with PCP.  Return for worsening symptoms, fevers, shortness of breath or for any other concerns.

## 2017-06-24 NOTE — ED Provider Notes (Signed)
[facility] Emergency Department Provider Note    First MD Initiated Contact with Patient 06/24/17 [phone]     (approximate)  I have reviewed the triage vital signs and the nursing notes.   HISTORY  Chief Complaint Dizziness    HPI Karen Lindsey is a 51 y.o. female recent hospitalization presents to the ER with multiple complaints but primarily of which is been ongoing dizziness and yellow vision for the past 31months.  States that patient was walking up the steps to go to her bedroom and started feeling dizzy.  No chest pain or shortness of breath.  She felt like she was about to pass out so she grabbed the handrail.  States that she did fall hit her left shoulder and head.  States she has a mild headache.  Has had similar headaches in the past.  She is not on any blood thinners.  Denies any numbness or tingling.  Does have mild left shoulder pain but able to move shoulder.  Denies any nausea or vomiting.  States she has had decreased oral intake over the past several days and may be dehydrated.  Past Medical History:  Diagnosis Date  . Arthritis    rheumatoid  . Diabetes mellitus without complication (HCC)   . Hypertension   . Neuropathy   . RA (rheumatoid arthritis) (HCC)    History reviewed. No pertinent family history. Past Surgical History:  Procedure Laterality Date  . ABDOMINAL HYSTERECTOMY    . CESAREAN SECTION    . CHOLECYSTECTOMY    . KNEE SURGERY    . MANDIBLE FRACTURE SURGERY     There are no active problems to display for this patient.     Prior to Admission medications   Medication Sig Start Date End Date Taking? Authorizing Provider  baclofen (LIORESAL) 10 MG tablet Take 1 tablet (10 mg total) by mouth 3 (three) times daily. Patient not taking: Reported on 05/09/2017 10/20/15   Kem Boroughs B, FNP  benzonatate (TESSALON) 200 MG capsule Take 1 capsule (200 mg total) by mouth 3 (three) times daily as needed for cough. Patient not  taking: Reported on 05/09/2017 04/28/15   [name], MD  Calcium Carb-Ergocalciferol 500-200 MG-UNIT TABS Take 1 tablet by mouth daily.    [provider]  canagliflozin (INVOKANA) 100 MG TABS tablet Take 100 mg by mouth daily. 02/24/17   [provider]  cyclobenzaprine (FLEXERIL) 5 MG tablet Take 1 tablet (5 mg total) by mouth 3 (three) times daily as needed for muscle spasms. Patient not taking: Reported on 05/09/2017 11/02/16   Menshew, Charlesetta Ivory, PA-C  diclofenac (VOLTAREN) 50 MG EC tablet Take 1 tablet (50 mg total) by mouth 2 (two) times daily. Patient not taking: Reported on 05/09/2017 11/02/16   Menshew, Charlesetta Ivory, PA-C  DOCOSAHEXAENOIC ACID PO Take 1 capsule by mouth daily.    [provider]  gabapentin (NEURONTIN) 300 MG capsule Take 1,200 mg by mouth 3 (three) times daily.     [provider]  glucosamine-chondroitin 500-400 MG tablet Take 1 tablet by mouth daily.    [provider]  ibuprofen (ADVIL,MOTRIN) 200 MG tablet Take 200 mg by mouth every 6 (six) hours as needed.    [provider]  ibuprofen (ADVIL,MOTRIN) 600 MG tablet Take 1 tablet (600 mg total) every 8 (eight) hours as needed by mouth. 05/24/17   Merrily Brittle, MD  insulin NPH Human (HUMULIN N) 100 UNIT/ML injection Inject 0.4 mLs (  40 Units total) into the skin at bedtime. Patient not taking: Reported on 05/09/2017 05/02/16   [name], MD  insulin NPH-regular Human (NOVOLIN 70/30) (70-30) 100 UNIT/ML injection 20-30 units [place] before breakfast and dinner  Check glucose prior to usage Patient taking differently: Inject 32 Units into the skin 2 (two) times daily with a meal.  09/13/16   Schaevitz, Myra Rude, MD  ketorolac (TORADOL) 10 MG tablet Take 1 tablet (10 mg total) by mouth every 8 (eight) hours as needed. Patient not taking: Reported on 05/09/2017 05/12/15   [name], MD  lisinopril-hydrochlorothiazide (PRINZIDE,ZESTORETIC)  10-12.5 MG tablet Take 1 tablet by mouth daily.    [provider]  LORazepam (ATIVAN) 0.5 MG tablet Take 1 tablet (0.5 mg total) by mouth every 8 (eight) hours as needed for anxiety. Patient not taking: Reported on 05/09/2017 03/12/15   Darien Ramus, MD  meloxicam (MOBIC) 15 MG tablet Take 1 tablet (15 mg total) by mouth daily. Patient not taking: Reported on 05/09/2017 10/20/15   Kem Boroughs B, FNP  metFORMIN (GLUCOPHAGE) 500 MG tablet Take 500 mg by mouth every evening.     [provider]  methadone (DOLOPHINE) 5 MG/5ML solution Take 120 mg by mouth daily.     [provider]  Multiple Vitamin (MULTIVITAMIN WITH MINERALS) TABS tablet Take 1 tablet by mouth daily.    [provider]  nitrofurantoin, macrocrystal-monohydrate, (MACROBID) 100 MG capsule Take 1 capsule (100 mg total) by mouth 2 (two) times daily for 3 days. 06/24/17 06/27/17  Willy Eddy, MD  ondansetron (ZOFRAN) 4 MG tablet Take 1 tablet (4 mg total) by mouth every 8 (eight) hours as needed for nausea or vomiting. Patient not taking: Reported on 05/09/2017 10/27/15   Phineas Semen, MD  oxyCODONE-acetaminophen (PERCOCET) 7.5-325 MG tablet Take 1 tablet by mouth every 6 (six) hours as needed for severe pain. Patient not taking: Reported on 05/09/2017 12/10/16   Joni Reining, PA-C  promethazine (PHENERGAN) 12.5 MG tablet Take 1 tablet (12.5 mg total) by mouth every 6 (six) hours as needed for nausea or vomiting. Patient not taking: Reported on 05/09/2017 02/15/16   Willy Eddy, MD  vitamin E 400 UNIT capsule Take 400 Units by mouth daily.    [provider]    Allergies Ultram Marcia Brash hcl]    Social History Social History   Tobacco Use  . Smoking status: Current Every Day Smoker    Packs/day: 0.50    Types: Cigarettes  . Smokeless tobacco: Never Used  Substance Use Topics  . Alcohol use: No  . Drug use: No    Review of Systems Patient denies headaches,  rhinorrhea, blurry vision, numbness, shortness of breath, chest pain, edema, cough, abdominal pain, nausea, vomiting, diarrhea, dysuria, fevers, rashes or hallucinations unless otherwise stated above in HPI. ____________________________________________   PHYSICAL EXAM:  VITAL SIGNS: Vitals:   06/24/17 0831  BP: 135/80  Pulse: 70  Resp: 14  Temp: 99.2 F (37.3 C)  SpO2: 97%    Constitutional: Alert and oriented.  in no acute distress. Eyes: Conjunctivae are normal.  Ears: TM clear bilaterally Head: Atraumatic. Nose: No congestion/rhinnorhea. Mouth/Throat: Mucous membranes are moist.   Neck: No stridor. Painless ROM.  Cardiovascular: Normal rate, regular rhythm. Grossly normal heart sounds.  Good peripheral circulation. Respiratory: Normal respiratory effort.  No retractions. Lungs CTAB. Gastrointestinal: Soft and nontender. No distention. No abdominal bruits. No CVA tenderness. Musculoskeletal: ttp of left superior shoulder, no deformity, lac or  contusion noted No lower extremity tenderness nor edema.  No joint effusions. Neurologic:  Normal speech and language. No gross focal neurologic deficits are appreciated. No facial droop Skin:  Skin is warm, dry and intact. No rash noted. Psychiatric: Mood and affect are normal. Speech and behavior are normal.  ____________________________________________   LABS (all labs ordered are listed, but only abnormal results are displayed)  Results for orders placed or performed during the hospital encounter of 06/24/17 (from the past 24 hour(s))  Urinalysis, Complete w Microscopic     Status: Abnormal   Collection Time: 06/24/17  8:36 AM  Result Value Ref Range   Color, Urine YELLOW (A) YELLOW   APPearance HAZY (A) CLEAR   Specific Gravity, Urine 1.035 (H) 1.005 - 1.030   pH 5.0 5.0 - 8.0   Glucose, UA >=500 (A) NEGATIVE mg/dL   Hgb urine dipstick NEGATIVE NEGATIVE   Bilirubin Urine NEGATIVE NEGATIVE   Ketones, ur NEGATIVE NEGATIVE  mg/dL   Protein, ur NEGATIVE NEGATIVE mg/dL   Nitrite NEGATIVE NEGATIVE   Leukocytes, UA LARGE (A) NEGATIVE   RBC / HPF 6-30 0 - 5 RBC/hpf   WBC, UA TOO NUMEROUS TO COUNT 0 - 5 WBC/hpf   Bacteria, UA NONE SEEN NONE SEEN   Squamous Epithelial / LPF 0-5 (A) NONE SEEN   Non Squamous Epithelial 0-5 (A) NONE SEEN  CBC with Differential/Platelet     Status: None   Collection Time: 06/24/17  8:36 AM  Result Value Ref Range   WBC 9.3 3.6 - 11.0 K/uL   RBC 5.10 3.80 - 5.20 MIL/uL   Hemoglobin 14.7 12.0 - 16.0 g/dL   HCT 24.4 01.0 - 27.2 %   MCV 84.4 80.0 - 100.0 fL   MCH 28.8 26.0 - 34.0 pg   MCHC 34.1 32.0 - 36.0 g/dL   RDW 53.6 64.4 - 03.4 %   Platelets 168 150 - 440 K/uL   Neutrophils Relative % 57 %   Neutro Abs 5.4 1.4 - 6.5 K/uL   Lymphocytes Relative 32 %   Lymphs Abs 2.9 1.0 - 3.6 K/uL   Monocytes Relative 7 %   Monocytes Absolute 0.6 0.2 - 0.9 K/uL   Eosinophils Relative 3 %   Eosinophils Absolute 0.3 0 - 0.7 K/uL   Basophils Relative 1 %   Basophils Absolute 0.0 0 - 0.1 K/uL  Comprehensive metabolic panel     Status: Abnormal   Collection Time: 06/24/17  8:36 AM  Result Value Ref Range   Sodium 137 135 - 145 mmol/L   Potassium 3.9 3.5 - 5.1 mmol/L   Chloride 100 (L) 101 - 111 mmol/L   CO2 27 22 - 32 mmol/L   Glucose, Bld 268 (H) 65 - 99 mg/dL   BUN 21 (H) 6 - 20 mg/dL   Creatinine, Ser 7.42 0.44 - 1.00 mg/dL   Calcium 9.6 8.9 - 59.5 mg/dL   Total Protein 6.8 6.5 - 8.1 g/dL   Albumin 3.8 3.5 - 5.0 g/dL   AST 27 15 - 41 U/L   ALT 22 14 - 54 U/L   Alkaline Phosphatase 64 38 - 126 U/L   Total Bilirubin 0.6 0.3 - 1.2 mg/dL   GFR calc non Af Amer >60 >60 mL/min   GFR calc Af Amer >60 >60 mL/min   Anion gap 10 5 - 15  Troponin I     Status: None   Collection Time: 06/24/17  8:36 AM  Result Value Ref Range  Troponin I <0.03 <0.03 ng/mL   ____________________________________________  EKG My review and personal interpretation at Time: 8:32   Indication: dizziness   Rate: 70  Rhythm: sinus Axis: normal Other: normal intervals, no stemi ____________________________________________  RADIOLOGY  I personally reviewed all radiographic images ordered to evaluate for the above acute complaints and reviewed radiology reports and findings.  These findings were personally discussed with the patient.  Please see medical record for radiology report.  ____________________________________________   PROCEDURES  Procedure(s) performed:  Procedures    Critical Care performed: no ____________________________________________   INITIAL IMPRESSION / ASSESSMENT AND PLAN / ED COURSE  Pertinent labs & imaging results that were available during my care of the patient were reviewed by me and considered in my medical decision making (see chart for details).  DDX: Dehydration, sepsis, pna, uti, hypoglycemia, cva, drug effect, withdrawal, encephalitis   Karen Lindsey is a 51 y.o. who presents to the ED with symptoms as described above.  Patient is well-appearing in no acute distress.  Does have a low-grade temperature but no evidence of sepsis.  Radiographic imaging ordered to evaluate for traumatic injury.  No evidence of CVA or trauma.  No evidence of ACS or dysrhythmia.  Patient does appear dehydrated based on urine specific gravity of 1.03.  IV fluids were given for dehydration.  There are many leukocytes numerous white blood cells.  Will order culture and cover for UTI.  Do suspect symptoms are secondary to dehydration.  Patient able to ambulate with steady gait.  She is tolerating oral hydration.  Patient stable for follow-up with PCP.      ____________________________________________   FINAL CLINICAL IMPRESSION(S) / ED DIAGNOSES  Final diagnoses:  Dizziness  Lightheadedness  Dehydration      NEW MEDICATIONS STARTED DURING THIS VISIT:  This SmartLink is deprecated. Use AVSMEDLIST instead to display the medication list for a patient.   Note:  This  document was prepared using Dragon voice recognition software and may include unintentional dictation errors.    Willy Eddy, MD 06/24/17 1021

## 2017-06-24 NOTE — ED Notes (Signed)
Ambulated to restroom with supervision. C/o dizziness. Call bell given, to call out if needing assistance.

## 2017-06-24 NOTE — ED Notes (Signed)
Patient transported to X-ray 

## 2017-06-24 NOTE — ED Triage Notes (Signed)
Pt presents via EMS c/o dizziness x4-5 month intermittently with recurrent episode this am. Reports fall down step this am, PTA c/o left sided shoulder/hip/leg pain. Pt denies remembering how she fell. Pt A&O x4 upon arrival.

## 2017-08-06 ENCOUNTER — Encounter: Payer: Self-pay | Admitting: Emergency Medicine

## 2017-08-06 ENCOUNTER — Emergency Department
Admission: EM | Admit: 2017-08-06 | Discharge: 2017-08-06 | Disposition: A | Discharge status: Home / Self Care | Payer: Medicaid Other | Attending: Emergency Medicine | Admitting: Emergency Medicine

## 2017-08-06 ENCOUNTER — Emergency Department: Payer: Medicaid Other

## 2017-08-06 DIAGNOSIS — Z794 Long term (current) use of insulin: Secondary | ICD-10-CM | POA: Insufficient documentation

## 2017-08-06 DIAGNOSIS — R05 Cough: Secondary | ICD-10-CM

## 2017-08-06 DIAGNOSIS — F17210 Nicotine dependence, cigarettes, uncomplicated: Secondary | ICD-10-CM | POA: Insufficient documentation

## 2017-08-06 DIAGNOSIS — Z7984 Long term (current) use of oral hypoglycemic drugs: Secondary | ICD-10-CM | POA: Insufficient documentation

## 2017-08-06 DIAGNOSIS — Z79899 Other long term (current) drug therapy: Secondary | ICD-10-CM | POA: Insufficient documentation

## 2017-08-06 DIAGNOSIS — R0789 Other chest pain: Secondary | ICD-10-CM

## 2017-08-06 DIAGNOSIS — E1140 Type 2 diabetes mellitus with diabetic neuropathy, unspecified: Secondary | ICD-10-CM | POA: Insufficient documentation

## 2017-08-06 DIAGNOSIS — R0602 Shortness of breath: Secondary | ICD-10-CM | POA: Insufficient documentation

## 2017-08-06 DIAGNOSIS — I10 Essential (primary) hypertension: Secondary | ICD-10-CM | POA: Insufficient documentation

## 2017-08-06 DIAGNOSIS — R059 Cough, unspecified: Secondary | ICD-10-CM

## 2017-08-06 LAB — URINALYSIS, COMPLETE (UACMP) WITH MICROSCOPIC
BACTERIA UA: NONE SEEN
BILIRUBIN URINE: NEGATIVE
Hgb urine dipstick: NEGATIVE
Ketones, ur: NEGATIVE mg/dL
Leukocytes, UA: NEGATIVE
NITRITE: NEGATIVE
PH: 5 (ref 5.0–8.0)
Protein, ur: NEGATIVE mg/dL
SPECIFIC GRAVITY, URINE: 1.032 — AB (ref 1.005–1.030)

## 2017-08-06 LAB — URINE DRUG SCREEN, QUALITATIVE (ARMC ONLY)
AMPHETAMINES, UR SCREEN: NOT DETECTED
BENZODIAZEPINE, UR SCRN: NOT DETECTED
Barbiturates, Ur Screen: NOT DETECTED
Cannabinoid 50 Ng, Ur ~~LOC~~: NOT DETECTED
Cocaine Metabolite,Ur ~~LOC~~: NOT DETECTED
MDMA (ECSTASY) UR SCREEN: NOT DETECTED
METHADONE SCREEN, URINE: POSITIVE — AB
Opiate, Ur Screen: NOT DETECTED
Phencyclidine (PCP) Ur S: NOT DETECTED
Tricyclic, Ur Screen: NOT DETECTED

## 2017-08-06 LAB — BASIC METABOLIC PANEL
Anion gap: 5 (ref 5–15)
BUN: 18 mg/dL (ref 6–20)
CALCIUM: 8.9 mg/dL (ref 8.9–10.3)
CHLORIDE: 99 mmol/L — AB (ref 101–111)
CO2: 27 mmol/L (ref 22–32)
CREATININE: 0.55 mg/dL (ref 0.44–1.00)
Glucose, Bld: 425 mg/dL — ABNORMAL HIGH (ref 65–99)
Potassium: 4.3 mmol/L (ref 3.5–5.1)
SODIUM: 131 mmol/L — AB (ref 135–145)

## 2017-08-06 LAB — CBC
HCT: 42.1 % (ref 35.0–47.0)
Hemoglobin: 13.9 g/dL (ref 12.0–16.0)
MCH: 28.4 pg (ref 26.0–34.0)
MCHC: 33 g/dL (ref 32.0–36.0)
MCV: 86.1 fL (ref 80.0–100.0)
PLATELETS: 154 10*3/uL (ref 150–440)
RBC: 4.89 MIL/uL (ref 3.80–5.20)
RDW: 13.7 % (ref 11.5–14.5)
WBC: 9.2 10*3/uL (ref 3.6–11.0)

## 2017-08-06 LAB — TROPONIN I

## 2017-08-06 LAB — HEPATIC FUNCTION PANEL
ALBUMIN: 3.8 g/dL (ref 3.5–5.0)
ALK PHOS: 76 U/L (ref 38–126)
ALT: 23 U/L (ref 14–54)
AST: 21 U/L (ref 15–41)
BILIRUBIN TOTAL: 0.5 mg/dL (ref 0.3–1.2)
Total Protein: 6.6 g/dL (ref 6.5–8.1)

## 2017-08-06 LAB — GLUCOSE, CAPILLARY: GLUCOSE-CAPILLARY: 283 mg/dL — AB (ref 65–99)

## 2017-08-06 LAB — LIPASE, BLOOD: LIPASE: 24 U/L (ref 11–51)

## 2017-08-06 LAB — BRAIN NATRIURETIC PEPTIDE: B Natriuretic Peptide: 22 pg/mL (ref 0.0–100.0)

## 2017-08-06 MED ORDER — INSULIN ASPART 100 UNIT/ML ~~LOC~~ SOLN
8.0000 [IU] | Freq: Once | SUBCUTANEOUS | Status: AC
  Administered 2017-08-06: 8 [IU] via SUBCUTANEOUS
  Filled 2017-08-06: qty 1

## 2017-08-06 MED ORDER — BENZONATATE 100 MG PO CAPS: 100.0000 mg | ORAL_CAPSULE | Freq: Four times a day (QID) | ORAL | 0 refills | Status: DC | PRN

## 2017-08-06 MED ORDER — AZITHROMYCIN 500 MG PO TABS
500.0000 mg | ORAL_TABLET | Freq: Once | ORAL | Status: AC
  Administered 2017-08-06: 500 mg via ORAL

## 2017-08-06 MED ORDER — AZITHROMYCIN 500 MG PO TABS
ORAL_TABLET | ORAL | Status: AC
  Filled 2017-08-06: qty 1

## 2017-08-06 MED ORDER — OXYCODONE-ACETAMINOPHEN 5-325 MG PO TABS
1.0000 | ORAL_TABLET | Freq: Once | ORAL | Status: AC
  Administered 2017-08-06: 1 via ORAL
  Filled 2017-08-06: qty 1

## 2017-08-06 MED ORDER — AMOXICILLIN 500 MG PO CAPS: 500.0000 mg | ORAL_CAPSULE | Freq: Three times a day (TID) | ORAL | 0 refills | Status: DC

## 2017-08-06 MED ORDER — INSULIN ASPART 100 UNIT/ML ~~LOC~~ SOLN: 8.0000 [IU] | Freq: Once | SUBCUTANEOUS | Status: DC

## 2017-08-06 NOTE — ED Provider Notes (Addendum)
[facility] Emergency Department Provider Note  ____________________________________________   I have reviewed the triage vital signs and the nursing notes. Where available I have reviewed prior notes and, if possible and indicated, outside hospital notes.    HISTORY  Chief Complaint Chest Pain    HPI Karen Lindsey is a 52 y.o. female who is very well-known to this facility she has been here 11 or 12 times in the last 12 months usually pain related complaints presents today with pain she states is from her chin down to her pubic symphysis essentially, she has had this pain for sometimes months depending on where it is.  She had right-sided abdominal pain for several years she states was new today is that she has been coughing, she states 8 people in her family are coughing and she herself is coughing and when she coughs she has pain in her chest.  The pain is in both sides of her chest.  Is worse with cough.  Nothing makes it better.  Goes in all directions.  She denies fever or productive cough.  She states that she thinks that she has a URI.  She denies any nausea or vomiting or diarrhea.  She has chronic right-sided abdominal pain which is she states have been there forever because her "liver gets swollen".  This is also present but not why she is here today.  Pain is sharp.  Patient does take methadone, she took methadone this morning, but it did not seem to help with her pain, also she is sometimes taking oxycodone but she is out of them.  She is also had runny nose, and mild sore throat   Past Medical History:  Diagnosis Date  . Arthritis    rheumatoid  . Diabetes mellitus without complication (HCC)   . Hypertension   . Neuropathy   . RA (rheumatoid arthritis) (HCC)     There are no active problems to display for this patient.   Past Surgical History:  Procedure Laterality Date  . ABDOMINAL HYSTERECTOMY    . CESAREAN SECTION    . CHOLECYSTECTOMY    .  KNEE SURGERY    . MANDIBLE FRACTURE SURGERY      Prior to Admission medications   Medication Sig Start Date End Date Taking? Authorizing Provider  baclofen (LIORESAL) 10 MG tablet Take 1 tablet (10 mg total) by mouth 3 (three) times daily. Patient not taking: Reported on 05/09/2017 10/20/15   Kem Boroughs B, FNP  benzonatate (TESSALON) 200 MG capsule Take 1 capsule (200 mg total) by mouth 3 (three) times daily as needed for cough. Patient not taking: Reported on 05/09/2017 04/28/15   [name], MD  Calcium Carb-Ergocalciferol 500-200 MG-UNIT TABS Take 1 tablet by mouth daily.    [provider]  canagliflozin (INVOKANA) 100 MG TABS tablet Take 100 mg by mouth daily. 02/24/17   [provider]  cyclobenzaprine (FLEXERIL) 5 MG tablet Take 1 tablet (5 mg total) by mouth 3 (three) times daily as needed for muscle spasms. Patient not taking: Reported on 05/09/2017 11/02/16   Menshew, Charlesetta Ivory, PA-C  diclofenac (VOLTAREN) 50 MG EC tablet Take 1 tablet (50 mg total) by mouth 2 (two) times daily. Patient not taking: Reported on 05/09/2017 11/02/16   Menshew, Charlesetta Ivory, PA-C  DOCOSAHEXAENOIC ACID PO Take 1 capsule by mouth daily.    [provider]  gabapentin (NEURONTIN) 300 MG capsule Take 1,200 mg by mouth 3 (three) times daily.  [provider]  glucosamine-chondroitin 500-400 MG tablet Take 1 tablet by mouth daily.    [provider]  ibuprofen (ADVIL,MOTRIN) 200 MG tablet Take 200 mg by mouth every 6 (six) hours as needed.    [provider]  ibuprofen (ADVIL,MOTRIN) 600 MG tablet Take 1 tablet (600 mg total) every 8 (eight) hours as needed by mouth. 05/24/17   Merrily Brittle, MD  insulin NPH Human (HUMULIN N) 100 UNIT/ML injection Inject 0.4 mLs (40 Units total) into the skin at bedtime. Patient not taking: Reported on 05/09/2017 05/02/16   [name], MD  insulin NPH-regular Human (NOVOLIN 70/30) (70-30) 100  UNIT/ML injection 20-30 units [place] before breakfast and dinner  Check glucose prior to usage Patient taking differently: Inject 32 Units into the skin 2 (two) times daily with a meal.  09/13/16   Schaevitz, Myra Rude, MD  ketorolac (TORADOL) 10 MG tablet Take 1 tablet (10 mg total) by mouth every 8 (eight) hours as needed. Patient not taking: Reported on 05/09/2017 05/12/15   [name], MD  lisinopril-hydrochlorothiazide (PRINZIDE,ZESTORETIC) 10-12.5 MG tablet Take 1 tablet by mouth daily.    [provider]  LORazepam (ATIVAN) 0.5 MG tablet Take 1 tablet (0.5 mg total) by mouth every 8 (eight) hours as needed for anxiety. Patient not taking: Reported on 05/09/2017 03/12/15   Darien Ramus, MD  meloxicam (MOBIC) 15 MG tablet Take 1 tablet (15 mg total) by mouth daily. Patient not taking: Reported on 05/09/2017 10/20/15   Kem Boroughs B, FNP  metFORMIN (GLUCOPHAGE) 500 MG tablet Take 500 mg by mouth every evening.     [provider]  methadone (DOLOPHINE) 5 MG/5ML solution Take 120 mg by mouth daily.     [provider]  Multiple Vitamin (MULTIVITAMIN WITH MINERALS) TABS tablet Take 1 tablet by mouth daily.    [provider]  ondansetron (ZOFRAN) 4 MG tablet Take 1 tablet (4 mg total) by mouth every 8 (eight) hours as needed for nausea or vomiting. Patient not taking: Reported on 05/09/2017 10/27/15   Phineas Semen, MD  ondansetron [facility]) 4 MG tablet Take 1 tablet (4 mg total) by mouth daily as needed for nausea or vomiting. 06/24/17 06/24/18  Willy Eddy, MD  oxyCODONE-acetaminophen (PERCOCET) 7.5-325 MG tablet Take 1 tablet by mouth every 6 (six) hours as needed for severe pain. Patient not taking: Reported on 05/09/2017 12/10/16   Joni Reining, PA-C  promethazine (PHENERGAN) 12.5 MG tablet Take 1 tablet (12.5 mg total) by mouth every 6 (six) hours as needed for nausea or vomiting. Patient not taking: Reported on 05/09/2017 02/15/16    Willy Eddy, MD  vitamin E 400 UNIT capsule Take 400 Units by mouth daily.    [provider]    Allergies Ultram [tramadol hcl]  No family history on file.  Social History Social History   Tobacco Use  . Smoking status: Current Every Day Smoker    Packs/day: 0.50    Types: Cigarettes  . Smokeless tobacco: Never Used  Substance Use Topics  . Alcohol use: No  . Drug use: No    Review of Systems Constitutional: No fever/chills Eyes: No visual changes. ENT: Mild sore throat. No stiff neck no neck pain positive rhinorrhea Cardiovascular: Above regarding chest pain Respiratory: Denies shortness of breath.  Positive cough Gastrointestinal:   no vomiting.  No diarrhea.  No constipation. Genitourinary: Negative for dysuria. Musculoskeletal: Negative lower extremity swelling Skin: Negative for rash. Neurological: Negative for severe  headaches, focal weakness or numbness.   ____________________________________________   PHYSICAL EXAM:  VITAL SIGNS: ED Triage Vitals [08/06/17 1500]  Enc Vitals Group     BP (!) 168/89     Pulse Rate 72     Resp 20     Temp 97.8 F (36.6 C)     Temp Source Oral     SpO2 96 %     Weight 147 lb (66.7 kg)     Height 5\' 1"  (1.549 m)     Head Circumference      Peak Flow      Pain Score 9     Pain Loc      Pain Edu?      Excl. in GC?     Constitutional: Alert and oriented. Well appearing and in no acute distress.  She is anxious but nontoxic in appearance Eyes: Conjunctivae are normal Head: Atraumatic HEENT: Positive clear congestion/rhinnorhea. Mucous membranes are moist.  Oropharynx non-erythematous Neck:   Nontender with no meningismus, no masses, no stridor Cardiovascular: Normal rate, regular rhythm. Grossly normal heart sounds.  Good peripheral circulation. Thorax: Tenderness palpation the right and left chest wall, when I touch this area patient states "ouch that the pain right there" and post back.  No crepitus  no flail chest no evidence of acute injury, female nurse   present. Respiratory: Normal respiratory effort.  No retractions. Lungs CTAB. Abdominal: Soft and light tenderness to palpation epigastric and right upper quadrant distractible. No distention. No guarding no rebound Back:  There is no focal tenderness or step off.  there is no midline tenderness there are no lesions noted. there is no CVA tenderness Musculoskeletal: No lower extremity tenderness, no upper extremity tenderness. No joint effusions, no DVT signs strong distal pulses no edema Neurologic:  Normal speech and language. No gross focal neurologic deficits are appreciated.  Skin:  Skin is warm, dry and intact. No rash noted. Psychiatric: Mood and affect are normal. Speech and behavior are normal.  ____________________________________________   LABS (all labs ordered are listed, but only abnormal results are displayed)  Labs Reviewed  BASIC METABOLIC PANEL - Abnormal; Notable for the following components:      Result Value   Sodium 131 (*)    Chloride 99 (*)    Glucose, Bld 425 (*)    All other components within normal limits  CBC  TROPONIN I  URINALYSIS, COMPLETE (UACMP) WITH MICROSCOPIC  HEPATIC FUNCTION PANEL  LIPASE, BLOOD  BRAIN NATRIURETIC PEPTIDE    Pertinent labs  results that were available during my care of the patient were reviewed by me and considered in my medical decision making (see chart for details). ____________________________________________  EKG  I personally interpreted any EKGs ordered by me or triage Sinus rhythm rate 61 no acute ST elevation or depression nonspecific ST changes, no acute ischemic changes  ____________________________________________  RADIOLOGY  Pertinent labs & imaging results that were available during my care of the patient were reviewed by me and considered in my medical decision making (see chart for details). If possible, patient and/or family made aware of any  abnormal findings.  Dg Chest 2 View  Result Date: 08/06/2017 CLINICAL DATA:  New onset anterior chest pain through to upper back, history hypertension, diabetes mellitus EXAM: CHEST  2 VIEW COMPARISON:  06/24/2017 FINDINGS: Upper normal size of cardiac silhouette. Slight vascular congestion. Increased interstitial markings since the previous exam question minimal interstitial infiltrates or edema. No segmental consolidation, pleural effusion, or pneumothorax.  Bones unremarkable. IMPRESSION: Increased interstitial markings in both lungs versus prior exam which could represent minimal interstitial infiltrate or edema. Electronically Signed   By: Ulyses Southward M.D.   On: 08/06/2017 15:57   ____________________________________________    PROCEDURES  Procedure(s) performed: None  Procedures  Critical Care performed: None  ____________________________________________   INITIAL IMPRESSION / ASSESSMENT AND PLAN / ED COURSE  Pertinent labs & imaging results that were available during my care of the patient were reviewed by me and considered in my medical decision making (see chart for details).  Shin with chronic pain issues presents today with thorax pain, and multiple different areas, most of the acute complaint seems to revolve around pain in her chest wall from coughing.  Chest x-ray does not show definitive pneumonia but there is some questionable atypical findings, we will send a BNP although low suspicion for CHF as a direct contributor of this URI symptoms.  Low suspicion for ACS PE or dissection.  Patient is also complaining of chronic abdominal pain, for which she would like also pain medication.  We will send liver function tests and lipase is been going on for a long time and certainly nonsurgical at this time, we have seen her for abdominal pain going back many months in the same area and today which is pain in her chest when she coughs.  ----------------------------------------- 5:35  PM on 08/06/2017 -----------------------------------------  Blood sugar is elevated but it usually is, no evidence of CHF UTI white count is normal troponin is negative despite 2+  days of symptoms Serial enzymes will be of any use, patient blood sugars 425 last time we saw her was 268 before that 314 529 448 and 174 and 314 she is very poorly controlled diabetes and I have counseled her about that I do not think it necessarily is an acute phase reactant or represents some sort of a cortisol burst given her chronic poor control.  Chest x-ray is equivocal but she does have a cough, given her history we will send her home with antibiotics for possible atypical pneumonia and chest wall pain.  Patient has a ride home she did receive a Percocet here I would not send her home with narcotics however.  Return precautions follow-up given and understood at this time, there does not appear to be clinical evidence to support the diagnosis of pulmonary embolus, dissection, myocarditis, endocarditis, pericarditis, pericardial tamponade, acute coronary syndrome, pneumothorax, pneumonia, or any other acute intrathoracic pathology that will require admission or acute intervention. Nor is there evidence of any significant intra-abdominal pathology causing this discomfort.   ____________________________________________   FINAL CLINICAL IMPRESSION(S) / ED DIAGNOSES  Final diagnoses:  None      This chart was dictated using voice recognition software.  Despite best efforts to proofread,  errors can occur which can change meaning.      Jeanmarie Plant, MD 08/06/17 1615    Jeanmarie Plant, MD 08/06/17 1736

## 2017-08-06 NOTE — ED Notes (Signed)
Pt expressing frustration with being discharged, states she still doesn't know what is causing her pain. EDP spoke with patient already. AVS reviewed with patient with prescriptions, discharge information, and follow-up plan of care. Pt asking for copies of lab work. Explained to pt that she will need to either contact medical records or that lab results are available on MyChart with instructions for access included in discharge paperwork. Pt still upset about not having lab results printed. Consulting civil engineer and patient relations notified. Patient relations staff went to speak with patient and reported that pt had already left.

## 2017-08-06 NOTE — ED Triage Notes (Signed)
Pt comes into the ED via POV c/o left sided chest pain that radiates into the left arm and back.  Patient states she also feels short of breath and nausea.  Patient  Denies any cardiac history.  Patient does not present diaphoretic at this time and has even and unlabored respirations.

## 2017-08-06 NOTE — ED Notes (Signed)

## 2017-09-15 ENCOUNTER — Emergency Department: Payer: Self-pay

## 2017-09-15 ENCOUNTER — Emergency Department
Admission: EM | Admit: 2017-09-15 | Discharge: 2017-09-15 | Disposition: A | Discharge status: Home / Self Care | Payer: Self-pay | Attending: Emergency Medicine | Admitting: Emergency Medicine

## 2017-09-15 ENCOUNTER — Other Ambulatory Visit: Payer: Self-pay

## 2017-09-15 DIAGNOSIS — I10 Essential (primary) hypertension: Secondary | ICD-10-CM | POA: Insufficient documentation

## 2017-09-15 DIAGNOSIS — R1013 Epigastric pain: Secondary | ICD-10-CM

## 2017-09-15 DIAGNOSIS — F17210 Nicotine dependence, cigarettes, uncomplicated: Secondary | ICD-10-CM | POA: Insufficient documentation

## 2017-09-15 DIAGNOSIS — E1140 Type 2 diabetes mellitus with diabetic neuropathy, unspecified: Secondary | ICD-10-CM | POA: Insufficient documentation

## 2017-09-15 DIAGNOSIS — Z794 Long term (current) use of insulin: Secondary | ICD-10-CM | POA: Insufficient documentation

## 2017-09-15 DIAGNOSIS — Z79899 Other long term (current) drug therapy: Secondary | ICD-10-CM | POA: Insufficient documentation

## 2017-09-15 DIAGNOSIS — R0789 Other chest pain: Secondary | ICD-10-CM

## 2017-09-15 LAB — HEPATIC FUNCTION PANEL
ALK PHOS: 69 U/L (ref 38–126)
ALT: 23 U/L (ref 14–54)
AST: 21 U/L (ref 15–41)
Albumin: 3.8 g/dL (ref 3.5–5.0)
BILIRUBIN TOTAL: 0.5 mg/dL (ref 0.3–1.2)
Total Protein: 6.8 g/dL (ref 6.5–8.1)

## 2017-09-15 LAB — CBC
HEMATOCRIT: 42.6 % (ref 35.0–47.0)
Hemoglobin: 14 g/dL (ref 12.0–16.0)
MCH: 28.3 pg (ref 26.0–34.0)
MCHC: 32.9 g/dL (ref 32.0–36.0)
MCV: 86 fL (ref 80.0–100.0)
PLATELETS: 162 10*3/uL (ref 150–440)
RBC: 4.95 MIL/uL (ref 3.80–5.20)
RDW: 13.5 % (ref 11.5–14.5)
WBC: 9.2 10*3/uL (ref 3.6–11.0)

## 2017-09-15 LAB — BASIC METABOLIC PANEL
Anion gap: 8 (ref 5–15)
BUN: 18 mg/dL (ref 6–20)
CHLORIDE: 105 mmol/L (ref 101–111)
CO2: 27 mmol/L (ref 22–32)
CREATININE: 0.63 mg/dL (ref 0.44–1.00)
Calcium: 8.9 mg/dL (ref 8.9–10.3)
GFR calc Af Amer: >60 mL/min (ref >60)
GFR calc non Af Amer: >60 mL/min (ref >60)
GLUCOSE: 236 mg/dL — AB (ref 65–99)
POTASSIUM: 4.2 mmol/L (ref 3.5–5.1)
Sodium: 140 mmol/L (ref 135–145)

## 2017-09-15 LAB — URINALYSIS, COMPLETE (UACMP) WITH MICROSCOPIC
Bacteria, UA: NONE SEEN
Bilirubin Urine: NEGATIVE
Hgb urine dipstick: NEGATIVE
KETONES UR: NEGATIVE mg/dL
Leukocytes, UA: NEGATIVE
Nitrite: NEGATIVE
PH: 5 (ref 5.0–8.0)
PROTEIN: NEGATIVE mg/dL
Specific Gravity, Urine: 1.036 — ABNORMAL HIGH (ref 1.005–1.030)

## 2017-09-15 LAB — TROPONIN I: Troponin I: <0.03 ng/mL (ref <0.03)

## 2017-09-15 LAB — LIPASE, BLOOD: LIPASE: 23 U/L (ref 11–51)

## 2017-09-15 MED ORDER — FAMOTIDINE 20 MG PO TABS
ORAL_TABLET | ORAL | Status: AC
  Administered 2017-09-15: 40 mg via ORAL
  Filled 2017-09-15: qty 2

## 2017-09-15 MED ORDER — GI COCKTAIL ~~LOC~~
ORAL | Status: AC
  Administered 2017-09-15: 30 mL via ORAL
  Filled 2017-09-15: qty 30

## 2017-09-15 MED ORDER — KETOROLAC TROMETHAMINE 30 MG/ML IJ SOLN
15.0000 mg | Freq: Once | INTRAMUSCULAR | Status: AC
  Administered 2017-09-15: 15 mg via INTRAVENOUS
  Filled 2017-09-15: qty 1

## 2017-09-15 MED ORDER — FAMOTIDINE 20 MG PO TABS
40.0000 mg | ORAL_TABLET | Freq: Once | ORAL | Status: AC
  Administered 2017-09-15: 40 mg via ORAL

## 2017-09-15 MED ORDER — MORPHINE SULFATE (PF) 4 MG/ML IV SOLN
6.0000 mg | Freq: Once | INTRAVENOUS | Status: AC
  Administered 2017-09-15: 6 mg via INTRAVENOUS
  Filled 2017-09-15: qty 2

## 2017-09-15 MED ORDER — IOPAMIDOL (ISOVUE-300) INJECTION 61%
100.0000 mL | Freq: Once | INTRAVENOUS | Status: AC | PRN
  Administered 2017-09-15: 100 mL via INTRAVENOUS
  Filled 2017-09-15: qty 100

## 2017-09-15 MED ORDER — ONDANSETRON HCL 4 MG/2ML IJ SOLN
4.0000 mg | Freq: Once | INTRAMUSCULAR | Status: AC
  Administered 2017-09-15: 4 mg via INTRAVENOUS
  Filled 2017-09-15: qty 2

## 2017-09-15 MED ORDER — FAMOTIDINE 20 MG PO TABS: 20.0000 mg | ORAL_TABLET | Freq: Two times a day (BID) | ORAL | 0 refills | Status: DC

## 2017-09-15 MED ORDER — GI COCKTAIL ~~LOC~~
30.0000 mL | Freq: Once | ORAL | Status: AC
  Administered 2017-09-15: 30 mL via ORAL

## 2017-09-15 NOTE — ED Triage Notes (Signed)
Left sided chest pain that began today. Right upper quadrant abdominal pain. Nausea. All sx began today. Pt tearful. RR even and unlabored.

## 2017-09-15 NOTE — Discharge Instructions (Signed)
Please take your antacids as prescribed and make an appointment to follow-up with your primary care physician this coming week for reevaluation.  Return to the emergency department sooner for any concerns whatsoever such as fevers, chills, worsening pain, if you cannot eat or drink, or for any other issues whatsoever.  It was a pleasure to take care of you today, and thank you for coming to our emergency department.  If you have any questions or concerns before leaving please ask the nurse to grab me and I'm more than happy to go through your aftercare instructions again.  If you were prescribed any opioid pain medication today such as Norco, Vicodin, Percocet, morphine, hydrocodone, or oxycodone please make sure you do not drive when you are taking this medication as it can alter your ability to drive safely.  If you have any concerns once you are home that you are not improving or are in fact getting worse before you can make it to your follow-up appointment, please do not hesitate to call 911 and come back for further evaluation.  Merrily Brittle, MD  Results for orders placed or performed during the hospital encounter of 09/15/17  Basic metabolic panel  Result Value Ref Range   Sodium 140 135 - 145 mmol/L   Potassium 4.2 3.5 - 5.1 mmol/L   Chloride 105 101 - 111 mmol/L   CO2 27 22 - 32 mmol/L   Glucose, Bld 236 (H) 65 - 99 mg/dL   BUN 18 6 - 20 mg/dL   Creatinine, Ser 4.26 0.44 - 1.00 mg/dL   Calcium 8.9 8.9 - 83.4 mg/dL   GFR calc non Af Amer >60 >60 mL/min   GFR calc Af Amer >60 >60 mL/min   Anion gap 8 5 - 15  CBC  Result Value Ref Range   WBC 9.2 3.6 - 11.0 K/uL   RBC 4.95 3.80 - 5.20 MIL/uL   Hemoglobin 14.0 12.0 - 16.0 g/dL   HCT 19.6 22.2 - 97.9 %   MCV 86.0 80.0 - 100.0 fL   MCH 28.3 26.0 - 34.0 pg   MCHC 32.9 32.0 - 36.0 g/dL   RDW 89.2 11.9 - 41.7 %   Platelets 162 150 - 440 K/uL  Troponin I  Result Value Ref Range   Troponin I <0.03 <0.03 ng/mL  Hepatic function  panel  Result Value Ref Range   Total Protein 6.8 6.5 - 8.1 g/dL   Albumin 3.8 3.5 - 5.0 g/dL   AST 21 15 - 41 U/L   ALT 23 14 - 54 U/L   Alkaline Phosphatase 69 38 - 126 U/L   Total Bilirubin 0.5 0.3 - 1.2 mg/dL   Bilirubin, Direct <4.0 (L) 0.1 - 0.5 mg/dL   Indirect Bilirubin NOT CALCULATED 0.3 - 0.9 mg/dL  Lipase, blood  Result Value Ref Range   Lipase 23 11 - 51 U/L  Urinalysis, Complete w Microscopic  Result Value Ref Range   Color, Urine YELLOW (A) YELLOW   APPearance CLEAR (A) CLEAR   Specific Gravity, Urine 1.036 (H) 1.005 - 1.030   pH 5.0 5.0 - 8.0   Glucose, UA >=500 (A) NEGATIVE mg/dL   Hgb urine dipstick NEGATIVE NEGATIVE   Bilirubin Urine NEGATIVE NEGATIVE   Ketones, ur NEGATIVE NEGATIVE mg/dL   Protein, ur NEGATIVE NEGATIVE mg/dL   Nitrite NEGATIVE NEGATIVE   Leukocytes, UA NEGATIVE NEGATIVE   RBC / HPF 0-5 0 - 5 RBC/hpf   WBC, UA 0-5 0 - 5 WBC/hpf  Bacteria, UA NONE SEEN NONE SEEN   Squamous Epithelial / LPF 0-5 (A) NONE SEEN   Mucus PRESENT    Dg Chest 2 View  Result Date: 09/15/2017 CLINICAL DATA:  Acute chest pain. EXAM: CHEST - 2 VIEW COMPARISON:  08/06/2017 prior radiographs FINDINGS: The cardiomediastinal silhouette is unremarkable. Mild peribronchial thickening is unchanged. There is no evidence of focal airspace disease, pulmonary edema, suspicious pulmonary nodule/mass, pleural effusion, or pneumothorax. No acute bony abnormalities are identified. IMPRESSION: No active cardiopulmonary disease. Electronically Signed   By: Harmon Pier M.D.   On: 09/15/2017 13:57   Ct Abdomen Pelvis W Contrast  Result Date: 09/15/2017 CLINICAL DATA:  LEFT chest and RIGHT upper quadrant pain today with nausea. History of hypertension, diabetes, rheumatoid arthritis, cholecystectomy and hysterectomy. EXAM: CT ABDOMEN AND PELVIS WITH CONTRAST TECHNIQUE: Multidetector CT imaging of the abdomen and pelvis was performed using the standard protocol following bolus administration of  intravenous contrast. CONTRAST:  ISOVUE-300 IOPAMIDOL (ISOVUE-300) INJECTION 61% COMPARISON:  Abdominal ultrasound February 07, 2016 FINDINGS: LOWER CHEST: Lung bases are clear. Included heart size is normal. No pericardial effusion. HEPATOBILIARY: Extremely hypodense liver with nodular contour. Mild intra and extrahepatic biliary dilatation, status post cholecystectomy. No radiopaque choledocholithiasis. PANCREAS: Normal. SPLEEN: Normal. ADRENALS/URINARY TRACT: Kidneys are orthotopic, demonstrating symmetric enhancement. No nephrolithiasis, hydronephrosis or solid renal masses. 17 mm simple cyst upper pole LEFT kidney. The unopacified ureters are normal in course and caliber. Delayed imaging through the kidneys demonstrates symmetric prompt contrast excretion within the proximal urinary collecting system. Urinary bladder is well distended with mild disproportionate bladder wall thickening. Normal adrenal glands. STOMACH/BOWEL: The stomach, small and large bowel are normal in course and caliber without inflammatory changes. Moderate amount of retained large bowel stool. Small amount of small bowel feces seen with chronic stasis. For bowel pathology decreased without oral contrast. Normal appendix. VASCULAR/LYMPHATIC: Aortoiliac vessels are normal in course and caliber. Moderate to severe calcific atherosclerosis aortoiliac vessels. No lymphadenopathy by CT size criteria. REPRODUCTIVE: Status post hysterectomy. OTHER: No intraperitoneal free fluid or free air. MUSCULOSKELETAL: Nonacute. Osteopenia. Multilevel Schmorl's nodes. Anterior pelvic wall scarring. Small fat containing umbilical hernia. IMPRESSION: 1. Mild bladder wall thickening, possible cystitis. 2. Moderate amount of retained large bowel stool without bowel obstruction 3. Hepatic steatosis and early cirrhosis. 4. Status post cholecystectomy with minimal postprocedural biliary dilatation. Electronically Signed   By: Awilda Metro M.D.   On:  09/15/2017 15:36

## 2017-09-15 NOTE — ED Provider Notes (Signed)
[facility] Emergency Department Provider Note  ____________________________________________   First MD Initiated Contact with Patient 09/15/17 1408     (approximate)  I have reviewed the triage vital signs and the nursing notes.   HISTORY  Chief Complaint Abdominal Pain and Chest Pain    HPI Karen Lindsey is a 52 y.o. female who self presents to the emergency department with right upper abdominal pain that began this morning.  Symptoms began insidiously and have been slowly progressive.  They are constant now.  They seem to be somewhat worsened with eating.  Associated with nausea but no vomiting.  The pain has progressed up she now reports left lateral nonradiating nonexertional left shoulder and left chest pain.  She denies fevers or chills.  She has a past surgical history of abdominal hysterectomy and cholecystectomy.  She denies diarrhea or constipation.  Her symptoms seem to be worse with eating and movement and nothing particular seems to make them better.  Past Medical History:  Diagnosis Date  . Arthritis    rheumatoid  . Diabetes mellitus without complication (HCC)   . Hypertension   . Neuropathy   . RA (rheumatoid arthritis) (HCC)     There are no active problems to display for this patient.   Past Surgical History:  Procedure Laterality Date  . ABDOMINAL HYSTERECTOMY    . CESAREAN SECTION    . CHOLECYSTECTOMY    . KNEE SURGERY    . MANDIBLE FRACTURE SURGERY      Prior to Admission medications   Medication Sig Start Date End Date Taking? Authorizing Provider  amoxicillin (AMOXIL) 500 MG capsule Take 1 capsule (500 mg total) by mouth 3 (three) times daily. 08/06/17   Jeanmarie Plant, MD  baclofen (LIORESAL) 10 MG tablet Take 1 tablet (10 mg total) by mouth 3 (three) times daily. Patient not taking: Reported on 05/09/2017 10/20/15   Kem Boroughs B, FNP  benzonatate (TESSALON PERLES) 100 MG capsule Take 1 capsule (100 mg total) by  mouth every 6 (six) hours as needed for cough. 08/06/17 08/06/18  Jeanmarie Plant, MD  Calcium Carb-Ergocalciferol 500-200 MG-UNIT TABS Take 1 tablet by mouth daily.    [provider]  canagliflozin (INVOKANA) 100 MG TABS tablet Take 100 mg by mouth daily. 02/24/17   [provider]  cyclobenzaprine (FLEXERIL) 5 MG tablet Take 1 tablet (5 mg total) by mouth 3 (three) times daily as needed for muscle spasms. Patient not taking: Reported on 05/09/2017 11/02/16   Menshew, Charlesetta Ivory, PA-C  diclofenac (VOLTAREN) 50 MG EC tablet Take 1 tablet (50 mg total) by mouth 2 (two) times daily. Patient not taking: Reported on 05/09/2017 11/02/16   Menshew, Charlesetta Ivory, PA-C  DOCOSAHEXAENOIC ACID PO Take 1 capsule by mouth daily.    [provider]  famotidine (PEPCID) 20 MG tablet Take 1 tablet (20 mg total) by mouth 2 (two) times daily. 09/15/17 09/15/18  Karen Brittle, MD  gabapentin (NEURONTIN) 300 MG capsule Take 1,200 mg by mouth 3 (three) times daily.     [provider]  glucosamine-chondroitin 500-400 MG tablet Take 1 tablet by mouth daily.    [provider]  ibuprofen (ADVIL,MOTRIN) 200 MG tablet Take 200 mg by mouth every 6 (six) hours as needed.    [provider]  ibuprofen (ADVIL,MOTRIN) 600 MG tablet Take 1 tablet (600 mg total) every 8 (eight) hours as needed by mouth. 05/24/17   Karen Brittle, MD  insulin  NPH Human (HUMULIN N) 100 UNIT/ML injection Inject 0.4 mLs (40 Units total) into the skin at bedtime. Patient not taking: Reported on 05/09/2017 05/02/16   [name], MD  insulin NPH-regular Human (NOVOLIN 70/30) (70-30) 100 UNIT/ML injection 20-30 units [place] before breakfast and dinner  Check glucose prior to usage Patient taking differently: Inject 32 Units into the skin 2 (two) times daily with a meal.  09/13/16   Schaevitz, Myra Rude, MD  ketorolac (TORADOL) 10 MG tablet Take 1 tablet (10 mg total) by mouth every 8 (eight)  hours as needed. Patient not taking: Reported on 05/09/2017 05/12/15   [name], MD  lisinopril-hydrochlorothiazide (PRINZIDE,ZESTORETIC) 10-12.5 MG tablet Take 1 tablet by mouth daily.    [provider]  LORazepam (ATIVAN) 0.5 MG tablet Take 1 tablet (0.5 mg total) by mouth every 8 (eight) hours as needed for anxiety. Patient not taking: Reported on 05/09/2017 03/12/15   Darien Ramus, MD  meloxicam (MOBIC) 15 MG tablet Take 1 tablet (15 mg total) by mouth daily. Patient not taking: Reported on 05/09/2017 10/20/15   Kem Boroughs B, FNP  metFORMIN (GLUCOPHAGE) 500 MG tablet Take 500 mg by mouth every evening.     [provider]  methadone (DOLOPHINE) 5 MG/5ML solution Take 120 mg by mouth daily.     [provider]  Multiple Vitamin (MULTIVITAMIN WITH MINERALS) TABS tablet Take 1 tablet by mouth daily.    [provider]  ondansetron (ZOFRAN) 4 MG tablet Take 1 tablet (4 mg total) by mouth every 8 (eight) hours as needed for nausea or vomiting. Patient not taking: Reported on 05/09/2017 10/27/15   Phineas Semen, MD  ondansetron [facility]) 4 MG tablet Take 1 tablet (4 mg total) by mouth daily as needed for nausea or vomiting. 06/24/17 06/24/18  Willy Eddy, MD  oxyCODONE-acetaminophen (PERCOCET) 7.5-325 MG tablet Take 1 tablet by mouth every 6 (six) hours as needed for severe pain. Patient not taking: Reported on 05/09/2017 12/10/16   Joni Reining, PA-C  promethazine (PHENERGAN) 12.5 MG tablet Take 1 tablet (12.5 mg total) by mouth every 6 (six) hours as needed for nausea or vomiting. Patient not taking: Reported on 05/09/2017 02/15/16   Willy Eddy, MD  vitamin E 400 UNIT capsule Take 400 Units by mouth daily.    [provider]    Allergies Ultram [tramadol hcl]  No family history on file.  Social History Social History   Tobacco Use  . Smoking status: Current Every Day Smoker    Packs/day: 0.50    Types: Cigarettes    . Smokeless tobacco: Never Used  Substance Use Topics  . Alcohol use: No  . Drug use: No    Review of Systems Constitutional: No fever/chills Eyes: No visual changes. ENT: No sore throat. Cardiovascular: Positive for chest pain. Respiratory: Denies shortness of breath. Gastrointestinal: Positive for abdominal pain.  Positive for nausea, no vomiting.  No diarrhea.  No constipation. Genitourinary: Negative for dysuria. Musculoskeletal: Negative for back pain. Skin: Negative for rash. Neurological: Negative for headaches, focal weakness or numbness.   ____________________________________________   PHYSICAL EXAM:  VITAL SIGNS: ED Triage Vitals [09/15/17 1259]  Enc Vitals Group     BP (!) 154/79     Pulse Rate (!) 54     Resp 18     Temp 98.7 F (37.1 C)     Temp Source Oral     SpO2 98 %     Weight 145 lb (65.8  kg)     Height      Head Circumference      Peak Flow      Pain Score 8     Pain Loc      Pain Edu?      Excl. in GC?     Constitutional: Alert and oriented x4 appears uncomfortable curled on her side tearful Eyes: PERRL EOMI. Head: Atraumatic. Nose: No congestion/rhinnorhea. Mouth/Throat: No trismus Neck: No stridor.   Cardiovascular: Bradycardic rate, regular rhythm. Grossly normal heart sounds.  Good peripheral circulation. Respiratory: Normal respiratory effort.  No retractions. Lungs CTAB and moving good air Gastrointestinal: Soft abdomen although diffusely quite tender particularly in the right upper quadrant with rebound and guarding but no frank peritonitis Musculoskeletal: No lower extremity edema   Neurologic:  Normal speech and language. No gross focal neurologic deficits are appreciated. Skin:  Skin is warm, dry and intact. No rash noted. Psychiatric: Mood and affect are normal. Speech and behavior are normal.    ____________________________________________   DIFFERENTIAL includes but not limited to  Perforation, small bowel  obstruction, large bowel obstruction, appendicitis, diverticulitis ____________________________________________   LABS (all labs ordered are listed, but only abnormal results are displayed)  Labs Reviewed  BASIC METABOLIC PANEL - Abnormal; Notable for the following components:      Result Value   Glucose, Bld 236 (*)    All other components within normal limits  HEPATIC FUNCTION PANEL - Abnormal; Notable for the following components:   Bilirubin, Direct <0.1 (*)    All other components within normal limits  URINALYSIS, COMPLETE (UACMP) WITH MICROSCOPIC - Abnormal; Notable for the following components:   Color, Urine YELLOW (*)    APPearance CLEAR (*)    Specific Gravity, Urine 1.036 (*)    Glucose, UA >=500 (*)    Squamous Epithelial / LPF 0-5 (*)    All other components within normal limits  CBC  TROPONIN I  LIPASE, BLOOD    Lab work reviewed by me shows glucose in her urine otherwise unremarkable __________________________________________  EKG  ED ECG REPORT I, Karen Brittle, the attending physician, personally viewed and interpreted this ECG.  Date: 09/17/2017 EKG Time:  Rate: 53 Rhythm: Sinus bradycardia QRS Axis: normal Intervals: normal ST/T Wave abnormalities: normal Narrative Interpretation: no evidence of acute ischemia  ____________________________________________  RADIOLOGY  CT abdomen pelvis reviewed by me concerning for possible early cirrhosis ____________________________________________   PROCEDURES  Procedure(s) performed: no  Procedures  Critical Care performed: no  Observation: no ____________________________________________   INITIAL IMPRESSION / ASSESSMENT AND PLAN / ED COURSE  Pertinent labs & imaging results that were available during my care of the patient were reviewed by me and considered in my medical decision making (see chart for details).  The patient arrives uncomfortable appearing with a tender abdominal exam.   Differential is broad but at this point she requires CT scan with IV contrast.     ----------------------------------------- 4:26 PM on 09/15/2017 -----------------------------------------  Patient's pain persist despite unremarkable CT.  GI cocktail and Pepcid are pending. ____________________________________________   The patient was sleeping after the GI cocktail however when she awakes her pain returns.  I had a lengthy discussion with the patient regarding the diagnostic uncertainty and that just because we did not find an etiology of her pain today does not mean that her workup is complete.  She understands a follow-up with primary care for further evaluation pending trial of H2 blocker.  Discharged home in improved condition verbalizes  understanding and agreement with the plan.  FINAL CLINICAL IMPRESSION(S) / ED DIAGNOSES  Final diagnoses:  Epigastric pain  Atypical chest pain      NEW MEDICATIONS STARTED DURING THIS VISIT:  Discharge Medication List as of 09/15/2017  5:20 PM    START taking these medications   Details  famotidine (PEPCID) 20 MG tablet Take 1 tablet (20 mg total) by mouth 2 (two) times daily., Starting Fri 09/15/2017, Until Sat 09/15/2018, Print         Note:  This document was prepared using Dragon voice recognition software and may include unintentional dictation errors.     Karen Brittle, MD 09/17/17 [phone]

## 2017-09-15 NOTE — ED Notes (Signed)
Pt discharged to home.  Family member driving.  Discharge instructions reviewed.  Verbalized understanding.  No questions or concerns at this time.  Teach back verified.  Pt in NAD.  No items left in ED.   

## 2017-09-15 NOTE — ED Notes (Signed)
Pt c/o right lower abdominal pain for the last few days. Pt reports nausea.

## 2017-10-21 ENCOUNTER — Emergency Department
Admission: EM | Admit: 2017-10-21 | Discharge: 2017-10-21 | Disposition: A | Discharge status: Home / Self Care | Payer: Self-pay | Attending: Emergency Medicine | Admitting: Emergency Medicine

## 2017-10-21 DIAGNOSIS — I10 Essential (primary) hypertension: Secondary | ICD-10-CM | POA: Insufficient documentation

## 2017-10-21 DIAGNOSIS — Z79899 Other long term (current) drug therapy: Secondary | ICD-10-CM | POA: Insufficient documentation

## 2017-10-21 DIAGNOSIS — Z794 Long term (current) use of insulin: Secondary | ICD-10-CM | POA: Insufficient documentation

## 2017-10-21 DIAGNOSIS — F17210 Nicotine dependence, cigarettes, uncomplicated: Secondary | ICD-10-CM | POA: Insufficient documentation

## 2017-10-21 DIAGNOSIS — R55 Syncope and collapse: Secondary | ICD-10-CM | POA: Insufficient documentation

## 2017-10-21 DIAGNOSIS — E1140 Type 2 diabetes mellitus with diabetic neuropathy, unspecified: Secondary | ICD-10-CM | POA: Insufficient documentation

## 2017-10-21 LAB — BASIC METABOLIC PANEL
ANION GAP: 4 — AB (ref 5–15)
BUN: 21 mg/dL — ABNORMAL HIGH (ref 6–20)
CALCIUM: 8.9 mg/dL (ref 8.9–10.3)
CO2: 30 mmol/L (ref 22–32)
Chloride: 102 mmol/L (ref 101–111)
Creatinine, Ser: 0.54 mg/dL (ref 0.44–1.00)
GFR calc Af Amer: >60 mL/min (ref >60)
GFR calc non Af Amer: >60 mL/min (ref >60)
GLUCOSE: 321 mg/dL — AB (ref 65–99)
Potassium: 3.6 mmol/L (ref 3.5–5.1)
Sodium: 136 mmol/L (ref 135–145)

## 2017-10-21 LAB — URINALYSIS, COMPLETE (UACMP) WITH MICROSCOPIC
BACTERIA UA: NONE SEEN
Bilirubin Urine: NEGATIVE
Hgb urine dipstick: NEGATIVE
Ketones, ur: NEGATIVE mg/dL
Nitrite: NEGATIVE
PROTEIN: NEGATIVE mg/dL
Specific Gravity, Urine: 1.029 (ref 1.005–1.030)
pH: 6 (ref 5.0–8.0)

## 2017-10-21 LAB — CBC
HCT: 43.1 % (ref 35.0–47.0)
HEMOGLOBIN: 14.2 g/dL (ref 12.0–16.0)
MCH: 28.3 pg (ref 26.0–34.0)
MCHC: 33 g/dL (ref 32.0–36.0)
MCV: 85.5 fL (ref 80.0–100.0)
Platelets: 153 10*3/uL (ref 150–440)
RBC: 5.04 MIL/uL (ref 3.80–5.20)
RDW: 13.3 % (ref 11.5–14.5)
WBC: 8.5 10*3/uL (ref 3.6–11.0)

## 2017-10-21 LAB — GLUCOSE, CAPILLARY: GLUCOSE-CAPILLARY: 294 mg/dL — AB (ref 65–99)

## 2017-10-21 LAB — TROPONIN I

## 2017-10-21 MED ORDER — SODIUM CHLORIDE 0.9 % IV SOLN: 1000.0000 mL | Freq: Once | INTRAVENOUS | Status: DC

## 2017-10-21 MED ORDER — KETOROLAC TROMETHAMINE 30 MG/ML IJ SOLN
30.0000 mg | Freq: Once | INTRAMUSCULAR | Status: AC
  Administered 2017-10-21: 30 mg via INTRAVENOUS
  Filled 2017-10-21: qty 1

## 2017-10-21 MED ORDER — SODIUM CHLORIDE 0.9 % IV SOLN
1000.0000 mL | Freq: Once | INTRAVENOUS | Status: AC
  Administered 2017-10-21: 1000 mL via INTRAVENOUS

## 2017-10-21 NOTE — ED Triage Notes (Signed)
Pt brought in by [facility] from work.  Pt doesn't remember passing out, but coworkers state that they heard a crash in the back room.  Pt states she just remembers getting hot.  Per EMS, pt c/o headache.  Per EMS, pt's BS was 337.  Pt is A&Ox4, in NAD at this time.

## 2017-10-21 NOTE — ED Provider Notes (Signed)
[facility] Emergency Department Provider Note   ____________________________________________    I have reviewed the triage vital signs and the nursing notes.   HISTORY  Chief Complaint Loss of Consciousness     HPI Karen Lindsey is a 52 y.o. female who presents after a reported syncopal episode.  Patient has a history of diabetes.  Patient reports she was working late at her job and was already feeling fatigued.  She does not remember syncopized and but does not member feeling quite hot prior to the reported event.  She denies injury.  No reports of chest pain or palpitation.  She reports she had a similar episode several weeks ago.  No fevers or chills.  Feels at baseline now.  No dizziness.   Past Medical History:  Diagnosis Date  . Arthritis    rheumatoid  . Diabetes mellitus without complication (HCC)   . Hypertension   . Neuropathy   . RA (rheumatoid arthritis) (HCC)     There are no active problems to display for this patient.   Past Surgical History:  Procedure Laterality Date  . ABDOMINAL HYSTERECTOMY    . CESAREAN SECTION    . CHOLECYSTECTOMY    . KNEE SURGERY    . MANDIBLE FRACTURE SURGERY      Prior to Admission medications   Medication Sig Start Date End Date Taking? Authorizing Provider  amoxicillin (AMOXIL) 500 MG capsule Take 1 capsule (500 mg total) by mouth 3 (three) times daily. Patient not taking: Reported on 10/21/2017 08/06/17   Jeanmarie Plant, MD  baclofen (LIORESAL) 10 MG tablet Take 1 tablet (10 mg total) by mouth 3 (three) times daily. Patient not taking: Reported on 05/09/2017 10/20/15   Kem Boroughs B, FNP  benzonatate (TESSALON PERLES) 100 MG capsule Take 1 capsule (100 mg total) by mouth every 6 (six) hours as needed for cough. 08/06/17 08/06/18  Jeanmarie Plant, MD  Calcium Carb-Ergocalciferol 500-200 MG-UNIT TABS Take 1 tablet by mouth daily.    [provider]  canagliflozin (INVOKANA) 100 MG  TABS tablet Take 100 mg by mouth daily. 02/24/17   [provider]  cyclobenzaprine (FLEXERIL) 5 MG tablet Take 1 tablet (5 mg total) by mouth 3 (three) times daily as needed for muscle spasms. Patient not taking: Reported on 05/09/2017 11/02/16   Menshew, Charlesetta Ivory, PA-C  diclofenac (VOLTAREN) 50 MG EC tablet Take 1 tablet (50 mg total) by mouth 2 (two) times daily. Patient not taking: Reported on 05/09/2017 11/02/16   Menshew, Charlesetta Ivory, PA-C  DOCOSAHEXAENOIC ACID PO Take 1 capsule by mouth daily.    [provider]  famotidine (PEPCID) 20 MG tablet Take 1 tablet (20 mg total) by mouth 2 (two) times daily. 09/15/17 09/15/18  Merrily Brittle, MD  gabapentin (NEURONTIN) 300 MG capsule Take 1,200 mg by mouth 3 (three) times daily.     [provider]  glucosamine-chondroitin 500-400 MG tablet Take 1 tablet by mouth daily.    [provider]  ibuprofen (ADVIL,MOTRIN) 200 MG tablet Take 200 mg by mouth every 6 (six) hours as needed.    [provider]  ibuprofen (ADVIL,MOTRIN) 600 MG tablet Take 1 tablet (600 mg total) every 8 (eight) hours as needed by mouth. 05/24/17   Merrily Brittle, MD  insulin NPH Human (HUMULIN N) 100 UNIT/ML injection Inject 0.4 mLs (40 Units total) into the skin at bedtime. Patient not taking: Reported on 05/09/2017 05/02/16   Bayard Males  N, MD  insulin NPH-regular Human (NOVOLIN 70/30) (70-30) 100 UNIT/ML injection 20-30 units [place] before breakfast and dinner  Check glucose prior to usage Patient taking differently: Inject 32 Units into the skin 2 (two) times daily with a meal.  09/13/16   Schaevitz, Myra Rude, MD  ketorolac (TORADOL) 10 MG tablet Take 1 tablet (10 mg total) by mouth every 8 (eight) hours as needed. Patient not taking: Reported on 05/09/2017 05/12/15   [name], MD  lisinopril-hydrochlorothiazide (PRINZIDE,ZESTORETIC) 10-12.5 MG tablet Take 1 tablet by mouth daily.    [provider]    LORazepam (ATIVAN) 0.5 MG tablet Take 1 tablet (0.5 mg total) by mouth every 8 (eight) hours as needed for anxiety. Patient not taking: Reported on 05/09/2017 03/12/15   Darien Ramus, MD  meloxicam (MOBIC) 15 MG tablet Take 1 tablet (15 mg total) by mouth daily. Patient not taking: Reported on 05/09/2017 10/20/15   Kem Boroughs B, FNP  metFORMIN (GLUCOPHAGE) 500 MG tablet Take 500 mg by mouth every evening.     [provider]  methadone (DOLOPHINE) 5 MG/5ML solution Take 120 mg by mouth daily.     [provider]  Multiple Vitamin (MULTIVITAMIN WITH MINERALS) TABS tablet Take 1 tablet by mouth daily.    [provider]  ondansetron (ZOFRAN) 4 MG tablet Take 1 tablet (4 mg total) by mouth every 8 (eight) hours as needed for nausea or vomiting. Patient not taking: Reported on 05/09/2017 10/27/15   Phineas Semen, MD  ondansetron [facility]) 4 MG tablet Take 1 tablet (4 mg total) by mouth daily as needed for nausea or vomiting. 06/24/17 06/24/18  Willy Eddy, MD  oxyCODONE-acetaminophen (PERCOCET) 7.5-325 MG tablet Take 1 tablet by mouth every 6 (six) hours as needed for severe pain. Patient not taking: Reported on 05/09/2017 12/10/16   Joni Reining, PA-C  promethazine (PHENERGAN) 12.5 MG tablet Take 1 tablet (12.5 mg total) by mouth every 6 (six) hours as needed for nausea or vomiting. Patient not taking: Reported on 05/09/2017 02/15/16   Willy Eddy, MD  vitamin E 400 UNIT capsule Take 400 Units by mouth daily.    [provider]     Allergies Ultram [tramadol hcl]  No family history on file.  Social History Social History   Tobacco Use  . Smoking status: Current Every Day Smoker    Packs/day: 0.50    Types: Cigarettes  . Smokeless tobacco: Never Used  Substance Use Topics  . Alcohol use: No  . Drug use: No    Review of Systems  Constitutional: No fever/chills Eyes: No visual changes.  ENT: No sore throat. Cardiovascular:  Denies chest pain. Respiratory: Denies shortness of breath. Gastrointestinal: No abdominal pain.  No nausea, no vomiting.   Genitourinary: Negative for dysuria. Musculoskeletal: Negative for back pain. Skin: Negative for rash. Neurological: Negative for headaches or weakness   ____________________________________________   PHYSICAL EXAM:  VITAL SIGNS: ED Triage Vitals [10/21/17 1937]  Enc Vitals Group     BP (!) 153/62     Pulse Rate (!) 59     Resp 15     Temp 98 F (36.7 C)     Temp Source Oral     SpO2 97 %     Weight 65.8 kg (145 lb)     Height      Head Circumference      Peak Flow      Pain Score 8     Pain Loc  Pain Edu?      Excl. in GC?     Constitutional: Alert and oriented. No acute distress. Pleasant and interactive Eyes: Conjunctivae are normal.  Head: Atraumatic. Nose: No congestion/rhinnorhea. Mouth/Throat: Mucous membranes are moist.    Cardiovascular: Normal rate, regular rhythm. Grossly normal heart sounds.  Good peripheral circulation. Respiratory: Normal respiratory effort.  No retractions. Lungs CTAB. Gastrointestinal: Soft and nontender. No distention.  Genitourinary: deferred Musculoskeletal: No lower extremity tenderness nor edema.  Warm and well perfused Neurologic:  Normal speech and language. No gross focal neurologic deficits are appreciated.  Skin:  Skin is warm, dry and intact. No rash noted. Psychiatric: Mood and affect are normal. Speech and behavior are normal.  ____________________________________________   LABS (all labs ordered are listed, but only abnormal results are displayed)  Labs Reviewed  BASIC METABOLIC PANEL - Abnormal; Notable for the following components:      Result Value   Glucose, Bld 321 (*)    BUN 21 (*)    Anion gap 4 (*)    All other components within normal limits  URINALYSIS, COMPLETE (UACMP) WITH MICROSCOPIC - Abnormal; Notable for the following components:   Color, Urine STRAW (*)     APPearance CLEAR (*)    Glucose, UA >=500 (*)    Leukocytes, UA TRACE (*)    Squamous Epithelial / LPF 0-5 (*)    All other components within normal limits  GLUCOSE, CAPILLARY - Abnormal; Notable for the following components:   Glucose-Capillary 294 (*)    All other components within normal limits  CBC  TROPONIN I  CBG MONITORING, ED   ____________________________________________  EKG  ED ECG REPORT I, Jene Every, the attending physician, personally viewed and interpreted this ECG.  Date: 10/21/2017  Rhythm: normal sinus rhythm QRS Axis: normal Intervals: normal ST/T Wave abnormalities: normal Narrative Interpretation: no evidence of acute ischemia  ____________________________________________  RADIOLOGY  None ____________________________________________   PROCEDURES  Procedure(s) performed: No  Procedures   Critical Care performed: No ____________________________________________   INITIAL IMPRESSION / ASSESSMENT AND PLAN / ED COURSE  Pertinent labs & imaging results that were available during my care of the patient were reviewed by me and considered in my medical decision making (see chart for details).  Patient well-appearing in no acute distress.  EKG is reassuring.  Lab work unremarkable.  Will treat with IV fluids, check orthostatics and reevaluate.  Patient feeling better after fluids, remains well-appearing.  Orthostatics unremarkable.  Recommended further fluids however the patient wanted to leave.  She will follow-up with PCP.  Return precautions discussed    ____________________________________________   FINAL CLINICAL IMPRESSION(S) / ED DIAGNOSES  Final diagnoses:  Syncope and collapse        Note:  This document was prepared using Dragon voice recognition software and may include unintentional dictation errors.    Jene Every, MD 10/21/17 2337

## 2017-10-21 NOTE — ED Notes (Signed)
Pt discharged to home.  Friend driving.  Discharge instructions reviewed.  Verbalized understanding.  No questions or concerns at this time.  Teach back verified.  Pt in NAD.  No items left in ED.   

## 2017-11-08 ENCOUNTER — Emergency Department: Payer: Self-pay

## 2017-11-08 ENCOUNTER — Observation Stay
Admission: EM | Admit: 2017-11-08 | Discharge: 2017-11-09 | Disposition: A | Discharge status: Home / Self Care | Payer: Self-pay | Attending: Family Medicine | Admitting: Family Medicine

## 2017-11-08 ENCOUNTER — Other Ambulatory Visit: Payer: Self-pay

## 2017-11-08 ENCOUNTER — Encounter: Payer: Self-pay | Admitting: Emergency Medicine

## 2017-11-08 DIAGNOSIS — G8929 Other chronic pain: Secondary | ICD-10-CM | POA: Insufficient documentation

## 2017-11-08 DIAGNOSIS — K760 Fatty (change of) liver, not elsewhere classified: Secondary | ICD-10-CM | POA: Insufficient documentation

## 2017-11-08 DIAGNOSIS — R51 Headache: Secondary | ICD-10-CM | POA: Insufficient documentation

## 2017-11-08 DIAGNOSIS — I10 Essential (primary) hypertension: Secondary | ICD-10-CM | POA: Insufficient documentation

## 2017-11-08 DIAGNOSIS — M069 Rheumatoid arthritis, unspecified: Secondary | ICD-10-CM | POA: Insufficient documentation

## 2017-11-08 DIAGNOSIS — F1120 Opioid dependence, uncomplicated: Secondary | ICD-10-CM | POA: Insufficient documentation

## 2017-11-08 DIAGNOSIS — R531 Weakness: Secondary | ICD-10-CM | POA: Insufficient documentation

## 2017-11-08 DIAGNOSIS — F419 Anxiety disorder, unspecified: Secondary | ICD-10-CM | POA: Insufficient documentation

## 2017-11-08 DIAGNOSIS — Z885 Allergy status to narcotic agent status: Secondary | ICD-10-CM | POA: Insufficient documentation

## 2017-11-08 DIAGNOSIS — R079 Chest pain, unspecified: Secondary | ICD-10-CM | POA: Diagnosis present

## 2017-11-08 DIAGNOSIS — E1142 Type 2 diabetes mellitus with diabetic polyneuropathy: Secondary | ICD-10-CM | POA: Insufficient documentation

## 2017-11-08 DIAGNOSIS — Z794 Long term (current) use of insulin: Secondary | ICD-10-CM | POA: Insufficient documentation

## 2017-11-08 DIAGNOSIS — R0789 Other chest pain: Principal | ICD-10-CM | POA: Insufficient documentation

## 2017-11-08 DIAGNOSIS — R55 Syncope and collapse: Secondary | ICD-10-CM | POA: Insufficient documentation

## 2017-11-08 DIAGNOSIS — Z79899 Other long term (current) drug therapy: Secondary | ICD-10-CM | POA: Insufficient documentation

## 2017-11-08 DIAGNOSIS — N390 Urinary tract infection, site not specified: Secondary | ICD-10-CM | POA: Insufficient documentation

## 2017-11-08 DIAGNOSIS — M549 Dorsalgia, unspecified: Secondary | ICD-10-CM | POA: Insufficient documentation

## 2017-11-08 DIAGNOSIS — E1165 Type 2 diabetes mellitus with hyperglycemia: Secondary | ICD-10-CM | POA: Insufficient documentation

## 2017-11-08 DIAGNOSIS — R4182 Altered mental status, unspecified: Secondary | ICD-10-CM | POA: Insufficient documentation

## 2017-11-08 DIAGNOSIS — F17210 Nicotine dependence, cigarettes, uncomplicated: Secondary | ICD-10-CM | POA: Insufficient documentation

## 2017-11-08 DIAGNOSIS — R1011 Right upper quadrant pain: Secondary | ICD-10-CM | POA: Insufficient documentation

## 2017-11-08 HISTORY — DX: Other chronic pain: G89.29

## 2017-11-08 HISTORY — DX: Type 2 diabetes mellitus with diabetic polyneuropathy: E11.42

## 2017-11-08 HISTORY — DX: Fatty (change of) liver, not elsewhere classified: K76.0

## 2017-11-08 HISTORY — DX: Tobacco use: Z72.0

## 2017-11-08 HISTORY — DX: Type 2 diabetes mellitus without complications: E11.9

## 2017-11-08 HISTORY — DX: Reserved for inherently not codable concepts without codable children: IMO0001

## 2017-11-08 HISTORY — DX: Long term (current) use of insulin: Z79.4

## 2017-11-08 HISTORY — DX: Opioid dependence, uncomplicated: F11.20

## 2017-11-08 LAB — BASIC METABOLIC PANEL
Anion gap: 5 (ref 5–15)
BUN: 22 mg/dL — AB (ref 6–20)
CO2: 26 mmol/L (ref 22–32)
CREATININE: 0.7 mg/dL (ref 0.44–1.00)
Calcium: 8.5 mg/dL — ABNORMAL LOW (ref 8.9–10.3)
Chloride: 106 mmol/L (ref 101–111)
GFR calc Af Amer: >60 mL/min (ref >60)
GFR calc non Af Amer: >60 mL/min (ref >60)
Glucose, Bld: 361 mg/dL — ABNORMAL HIGH (ref 65–99)
Potassium: 4 mmol/L (ref 3.5–5.1)
SODIUM: 137 mmol/L (ref 135–145)

## 2017-11-08 LAB — URINALYSIS, COMPLETE (UACMP) WITH MICROSCOPIC
BILIRUBIN URINE: NEGATIVE
Bacteria, UA: NONE SEEN
KETONES UR: NEGATIVE mg/dL
Nitrite: NEGATIVE
PH: 5 (ref 5.0–8.0)
Protein, ur: NEGATIVE mg/dL
Specific Gravity, Urine: 1.032 — ABNORMAL HIGH (ref 1.005–1.030)
WBC, UA: >50 WBC/hpf — ABNORMAL HIGH (ref 0–5)

## 2017-11-08 LAB — HEPATIC FUNCTION PANEL
ALBUMIN: 3.6 g/dL (ref 3.5–5.0)
ALK PHOS: 82 U/L (ref 38–126)
ALT: 39 U/L (ref 14–54)
AST: 25 U/L (ref 15–41)
BILIRUBIN TOTAL: 0.3 mg/dL (ref 0.3–1.2)
Bilirubin, Direct: <0.1 mg/dL — ABNORMAL LOW (ref 0.1–0.5)
Total Protein: 6.5 g/dL (ref 6.5–8.1)

## 2017-11-08 LAB — CBC
HCT: 41.4 % (ref 35.0–47.0)
Hemoglobin: 13.8 g/dL (ref 12.0–16.0)
MCH: 28.7 pg (ref 26.0–34.0)
MCHC: 33.4 g/dL (ref 32.0–36.0)
MCV: 85.7 fL (ref 80.0–100.0)
Platelets: 174 10*3/uL (ref 150–440)
RBC: 4.83 MIL/uL (ref 3.80–5.20)
RDW: 13.4 % (ref 11.5–14.5)
WBC: 7.8 10*3/uL (ref 3.6–11.0)

## 2017-11-08 LAB — TROPONIN I: Troponin I: <0.03 ng/mL (ref <0.03)

## 2017-11-08 LAB — GLUCOSE, CAPILLARY
GLUCOSE-CAPILLARY: 298 mg/dL — AB (ref 65–99)
Glucose-Capillary: 136 mg/dL — ABNORMAL HIGH (ref 65–99)

## 2017-11-08 MED ORDER — ENOXAPARIN SODIUM 40 MG/0.4ML ~~LOC~~ SOLN
40.0000 mg | SUBCUTANEOUS | Status: DC
  Administered 2017-11-09: 40 mg via SUBCUTANEOUS
  Filled 2017-11-08: qty 0.4

## 2017-11-08 MED ORDER — INSULIN ASPART 100 UNIT/ML ~~LOC~~ SOLN: 0.0000 [IU] | Freq: Three times a day (TID) | SUBCUTANEOUS | Status: DC

## 2017-11-08 MED ORDER — FLUCONAZOLE 50 MG PO TABS
150.0000 mg | ORAL_TABLET | Freq: Every day | ORAL | Status: DC
  Administered 2017-11-09: 150 mg via ORAL
  Filled 2017-11-08: qty 3

## 2017-11-08 MED ORDER — ACETAMINOPHEN 325 MG PO TABS: 650.0000 mg | ORAL_TABLET | Freq: Four times a day (QID) | ORAL | Status: DC | PRN

## 2017-11-08 MED ORDER — SODIUM CHLORIDE 0.9 % IV SOLN
INTRAVENOUS | Status: DC
  Administered 2017-11-09: 01:00:00 via INTRAVENOUS

## 2017-11-08 MED ORDER — INSULIN GLARGINE 100 UNIT/ML ~~LOC~~ SOLN
20.0000 [IU] | Freq: Every day | SUBCUTANEOUS | Status: DC
  Administered 2017-11-09: 20 [IU] via SUBCUTANEOUS
  Filled 2017-11-08 (×2): qty 0.2

## 2017-11-08 MED ORDER — INSULIN ASPART 100 UNIT/ML ~~LOC~~ SOLN: 0.0000 [IU] | Freq: Every day | SUBCUTANEOUS | Status: DC

## 2017-11-08 MED ORDER — CEPHALEXIN 500 MG PO CAPS
500.0000 mg | ORAL_CAPSULE | Freq: Once | ORAL | Status: AC
  Administered 2017-11-08: 500 mg via ORAL
  Filled 2017-11-08: qty 1

## 2017-11-08 MED ORDER — ACETAMINOPHEN 650 MG RE SUPP: 650.0000 mg | Freq: Four times a day (QID) | RECTAL | Status: DC | PRN

## 2017-11-08 MED ORDER — DOCUSATE SODIUM 100 MG PO CAPS: 100.0000 mg | ORAL_CAPSULE | Freq: Two times a day (BID) | ORAL | Status: DC

## 2017-11-08 MED ORDER — ONDANSETRON HCL 4 MG PO TABS: 4.0000 mg | ORAL_TABLET | Freq: Four times a day (QID) | ORAL | Status: DC | PRN

## 2017-11-08 MED ORDER — SODIUM CHLORIDE 0.9 % IV BOLUS
1000.0000 mL | Freq: Once | INTRAVENOUS | Status: AC
  Administered 2017-11-08: 1000 mL via INTRAVENOUS

## 2017-11-08 MED ORDER — ONDANSETRON HCL 4 MG/2ML IJ SOLN: 4.0000 mg | Freq: Four times a day (QID) | INTRAMUSCULAR | Status: DC | PRN

## 2017-11-08 NOTE — ED Provider Notes (Signed)
[facility] Emergency Department Provider Note  ___________________________________________   First MD Initiated Contact with Patient 11/08/17 1809     (approximate)  I have reviewed the triage vital signs and the nursing notes.   HISTORY  Chief Complaint Loss of Consciousness   HPI Karen Lindsey is a 52 y.o. female with a history of rheumatoid arthritis as well as diabetes and hypertension as well as vasovagal syncope who is presenting to the emergency department today after syncopal episode.  She says that she was standing at home when she began to feel a pressure-like chest pain to the left side of her chest that radiated through to her back.  Says that she then passed out.  She was found by bystanders who report that the patient was unconscious for about 5 minutes and then briefly woke up and then became unconscious once again for several minutes.  Patient said that she has continued, mild left-sided chest pain that is still radiating to her back.  Says that she has also had a recent cough.  Denies any burning on urination but says that she has had some right flank pain and history of pyelonephritis.  Patient says that she also has chronic right upper quadrant abdominal pain secondary to chronic liver problems from an enlarged and "fatty" liver.  Says that she has passed out multiple times in the past including one several weeks ago at work.  Says that she is also under considerable stress at home and is supposed to be "moving out today."  Does not report any suicidal or homicidal ideation.  Cough is nonproductive.   Past Medical History:  Diagnosis Date  . Arthritis    rheumatoid  . Diabetes mellitus without complication (HCC)   . Hypertension   . Neuropathy   . RA (rheumatoid arthritis) (HCC)     There are no active problems to display for this patient.   Past Surgical History:  Procedure Laterality Date  . ABDOMINAL HYSTERECTOMY    . CESAREAN  SECTION    . CHOLECYSTECTOMY    . KNEE SURGERY    . MANDIBLE FRACTURE SURGERY      Prior to Admission medications   Medication Sig Start Date End Date Taking? Authorizing Provider  amoxicillin (AMOXIL) 500 MG capsule Take 1 capsule (500 mg total) by mouth 3 (three) times daily. Patient not taking: Reported on 10/21/2017 08/06/17   Jeanmarie Plant, MD  baclofen (LIORESAL) 10 MG tablet Take 1 tablet (10 mg total) by mouth 3 (three) times daily. Patient not taking: Reported on 05/09/2017 10/20/15   Kem Boroughs B, FNP  benzonatate (TESSALON PERLES) 100 MG capsule Take 1 capsule (100 mg total) by mouth every 6 (six) hours as needed for cough. 08/06/17 08/06/18  Jeanmarie Plant, MD  Calcium Carb-Ergocalciferol 500-200 MG-UNIT TABS Take 1 tablet by mouth daily.    [provider]  canagliflozin (INVOKANA) 100 MG TABS tablet Take 100 mg by mouth daily. 02/24/17   [provider]  cyclobenzaprine (FLEXERIL) 5 MG tablet Take 1 tablet (5 mg total) by mouth 3 (three) times daily as needed for muscle spasms. Patient not taking: Reported on 05/09/2017 11/02/16   Menshew, Charlesetta Ivory, PA-C  diclofenac (VOLTAREN) 50 MG EC tablet Take 1 tablet (50 mg total) by mouth 2 (two) times daily. Patient not taking: Reported on 05/09/2017 11/02/16   Menshew, Charlesetta Ivory, PA-C  DOCOSAHEXAENOIC ACID PO Take 1 capsule by mouth daily.    [provider]  famotidine (PEPCID) 20 MG tablet Take 1 tablet (20 mg total) by mouth 2 (two) times daily. 09/15/17 09/15/18  Merrily Brittle, MD  gabapentin (NEURONTIN) 300 MG capsule Take 1,200 mg by mouth 3 (three) times daily.     [provider]  glucosamine-chondroitin 500-400 MG tablet Take 1 tablet by mouth daily.    [provider]  ibuprofen (ADVIL,MOTRIN) 200 MG tablet Take 200 mg by mouth every 6 (six) hours as needed.    [provider]  ibuprofen (ADVIL,MOTRIN) 600 MG tablet Take 1 tablet (600 mg total) every 8 (eight)  hours as needed by mouth. 05/24/17   Merrily Brittle, MD  insulin NPH Human (HUMULIN N) 100 UNIT/ML injection Inject 0.4 mLs (40 Units total) into the skin at bedtime. Patient not taking: Reported on 05/09/2017 05/02/16   [name], MD  insulin NPH-regular Human (NOVOLIN 70/30) (70-30) 100 UNIT/ML injection 20-30 units [place] before breakfast and dinner  Check glucose prior to usage Patient taking differently: Inject 32 Units into the skin 2 (two) times daily with a meal.  09/13/16   Azlin Zilberman, Myra Rude, MD  ketorolac (TORADOL) 10 MG tablet Take 1 tablet (10 mg total) by mouth every 8 (eight) hours as needed. Patient not taking: Reported on 05/09/2017 05/12/15   [name], MD  lisinopril-hydrochlorothiazide (PRINZIDE,ZESTORETIC) 10-12.5 MG tablet Take 1 tablet by mouth daily.    [provider]  LORazepam (ATIVAN) 0.5 MG tablet Take 1 tablet (0.5 mg total) by mouth every 8 (eight) hours as needed for anxiety. Patient not taking: Reported on 05/09/2017 03/12/15   Darien Ramus, MD  meloxicam (MOBIC) 15 MG tablet Take 1 tablet (15 mg total) by mouth daily. Patient not taking: Reported on 05/09/2017 10/20/15   Kem Boroughs B, FNP  metFORMIN (GLUCOPHAGE) 500 MG tablet Take 500 mg by mouth every evening.     [provider]  methadone (DOLOPHINE) 5 MG/5ML solution Take 120 mg by mouth daily.     [provider]  Multiple Vitamin (MULTIVITAMIN WITH MINERALS) TABS tablet Take 1 tablet by mouth daily.    [provider]  ondansetron (ZOFRAN) 4 MG tablet Take 1 tablet (4 mg total) by mouth every 8 (eight) hours as needed for nausea or vomiting. Patient not taking: Reported on 05/09/2017 10/27/15   Phineas Semen, MD  ondansetron [facility]) 4 MG tablet Take 1 tablet (4 mg total) by mouth daily as needed for nausea or vomiting. 06/24/17 06/24/18  Willy Eddy, MD  oxyCODONE-acetaminophen (PERCOCET) 7.5-325 MG tablet Take 1 tablet by mouth every 6  (six) hours as needed for severe pain. Patient not taking: Reported on 05/09/2017 12/10/16   Joni Reining, PA-C  promethazine (PHENERGAN) 12.5 MG tablet Take 1 tablet (12.5 mg total) by mouth every 6 (six) hours as needed for nausea or vomiting. Patient not taking: Reported on 05/09/2017 02/15/16   Willy Eddy, MD  vitamin E 400 UNIT capsule Take 400 Units by mouth daily.    [provider]    Allergies Ultram [tramadol hcl]  History reviewed. No pertinent family history.  Social History Social History   Tobacco Use  . Smoking status: Current Every Day Smoker    Packs/day: 0.50    Types: Cigarettes  . Smokeless tobacco: Never Used  Substance Use Topics  . Alcohol use: No  . Drug use: No    Review of Systems  Constitutional: No fever/chills Eyes: No visual changes. ENT: No sore throat. Cardiovascular: As  above Respiratory: Denies shortness of breath. Gastrointestinal:   No nausea, no vomiting.  No diarrhea.  No constipation. Genitourinary: Negative for dysuria. Musculoskeletal: As above Skin: Negative for rash. Neurological: Negative for headaches, focal weakness or numbness.   ____________________________________________   PHYSICAL EXAM:  VITAL SIGNS: ED Triage Vitals  Enc Vitals Group     BP 11/08/17 1518 (!) 150/64     Pulse Rate 11/08/17 1518 66     Resp 11/08/17 1518 18     Temp 11/08/17 1518 98.1 F (36.7 C)     Temp Source 11/08/17 1518 Oral     SpO2 11/08/17 1518 94 %     Weight 11/08/17 1519 145 lb (65.8 kg)     Height 11/08/17 1519 5\' 1"  (1.549 m)     Head Circumference --      Peak Flow --      Pain Score 11/08/17 1519 8     Pain Loc --      Pain Edu? --      Excl. in GC? --     Constitutional: Alert and oriented. Well appearing and in no acute distress. Eyes: Conjunctivae are normal.  Head: Atraumatic. Nose: No congestion/rhinnorhea. Mouth/Throat: Mucous membranes are moist.  Neck: No stridor.   Cardiovascular: Normal  rate, regular rhythm. Grossly normal heart sounds.  Good peripheral circulation with equal and bilateral radial as well as dorsalis pedis pulses.  Chest pain with mild reproducibility to the left pectoralis major muscle.  No reproducible tenderness to the back. Respiratory: Normal respiratory effort.  No retractions. Lungs CTAB. Gastrointestinal: Soft with mild right upper quadrant tenderness with a negative Murphy sign.  No distention. No CVA tenderness. Musculoskeletal: No lower extremity tenderness nor edema.  No joint effusions. Neurologic:  Normal speech and language. No gross focal neurologic deficits are appreciated. Skin:  Skin is warm, dry and intact. No rash noted. Psychiatric: Mood and affect are normal. Speech and behavior are normal.  ____________________________________________   LABS (all labs ordered are listed, but only abnormal results are displayed)  Labs Reviewed  GLUCOSE, CAPILLARY - Abnormal; Notable for the following components:      Result Value   Glucose-Capillary 298 (*)    All other components within normal limits  BASIC METABOLIC PANEL - Abnormal; Notable for the following components:   Glucose, Bld 361 (*)    BUN 22 (*)    Calcium 8.5 (*)    All other components within normal limits  URINALYSIS, COMPLETE (UACMP) WITH MICROSCOPIC - Abnormal; Notable for the following components:   Color, Urine YELLOW (*)    APPearance CLOUDY (*)    Specific Gravity, Urine 1.032 (*)    Glucose, UA >=500 (*)    Hgb urine dipstick SMALL (*)    Leukocytes, UA LARGE (*)    WBC, UA >50 (*)    All other components within normal limits  HEPATIC FUNCTION PANEL - Abnormal; Notable for the following components:   Bilirubin, Direct <0.1 (*)    All other components within normal limits  URINE CULTURE  CBC  TROPONIN I  TROPONIN I  CBG MONITORING, ED   ____________________________________________  EKG  ED ECG REPORT I, Arelia Longest, the attending physician, personally  viewed and interpreted this ECG.   Date: 11/08/2017  EKG Time: 1525  Rate: 63  Rhythm: normal sinus rhythm  Axis: Normal  Intervals:none  ST&T Change: No ST segment elevation or depression.  No abnormal T wave inversion.  ____________________________________________  RADIOLOGY  No  acute finding on head CT nor the chest x-ray ____________________________________________   PROCEDURES  Procedure(s) performed:   Procedures  Critical Care performed:   ____________________________________________   INITIAL IMPRESSION / ASSESSMENT AND PLAN / ED COURSE  Pertinent labs & imaging results that were available during my care of the patient were reviewed by me and considered in my medical decision making (see chart for details).  DDX: Vasovagal syncope, chest wall pain, ACS, arrhythmia, aortic dissection, PE, urinary tract infection, liver failure, kidney failure, stress, anxiety As part of my medical decision making, I reviewed the following data within the electronic MEDICAL RECORD NUMBER Notes from prior ED visits  ----------------------------------------- 9:01 PM on 11/08/2017 -----------------------------------------  Patient at this time still feels weak with right-sided flank pain.  However, is not planning of chest pain at this time.  I discussed discharge with the patient and the patient does not feel comfortable going home.  She is concerned that she will pass out again.  She is also saying that she is likely not able to follow-up with cardiology due to financial reasons.  Because of her repeated syncope as well as intermittent repetitive chest pain she will be admitted to the hospital for further monitoring and work-up.  Signed out to Dr. Sheryle Hail.  Patient and family are understanding of the treatment plan as well as diagnosis and willing to comply. ____________________________________________   FINAL CLINICAL IMPRESSION(S) / ED DIAGNOSES  UTI.  Chest pain.  Syncope.    NEW  MEDICATIONS STARTED DURING THIS VISIT:  New Prescriptions   No medications on file     Note:  This document was prepared using Dragon voice recognition software and may include unintentional dictation errors.     Myrna Blazer, MD 11/08/17 2101

## 2017-11-08 NOTE — ED Notes (Signed)
EMS , syncope, chest pain , EKG normal sinus, IV to left arm,  CBG 400's

## 2017-11-08 NOTE — ED Triage Notes (Signed)
Pt to ED via [facility] with complaints of syncope. She reports that she keeps getting headaches. She states that she is suppose to be moving out and doesn't know where she going to go. She is complaining of chest pains, pt is tearful.

## 2017-11-08 NOTE — ED Notes (Signed)
Patient stated she felt weak and just hurts all over. Vitals stable.

## 2017-11-08 NOTE — ED Notes (Signed)
Patient was helped up to restroom

## 2017-11-09 ENCOUNTER — Encounter: Payer: Self-pay | Admitting: Nurse Practitioner

## 2017-11-09 ENCOUNTER — Observation Stay: Admit: 2017-11-09 | Payer: Self-pay

## 2017-11-09 ENCOUNTER — Telehealth: Payer: Self-pay

## 2017-11-09 DIAGNOSIS — R079 Chest pain, unspecified: Secondary | ICD-10-CM

## 2017-11-09 LAB — HEMOGLOBIN A1C
Hgb A1c MFr Bld: 11.4 % — ABNORMAL HIGH (ref 4.8–5.6)
Mean Plasma Glucose: 280.48 mg/dL

## 2017-11-09 LAB — URINE DRUG SCREEN, QUALITATIVE (ARMC ONLY)
Amphetamines, Ur Screen: NOT DETECTED
Barbiturates, Ur Screen: NOT DETECTED
Benzodiazepine, Ur Scrn: NOT DETECTED
Cannabinoid 50 Ng, Ur ~~LOC~~: NOT DETECTED
Cocaine Metabolite,Ur ~~LOC~~: NOT DETECTED
MDMA (Ecstasy)Ur Screen: NOT DETECTED
Methadone Scn, Ur: POSITIVE — AB
Opiate, Ur Screen: NOT DETECTED
Phencyclidine (PCP) Ur S: NOT DETECTED
Tricyclic, Ur Screen: NOT DETECTED

## 2017-11-09 LAB — TROPONIN I
Troponin I: <0.03 ng/mL (ref <0.03)
Troponin I: <0.03 ng/mL (ref <0.03)
Troponin I: <0.03 ng/mL (ref <0.03)

## 2017-11-09 LAB — GLUCOSE, CAPILLARY
Glucose-Capillary: 127 mg/dL — ABNORMAL HIGH (ref 65–99)
Glucose-Capillary: 263 mg/dL — ABNORMAL HIGH (ref 65–99)

## 2017-11-09 LAB — URINE CULTURE

## 2017-11-09 LAB — TSH: TSH: 0.747 u[IU]/mL (ref 0.350–4.500)

## 2017-11-09 MED ORDER — ADULT MULTIVITAMIN W/MINERALS CH: 1.0000 | ORAL_TABLET | Freq: Every day | ORAL | Status: DC

## 2017-11-09 MED ORDER — CEPHALEXIN 500 MG PO CAPS: 500.0000 mg | ORAL_CAPSULE | Freq: Two times a day (BID) | ORAL | 0 refills | Status: DC

## 2017-11-09 MED ORDER — LIVING WELL WITH DIABETES BOOK
Freq: Once | Status: AC
  Administered 2017-11-09: 13:00:00
  Filled 2017-11-09: qty 1

## 2017-11-09 MED ORDER — CEPHALEXIN 500 MG PO CAPS
500.0000 mg | ORAL_CAPSULE | Freq: Two times a day (BID) | ORAL | Status: DC
  Administered 2017-11-09: 500 mg via ORAL
  Filled 2017-11-09: qty 1

## 2017-11-09 MED ORDER — PREMIER PROTEIN SHAKE: 11.0000 [oz_av] | Freq: Two times a day (BID) | ORAL | Status: DC

## 2017-11-09 MED ORDER — GABAPENTIN 600 MG PO TABS
1200.0000 mg | ORAL_TABLET | Freq: Three times a day (TID) | ORAL | Status: DC
  Administered 2017-11-09: 1200 mg via ORAL
  Filled 2017-11-09: qty 2

## 2017-11-09 MED ORDER — FLUCONAZOLE 150 MG PO TABS: 150.0000 mg | ORAL_TABLET | Freq: Every day | ORAL | 0 refills | Status: DC

## 2017-11-09 NOTE — Progress Notes (Signed)
Nutrition Education Note   RD consulted for nutrition education regarding diabetes.   Lab Results  Component Value Date   HGBA1C 11.4 (H) 11/08/2017    RD provided "Nutrition and Type II Diabetes" handout from the [employer]. Discussed different food groups and their effects on blood sugar, emphasizing carbohydrate-containing foods. Provided list of carbohydrates and recommended serving sizes of common foods.  Discussed importance of controlled and consistent carbohydrate intake throughout the day. Provided examples of ways to balance meals/snacks and encouraged intake of high-fiber, whole grain complex carbohydrates. Teach back method used.  Expect fair compliance.  Body mass index is 25.83 kg/m. Pt meets criteria for overweight based on current BMI.  Current diet order is CHO modified, patient is consuming approximately 100% of meals at this time. Labs and medications reviewed. No further nutrition interventions warranted at this time. RD contact information provided. If additional nutrition issues arise, please re-consult RD.  Betsey Holiday MS, RD, LDN Pager #- [phone] After Hours Pager: [phone]

## 2017-11-09 NOTE — Progress Notes (Signed)
Patient very upset she hasn't had her methadone.  She is wanting to be discharged.  Per MD she is going home.  Awaiting discharge information.

## 2017-11-09 NOTE — Telephone Encounter (Signed)
Left voicemail message to call back  

## 2017-11-09 NOTE — Discharge Summary (Signed)
[facility] - [place] at [facility]   PATIENT NAME: Karen Lindsey    MR#:  [id number]  DATE OF BIRTH:  1965/11/16  DATE OF ADMISSION:  11/08/2017 ADMITTING PHYSICIAN: Arnaldo Natal, MD  DATE OF DISCHARGE: No discharge date for patient encounter.  PRIMARY CARE PHYSICIAN: Center, [facility]    ADMISSION DIAGNOSIS:  Syncope and collapse [R55] Urinary tract infection without hematuria, site unspecified [N39.0] Chest pain, unspecified type [R07.9]  DISCHARGE DIAGNOSIS:  Active Problems:   Chest pain   SECONDARY DIAGNOSIS:   Past Medical History:  Diagnosis Date  . Arthritis    rheumatoid  . Chronic pain    a. upper/mid back.  . Diabetic peripheral neuropathy (HCC)   . Fatty liver   . Hypertension   . Insulin dependent diabetes mellitus (HCC)    a. Dx ~ 2011.  . Narcotic addiction (HCC)    a. Followed in substance abuse center in [place] - on chronic methadone.  . RA (rheumatoid arthritis) (HCC)   . Tobacco abuse     HOSPITAL COURSE:  *Acute atypical chest pain  Resolved  Refer to observation, cardiac enzymes negative, per nursing staff-Case discussed with cardiology/Dr. Arida-patient okay for discharge with follow-up with an outpatient with cardiology for reevaluation   *Acute syncope/altered mental status  Most likely secondary to polypharmacy-patient disputes this Patient follow with primary care provider in 1 to 2 days for reevaluation  *Acute possible UTI Resolving Started on Keflex, to follow-up with primary care provider in 1 to 2 days for follow-up on urine culture  *Chronic diabetes mellitus type 2 Stable on current regiment  DISCHARGE CONDITIONS:  Stable, follow-up with cardiology/primary care provider as directed, for more specific details please see chart  CONSULTS OBTAINED:  Treatment Team:  Iran Ouch, MD  DRUG ALLERGIES:   Allergies  Allergen Reactions  . Ultram [Tramadol Hcl] Palpitations     DISCHARGE MEDICATIONS:   Allergies as of 11/09/2017      Reactions   Ultram [tramadol Hcl] Palpitations      Medication List    STOP taking these medications   amoxicillin 500 MG capsule Commonly known as:  AMOXIL     TAKE these medications   baclofen 10 MG tablet Commonly known as:  LIORESAL Take 1 tablet (10 mg total) by mouth 3 (three) times daily.   benzonatate 100 MG capsule Commonly known as:  TESSALON PERLES Take 1 capsule (100 mg total) by mouth every 6 (six) hours as needed for cough.   Calcium Carb-Ergocalciferol 500-200 MG-UNIT Tabs Take 1 tablet by mouth daily.   canagliflozin 100 MG Tabs tablet Commonly known as:  INVOKANA Take 100 mg by mouth daily.   cephALEXin 500 MG capsule Commonly known as:  KEFLEX Take 1 capsule (500 mg total) by mouth every 12 (twelve) hours.   cyclobenzaprine 5 MG tablet Commonly known as:  FLEXERIL Take 1 tablet (5 mg total) by mouth 3 (three) times daily as needed for muscle spasms.   diclofenac 50 MG EC tablet Commonly known as:  VOLTAREN Take 1 tablet (50 mg total) by mouth 2 (two) times daily.   DOCOSAHEXAENOIC ACID PO Take 1 capsule by mouth daily.   famotidine 20 MG tablet Commonly known as:  PEPCID Take 1 tablet (20 mg total) by mouth 2 (two) times daily.   fluconazole 150 MG tablet Commonly known as:  DIFLUCAN Take 1 tablet (150 mg total) by mouth daily. Start taking on:  11/10/2017  gabapentin 300 MG capsule Commonly known as:  NEURONTIN Take 1,200 mg by mouth 3 (three) times daily.   glucosamine-chondroitin 500-400 MG tablet Take 1 tablet by mouth daily.   ibuprofen 200 MG tablet Commonly known as:  ADVIL,MOTRIN Take 200 mg by mouth every 6 (six) hours as needed.   ibuprofen 600 MG tablet Commonly known as:  ADVIL,MOTRIN Take 1 tablet (600 mg total) every 8 (eight) hours as needed by mouth.   insulin NPH Human 100 UNIT/ML injection Commonly known as:  HUMULIN N Inject 0.4 mLs (40 Units total)  into the skin at bedtime.   insulin NPH-regular Human (70-30) 100 UNIT/ML injection Commonly known as:  NOVOLIN 70/30 20-30 units  before breakfast and dinner  Check glucose prior to usage What changed:    how much to take  how to take this  when to take this  additional instructions   ketorolac 10 MG tablet Commonly known as:  TORADOL Take 1 tablet (10 mg total) by mouth every 8 (eight) hours as needed.   lisinopril-hydrochlorothiazide 10-12.5 MG tablet Commonly known as:  PRINZIDE,ZESTORETIC Take 1 tablet by mouth daily.   LORazepam 0.5 MG tablet Commonly known as:  ATIVAN Take 1 tablet (0.5 mg total) by mouth every 8 (eight) hours as needed for anxiety.   meloxicam 15 MG tablet Commonly known as:  MOBIC Take 1 tablet (15 mg total) by mouth daily.   metFORMIN 500 MG tablet Commonly known as:  GLUCOPHAGE Take 500 mg by mouth every evening.   methadone 1 MG/1ML solution Commonly known as:  DOLOPHINE Take 120 mg by mouth daily.   multivitamin with minerals Tabs tablet Take 1 tablet by mouth daily.   ondansetron 4 MG tablet Commonly known as:  ZOFRAN Take 1 tablet (4 mg total) by mouth every 8 (eight) hours as needed for nausea or vomiting.   ondansetron 4 MG tablet Commonly known as:  ZOFRAN Take 1 tablet (4 mg total) by mouth daily as needed for nausea or vomiting.   oxyCODONE-acetaminophen 7.5-325 MG tablet Commonly known as:  PERCOCET Take 1 tablet by mouth every 6 (six) hours as needed for severe pain.   promethazine 12.5 MG tablet Commonly known as:  PHENERGAN Take 1 tablet (12.5 mg total) by mouth every 6 (six) hours as needed for nausea or vomiting.   vitamin E 400 UNIT capsule Take 400 Units by mouth daily.        DISCHARGE INSTRUCTIONS:  If you experience worsening of your admission symptoms, develop shortness of breath, life threatening emergency, suicidal or homicidal thoughts you must seek medical attention immediately by calling 911 or  calling your MD immediately  if symptoms less severe.  You Must read complete instructions/literature along with all the possible adverse reactions/side effects for all the Medicines you take and that have been prescribed to you. Take any new Medicines after you have completely understood and accept all the possible adverse reactions/side effects.   Please note  You were cared for by a hospitalist during your hospital stay. If you have any questions about your discharge medications or the care you received while you were in the hospital after you are discharged, you can call the unit and asked to speak with the hospitalist on call if the hospitalist that took care of you is not available. Once you are discharged, your primary care physician will handle any further medical issues. Please note that NO REFILLS for any discharge medications will be authorized once you are discharged,  as it is imperative that you return to your primary care physician (or establish a relationship with a primary care physician if you do not have one) for your aftercare needs so that they can reassess your need for medications and monitor your lab values.    Today   CHIEF COMPLAINT:   Chief Complaint  Patient presents with  . Loss of Consciousness    HISTORY OF PRESENT ILLNESS:  The patient with past medical history of hypertension, diabetes with neuropathy, and rheumatoid arthritis presents to the emergency department via EMS after an episode of losing consciousness.  According to family, the patient may or may not have had respiratory difficulty up to and including respiratory arrest.  EMS did not observe any respiratory failure or distress upon arrival.  In the emergency department the patient had intermittent chest pain but no indication of myocardial ischemia. Two sets of troponin were negative but she continued to have vague intermittent episodes of chest pain which prompted the emergency department staff to call  the hospitalist service for admission.       VITAL SIGNS:  Blood pressure 138/81, pulse 63, temperature 98.2 F (36.8 C), temperature source Oral, resp. rate 14, height 5\' 1"  (1.549 m), weight 62 kg (136 lb 11.2 oz), SpO2 96 %.  I/O:    Intake/Output Summary (Last 24 hours) at 11/09/2017 1223 Last data filed at 11/09/2017 1030 Gross per 24 hour  Intake 1511.67 ml  Output 300 ml  Net 1211.67 ml    PHYSICAL EXAMINATION:  GENERAL:  52 y.o.-year-old patient lying in the bed with no acute distress.  EYES: Pupils equal, round, reactive to light and accommodation. No scleral icterus. Extraocular muscles intact.  HEENT: Head atraumatic, normocephalic. Oropharynx and nasopharynx clear.  NECK:  Supple, no jugular venous distention. No thyroid enlargement, no tenderness.  LUNGS: Normal breath sounds bilaterally, no wheezing, rales,rhonchi or crepitation. No use of accessory muscles of respiration.  CARDIOVASCULAR: S1, S2 normal. No murmurs, rubs, or gallops.  ABDOMEN: Soft, non-tender, non-distended. Bowel sounds present. No organomegaly or mass.  EXTREMITIES: No pedal edema, cyanosis, or clubbing.  NEUROLOGIC: Cranial nerves II through XII are intact. Muscle strength 5/5 in all extremities. Sensation intact. Gait not checked.  PSYCHIATRIC: The patient is alert and oriented x 3.  SKIN: No obvious rash, lesion, or ulcer.   DATA REVIEW:   CBC Recent Labs  Lab 11/08/17 1521  WBC 7.8  HGB 13.8  HCT 41.4  PLT 174    Chemistries  Recent Labs  Lab 11/08/17 1521  NA 137  K 4.0  CL 106  CO2 26  GLUCOSE 361*  BUN 22*  CREATININE 0.70  CALCIUM 8.5*  AST 25  ALT 39  ALKPHOS 82  BILITOT 0.3    Cardiac Enzymes Recent Labs  Lab 11/09/17 0953  TROPONINI <0.03    Microbiology Results  Results for orders placed or performed during the hospital encounter of 07/21/16  Rapid Influenza A&B Antigens ([facility] only)     Status: None   Collection Time: 07/21/16  5:51 AM  Result Value  Ref Range Status   Influenza A ([facility]) NEGATIVE NEGATIVE Final   Influenza B [facility]) NEGATIVE NEGATIVE Final    RADIOLOGY:  Dg Chest 1 View  Result Date: 11/08/2017 CLINICAL DATA:  Chest pain and weakness. EXAM: CHEST  1 VIEW COMPARISON:  09/15/2017 FINDINGS: The heart size and mediastinal contours are within normal limits. Both lungs are clear. The visualized skeletal structures are unremarkable. IMPRESSION: No active disease.  Electronically Signed   By: Myles Rosenthal M.D.   On: 11/08/2017 18:50   Ct Head Wo Contrast  Result Date: 11/08/2017 CLINICAL DATA:  Syncope, headaches EXAM: CT HEAD WITHOUT CONTRAST TECHNIQUE: Contiguous axial images were obtained from the base of the skull through the vertex without intravenous contrast. COMPARISON:  06/24/2017 FINDINGS: Brain: No evidence of acute infarction, hemorrhage, hydrocephalus, extra-axial collection or mass lesion/mass effect. Vascular: No hyperdense vessel or unexpected calcification. Skull: Normal. Negative for fracture or focal lesion. Sinuses/Orbits: The visualized paranasal sinuses are essentially clear. The mastoid air cells are unopacified. Other: None. IMPRESSION: Normal head CT. Electronically Signed   By: Charline Bills M.D.   On: 11/08/2017 18:52    EKG:   Orders placed or performed during the hospital encounter of 11/08/17  . ED EKG  . ED EKG  . EKG 12-Lead  . EKG 12-Lead      Management plans discussed with the patient, family and they are in agreement.  CODE STATUS:     Code Status Orders  (From admission, onward)        Start     Ordered   11/08/17 2205  Full code  Continuous     11/08/17 2204    Code Status History    This patient has a current code status but no historical code status.      TOTAL TIME TAKING CARE OF THIS PATIENT: 45 minutes.    Evelena Asa Salary M.D on 11/09/2017 at 12:23 PM  Between 7am to 6pm - Pager - [phone]  After 6pm go to [email or web address] - Social research officer, government  [place]  [facility]  Office  [phone]  CC: Primary care physician; [facility]   Note: This dictation was prepared with Nurse, children's dictation along with smaller phrase technology. Any transcriptional errors that result from this process are unintentional.

## 2017-11-09 NOTE — Progress Notes (Signed)
Inpatient Diabetes Program Recommendations  AACE/ADA: New Consensus Statement on Inpatient Glycemic Control (2015)  Target Ranges:  Prepandial:   less than 140 mg/dL      Peak postprandial:   less than 180 mg/dL (1-2 hours)      Critically ill patients:  140 - 180 mg/dL   Lab Results  Component Value Date   GLUCAP 127 (H) 11/09/2017   HGBA1C 11.4 (H) 11/08/2017    Review of Glycemic Control Results for Karen Lindsey, Karen Lindsey (MRN [id number]) as of 11/09/2017 09:18  Ref. Range 11/08/2017 15:20 11/08/2017 22:09 11/09/2017 08:33  Glucose-Capillary Latest Ref Range: 65 - 99 mg/dL 124 (H) 580 (H) 998 (H)   Diabetes history: DM2 Outpatient Diabetes medications: Humulin 70/30 32 units bid + Invokana 100 mg qd + Metformin 500 mg qd (patient states not taking due to upset stomach) Current orders for Inpatient glycemic control: Lantus 20 units + Novolog moderate correction tid + hs  Inpatient Diabetes Program Recommendations:   Spoke with pt by phone (DM coordinator on [facility] campus this am )about A1C results 11.4 with them and explained what an A1C is, basic pathophysiology of DM Type 2, basic home care, basic diabetes diet nutrition principles, importance of checking CBGs and maintaining good CBG control to prevent long-term and short-term complications. Reviewed signs and symptoms of hyperglycemia and hypoglycemia and how to treat hypoglycemia at home. Also reviewed blood sugar goals at home.  RNs to provide ongoing basic DM education at bedside with this patient. Have ordered educational booklet. Patient receives her insulin by mail free of charge. Patient currently is very upset due to having to move to another place and not able to find her glucose meter. Gave patient information regarding Relion meter and strips @ [employer] for $25 for meter + 100 strips. Patient sees endocrinology and saw Dr. Boykin Reaper on 04/28/17.  Thank you, Karen Lindsey. Karen Karg, RN, MSN, CDE  Diabetes Coordinator Inpatient Glycemic Control  Team Team Pager [phone] (8am-5pm) 11/09/2017 9:39 AM

## 2017-11-09 NOTE — Progress Notes (Signed)
Patient requesting her methadone.  She is very upset this has not been added to her medication list.  Paged Dr. Katheren Shams

## 2017-11-09 NOTE — Consult Note (Signed)
Cardiology Consult    Patient ID: Karen Lindsey MRN: [id number], DOB/AGE: July 21, 1965   Admit date: 11/08/2017 Date of Consult: 11/09/2017  Primary Physician: [facility] Primary Cardiologist: Lorine Bears, MD - New Requesting Provider: Jennette Kettle, MD  Patient Profile    Karen Lindsey is a 52 y.o. female with a history of HTN, DM, chronic pain, peripheral neuropathy, narcotic addiction, and tobacco abuse who is being seen today for the evaluation of syncope and chest pain at the request of Dr. Katheren Shams.  Past Medical History   Past Medical History:  Diagnosis Date  . Arthritis    rheumatoid  . Chronic pain    a. upper/mid back.  . Diabetic peripheral neuropathy (HCC)   . Fatty liver   . Hypertension   . Insulin dependent diabetes mellitus (HCC)    a. Dx ~ 2011.  . Narcotic addiction (HCC)    a. Followed in substance abuse center in [place] - on chronic methadone.  . RA (rheumatoid arthritis) (HCC)   . Tobacco abuse     Past Surgical History:  Procedure Laterality Date  . ABDOMINAL HYSTERECTOMY    . CESAREAN SECTION    . CHOLECYSTECTOMY    . KNEE SURGERY    . MANDIBLE FRACTURE SURGERY       Allergies  Allergies  Allergen Reactions  . Ultram [Tramadol Hcl] Palpitations    History of Present Illness    52 y/o ? with the above PMH including DM, diabetic peripheral neuropathy, chronic pain, tob abuse, narcotic addiction, and HTN.  She has no prior cardiac hx.  She was dx w/ diabetes in ~ 2011, and is followed @ [facility].  She has no prior cardiac hx.  She is on chronic methadone therapy in the setting of previous narcotic abuse (in the setting of pain related to neuropathy), and is followed @ a substance abuse center in [place].  She says that over the past 2 years, she has had progressively more frequent and worsening mid to upper back pain that is often associated with pleuritic chest pain.  This has been occurring multiple times/wk over the past few months  and multiple times/day over the past week.  She takes advil for this.  She also reports a h/o syncope, which has occurred 4x in the past few months.  Spells occur while standing/walking, and are typically preceded by lightheadedness and/or nausea.  She has not sustained any injuries/trauma.  She is unsure as to how long spells typically last.  She has been having a lot of life stress related to her living and work situation.  In that setting, symptoms of back and chest pain, as well as right upper quadrant pain have seemed to increase.  On May 1, she was walking in her home and suddenly became lightheaded and nauseated.  Her vision greyed and she lost consciousness, falling to the floor.  A roommate attended to her immediately and poured water on her face.  She believes she regained consciousness within a minute or so.  Upon regaining consciousness, she continued to feel nauseated.  EMS was called and upon their arrival, she nearly vomited after being stood up.  She was transported to the [facility] emergency department where ECG was nonacute.  Troponin was normal.  Vital signs stable.  She reported that she was having back and pleuritic chest pain.  She was admitted for further evaluation and cardiology consulted.  She currently denies any chest pain.  Inpatient Medications    .  cephALEXin  500 mg Oral Q12H  . docusate sodium  100 mg Oral BID  . enoxaparin (LOVENOX) injection  40 mg Subcutaneous Q24H  . fluconazole  150 mg Oral Daily  . insulin aspart  0-15 Units Subcutaneous TID WC  . insulin aspart  0-5 Units Subcutaneous QHS  . insulin glargine  20 Units Subcutaneous QHS  . living well with diabetes book   Does not apply Once    Family History    History reviewed. No pertinent family history. indicated that her mother is deceased. She indicated that her father is deceased. No premature coronary artery disease.  Social History    Social History   Socioeconomic History  . Marital status:  Legally Separated    Spouse name: Not on file  . Number of children: Not on file  . Years of education: Not on file  . Highest education level: Not on file  Occupational History    Comment: Works part-time in Physiological scientist  Social Needs  . Financial resource strain: Not on file  . Food insecurity:    Worry: Not on file    Inability: Not on file  . Transportation needs:    Medical: Not on file    Non-medical: Not on file  Tobacco Use  . Smoking status: Current Every Day Smoker    Packs/day: 0.50    Types: Cigarettes  . Smokeless tobacco: Never Used  . Tobacco comment: 30+ years  Substance and Sexual Activity  . Alcohol use: No    Comment: Hasn't had a drink since 1998  . Drug use: No    Comment: Previously abused pain pills.  . Sexual activity: Not on file  Lifestyle  . Physical activity:    Days per week: Not on file    Minutes per session: Not on file  . Stress: Not on file  Relationships  . Social connections:    Talks on phone: Not on file    Gets together: Not on file    Attends religious service: Not on file    Active member of club or organization: Not on file    Attends meetings of clubs or organizations: Not on file    Relationship status: Not on file  . Intimate partner violence:    Fear of current or ex partner: Not on file    Emotionally abused: Not on file    Physically abused: Not on file    Forced sexual activity: Not on file  Other Topics Concern  . Not on file  Social History Narrative   Lives locally with friends/roomates.     Review of Systems    General:  +++ malaise. No chills, fever, night sweats or weight changes.  Cardiovascular:  +++ pleuritic chest pain, no dyspnea on exertion, edema, orthopnea, palpitations, paroxysmal nocturnal dyspnea. +++ presyncope/syncope.  1. Dermatological: No rash, lesions/masses Respiratory: +++ cough, no dyspnea Urologic: No hematuria, dysuria Abdominal:   +++ nausea in setting of LH/presyncope on  admission.  No vomiting, diarrhea, bright red blood per rectum, melena, or hematemesis Neurologic:  +++ visual changes in setting of presyncope, +++ wkns, changes in mental status. All other systems reviewed and are otherwise negative except as noted above.  Physical Exam    Blood pressure 138/81, pulse 63, temperature 98.2 F (36.8 C), temperature source Oral, resp. rate 14, height 5\' 1"  (1.549 m), weight 136 lb 11.2 oz (62 kg), SpO2 96 %.  General: Pleasant, NAD Psych: Normal affect. Neuro: Alert and  oriented X 3. Moves all extremities spontaneously. HEENT: Normal  Neck: Supple without bruits or JVD. Lungs:  Resp regular and unlabored, CTA. Heart: RRR no s3, s4, or murmurs. Abdomen: Soft, non-tender, non-distended, BS + x 4.  Extremities: No clubbing, cyanosis or edema. DP/PT/Radials 2+ and equal bilaterally.  Labs    Recent Labs    11/08/17 1521 11/08/17 1955 11/08/17 2239 11/09/17 0409  TROPONINI <0.03 <0.03 <0.03 <0.03   Lab Results  Component Value Date   WBC 7.8 11/08/2017   HGB 13.8 11/08/2017   HCT 41.4 11/08/2017   MCV 85.7 11/08/2017   PLT 174 11/08/2017    Recent Labs  Lab 11/08/17 1521  NA 137  K 4.0  CL 106  CO2 26  BUN 22*  CREATININE 0.70  CALCIUM 8.5*  PROT 6.5  BILITOT 0.3  ALKPHOS 82  ALT 39  AST 25  GLUCOSE 361*     Radiology Studies    Dg Chest 1 View  Result Date: 11/08/2017 CLINICAL DATA:  Chest pain and weakness. EXAM: CHEST  1 VIEW COMPARISON:  09/15/2017 FINDINGS: The heart size and mediastinal contours are within normal limits. Both lungs are clear. The visualized skeletal structures are unremarkable. IMPRESSION: No active disease. Electronically Signed   By: Myles Rosenthal M.D.   On: 11/08/2017 18:50   Ct Head Wo Contrast  Result Date: 11/08/2017 CLINICAL DATA:  Syncope, headaches EXAM: CT HEAD WITHOUT CONTRAST TECHNIQUE: Contiguous axial images were obtained from the base of the skull through the vertex without intravenous  contrast. COMPARISON:  06/24/2017 FINDINGS: Brain: No evidence of acute infarction, hemorrhage, hydrocephalus, extra-axial collection or mass lesion/mass effect. Vascular: No hyperdense vessel or unexpected calcification. Skull: Normal. Negative for fracture or focal lesion. Sinuses/Orbits: The visualized paranasal sinuses are essentially clear. The mastoid air cells are unopacified. Other: None. IMPRESSION: Normal head CT. Electronically Signed   By: Charline Bills M.D.   On: 11/08/2017 18:52    ECG & Cardiac Imaging    RSR, 63, early repolarization  Assessment & Plan    1.  Presyncope and syncope: Patient reports that she has been experiencing presyncope and syncope over the past several weeks, with a total of 4 occurrences.  Symptoms typically occur while standing and become associated with lightheadedness and nausea.  She has not sustained any trauma.  She had an episode of syncope on May 1 which prompted admission.  Vital signs were stable in the emergency department.  Troponins are normal.  ECG is normal without any evidence of high-grade heart block.  Echocardiogram pending.  Provided that echo shows normal LV function, would not pursue any additional inpatient evaluation and we can arrange for outpatient event monitoring.  2.  Atypical chest pain: Patient reports intermittent chest pain, often associated with significant upper/mid back pain.  Chest pain is usually more pleuritic in nature but not always.  She reported this while in the emergency department yesterday.  Troponin is normal.  ECG nonacute.  Given history of poorly controlled diabetes, we will arrange for outpatient follow-up and stress testing.  No further inpatient evaluation planned.  3.  Poorly controlled diabetes mellitus: A1c 11.4.  Followed by endocrinology at [facility]  Stressed importance of compliance and follow-up.  4.  Chronic pain: With history of narcotic addiction on chronic methadone therapy.  Per internal  medicine.  5.  Tobacco abuse: Cessation advised.  6.  Urinary tract infection: Antibiotics per internal medicine.  Signed, Nicolasa Ducking, NP 11/09/2017, 9:53 AM  For questions  or updates, please contact   Please consult [email or web address] for contact info under Cardiology/STEMI.

## 2017-11-09 NOTE — Progress Notes (Signed)
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  Wt Readings from Last 15 Encounters:  11/09/17 136 lb 11.2 oz (62 kg)  10/21/17 145 lb (65.8 kg)  09/15/17 145 lb (65.8 kg)  08/06/17 147 lb (66.7 kg)  06/24/17 142 lb (64.4 kg)  05/24/17 144 lb (65.3 kg)  05/09/17 144 lb (65.3 kg)  12/24/16 144 lb (65.3 kg)  12/10/16 145 lb (65.8 kg)  11/21/16 140 lb (63.5 kg)  11/02/16 142 lb (64.4 kg)  10/03/16 140 lb (63.5 kg)  09/12/16 140 lb (63.5 kg)  07/21/16 140 lb (63.5 kg)  06/09/16 144 lb (65.3 kg)    Body mass index is 25.83 kg/m. Patient meets criteria for overweight based on current BMI.   Current diet order is CHO modified, patient is consuming approximately 100% of meals at this time. Labs and medications reviewed.   No nutrition interventions warranted at this time. If nutrition issues arise, please consult RD.   Betsey Holiday MS, RD, LDN Pager #- [phone] After Hours Pager: [phone]

## 2017-11-09 NOTE — H&P (Signed)
Karen Lindsey is an 52 y.o. female.   Chief Complaint: Loss of consciousness HPI: The patient with past medical history of hypertension, diabetes with neuropathy, and rheumatoid arthritis presents to the emergency department via EMS after an episode of losing consciousness.  According to family, the patient may or may not have had respiratory difficulty up to and including respiratory arrest.  EMS did not observe any respiratory failure or distress upon arrival.  In the emergency department the patient had intermittent chest pain but no indication of myocardial ischemia. Two sets of troponin were negative but she continued to have vague intermittent episodes of chest pain which prompted the emergency department staff to call the hospitalist service for admission.   Past Medical History:  Diagnosis Date  . Arthritis    rheumatoid  . Diabetes mellitus without complication ([place])   . Hypertension   . Neuropathy   . RA (rheumatoid arthritis) ([place])     Past Surgical History:  Procedure Laterality Date  . ABDOMINAL HYSTERECTOMY    . CESAREAN SECTION    . CHOLECYSTECTOMY    . KNEE SURGERY    . MANDIBLE FRACTURE SURGERY      History reviewed. No pertinent family history. No history of heart disease.  Social History:  reports that she has been smoking cigarettes.  She has been smoking about 0.50 packs per day. She has never used smokeless tobacco. She reports that she does not drink alcohol or use drugs.  Allergies:  Allergies  Allergen Reactions  . Ultram [Tramadol Hcl] Palpitations    Medications Prior to Admission  Medication Sig Dispense Refill  . amoxicillin (AMOXIL) 500 MG capsule Take 1 capsule (500 mg total) by mouth 3 (three) times daily. (Patient not taking: Reported on 10/21/2017) 21 capsule 0  . baclofen (LIORESAL) 10 MG tablet Take 1 tablet (10 mg total) by mouth 3 (three) times daily. (Patient not taking: Reported on 05/09/2017) 30 tablet 0  . benzonatate (TESSALON PERLES) 100  MG capsule Take 1 capsule (100 mg total) by mouth every 6 (six) hours as needed for cough. 30 capsule 0  . Calcium Carb-Ergocalciferol 500-200 MG-UNIT TABS Take 1 tablet by mouth daily.    . canagliflozin (INVOKANA) 100 MG TABS tablet Take 100 mg by mouth daily.    . cyclobenzaprine (FLEXERIL) 5 MG tablet Take 1 tablet (5 mg total) by mouth 3 (three) times daily as needed for muscle spasms. (Patient not taking: Reported on 05/09/2017) 15 tablet 0  . diclofenac (VOLTAREN) 50 MG EC tablet Take 1 tablet (50 mg total) by mouth 2 (two) times daily. (Patient not taking: Reported on 05/09/2017) 30 tablet 0  . DOCOSAHEXAENOIC ACID PO Take 1 capsule by mouth daily.    . famotidine (PEPCID) 20 MG tablet Take 1 tablet (20 mg total) by mouth 2 (two) times daily. 60 tablet 0  . gabapentin (NEURONTIN) 300 MG capsule Take 1,200 mg by mouth 3 (three) times daily.     Marland Kitchen glucosamine-chondroitin 500-400 MG tablet Take 1 tablet by mouth daily.    Marland Kitchen ibuprofen (ADVIL,MOTRIN) 200 MG tablet Take 200 mg by mouth every 6 (six) hours as needed.    Marland Kitchen ibuprofen (ADVIL,MOTRIN) 600 MG tablet Take 1 tablet (600 mg total) every 8 (eight) hours as needed by mouth. 30 tablet 0  . insulin NPH Human (HUMULIN N) 100 UNIT/ML injection Inject 0.4 mLs (40 Units total) into the skin at bedtime. (Patient not taking: Reported on 05/09/2017) 10 mL 0  . insulin NPH-regular  Human (NOVOLIN 70/30) (70-30) 100 UNIT/ML injection 20-30 units [place] before breakfast and dinner  Check glucose prior to usage (Patient taking differently: Inject 32 Units into the skin 2 (two) times daily with a meal. ) 10 mL 0  . ketorolac (TORADOL) 10 MG tablet Take 1 tablet (10 mg total) by mouth every 8 (eight) hours as needed. (Patient not taking: Reported on 05/09/2017) 20 tablet 0  . lisinopril-hydrochlorothiazide (PRINZIDE,ZESTORETIC) 10-12.5 MG tablet Take 1 tablet by mouth daily.    Marland Kitchen LORazepam (ATIVAN) 0.5 MG tablet Take 1 tablet (0.5 mg total) by mouth every 8  (eight) hours as needed for anxiety. (Patient not taking: Reported on 05/09/2017) 10 tablet 0  . meloxicam (MOBIC) 15 MG tablet Take 1 tablet (15 mg total) by mouth daily. (Patient not taking: Reported on 05/09/2017) 30 tablet 0  . metFORMIN (GLUCOPHAGE) 500 MG tablet Take 500 mg by mouth every evening.     . methadone (DOLOPHINE) 5 MG/5ML solution Take 120 mg by mouth daily.     . Multiple Vitamin (MULTIVITAMIN WITH MINERALS) TABS tablet Take 1 tablet by mouth daily.    . ondansetron (ZOFRAN) 4 MG tablet Take 1 tablet (4 mg total) by mouth every 8 (eight) hours as needed for nausea or vomiting. (Patient not taking: Reported on 05/09/2017) 20 tablet 0  . ondansetron (ZOFRAN) 4 MG tablet Take 1 tablet (4 mg total) by mouth daily as needed for nausea or vomiting. 14 tablet 0  . oxyCODONE-acetaminophen (PERCOCET) 7.5-325 MG tablet Take 1 tablet by mouth every 6 (six) hours as needed for severe pain. (Patient not taking: Reported on 05/09/2017) 12 tablet 0  . promethazine (PHENERGAN) 12.5 MG tablet Take 1 tablet (12.5 mg total) by mouth every 6 (six) hours as needed for nausea or vomiting. (Patient not taking: Reported on 05/09/2017) 12 tablet 0  . vitamin E 400 UNIT capsule Take 400 Units by mouth daily.      Results for orders placed or performed during the hospital encounter of 11/08/17 (from the past 48 hour(s))  Glucose, capillary     Status: Abnormal   Collection Time: 11/08/17  3:20 PM  Result Value Ref Range   Glucose-Capillary 298 (H) 65 - 99 mg/dL  Basic metabolic panel     Status: Abnormal   Collection Time: 11/08/17  3:21 PM  Result Value Ref Range   Sodium 137 135 - 145 mmol/L   Potassium 4.0 3.5 - 5.1 mmol/L   Chloride 106 101 - 111 mmol/L   CO2 26 22 - 32 mmol/L   Glucose, Bld 361 (H) 65 - 99 mg/dL   BUN 22 (H) 6 - 20 mg/dL   Creatinine, Ser 0.70 0.44 - 1.00 mg/dL   Calcium 8.5 (L) 8.9 - 10.3 mg/dL   GFR calc non Af Amer >60 >60 mL/min   GFR calc Af Amer >60 >60 mL/min     Comment: (NOTE) The eGFR has been calculated using the CKD EPI equation. This calculation has not been validated in all clinical situations. eGFR's persistently <60 mL/min signify possible Chronic Kidney Disease.    Anion gap 5 5 - 15    Comment: Performed at [facility], [address] [place], [place] [address]  CBC     Status: None   Collection Time: 11/08/17  3:21 PM  Result Value Ref Range   WBC 7.8 3.6 - 11.0 K/uL   RBC 4.83 3.80 - 5.20 MIL/uL   Hemoglobin 13.8 12.0 - 16.0 g/dL  HCT 41.4 35.0 - 47.0 %   MCV 85.7 80.0 - 100.0 fL   MCH 28.7 26.0 - 34.0 pg   MCHC 33.4 32.0 - 36.0 g/dL   RDW 13.4 11.5 - 14.5 %   Platelets 174 150 - 440 K/uL    Comment: Performed at [facility], [address] [place], [place] [address]  Troponin I     Status: None   Collection Time: 11/08/17  3:21 PM  Result Value Ref Range   Troponin I <0.03 <0.03 ng/mL    Comment: Performed at [facility], [address] [place], [place] [address]  Hepatic function panel     Status: Abnormal   Collection Time: 11/08/17  3:21 PM  Result Value Ref Range   Total Protein 6.5 6.5 - 8.1 g/dL   Albumin 3.6 3.5 - 5.0 g/dL   AST 25 15 - 41 U/L   ALT 39 14 - 54 U/L   Alkaline Phosphatase 82 38 - 126 U/L   Total Bilirubin 0.3 0.3 - 1.2 mg/dL   Bilirubin, Direct <0.1 (L) 0.1 - 0.5 mg/dL   Indirect Bilirubin NOT CALCULATED 0.3 - 0.9 mg/dL    Comment: Performed at [facility], [address] [place], [place] [address]  Urinalysis, Complete w Microscopic     Status: Abnormal   Collection Time: 11/08/17  3:29 PM  Result Value Ref Range   Color, Urine YELLOW (A) YELLOW   APPearance CLOUDY (A) CLEAR   Specific Gravity, Urine 1.032 (H) 1.005 - 1.030   pH 5.0 5.0 - 8.0   Glucose, UA >=500 (A) NEGATIVE mg/dL   Hgb urine dipstick SMALL (A) NEGATIVE   Bilirubin Urine NEGATIVE NEGATIVE   Ketones, ur NEGATIVE NEGATIVE mg/dL   Protein, ur NEGATIVE NEGATIVE mg/dL   Nitrite  NEGATIVE NEGATIVE   Leukocytes, UA LARGE (A) NEGATIVE   RBC / HPF 21-50 0 - 5 RBC/hpf   WBC, UA >50 (H) 0 - 5 WBC/hpf   Bacteria, UA NONE SEEN NONE SEEN   Squamous Epithelial / LPF 0-5 0 - 5    Comment: Please note change in reference range. Performed at [facility], [address] [place], [place] [address]   Troponin I     Status: None   Collection Time: 11/08/17  7:55 PM  Result Value Ref Range   Troponin I <0.03 <0.03 ng/mL    Comment: Performed at [facility], [address] [place], [place] [address]  Glucose, capillary     Status: Abnormal   Collection Time: 11/08/17 10:09 PM  Result Value Ref Range   Glucose-Capillary 136 (H) 65 - 99 mg/dL  TSH     Status: None   Collection Time: 11/08/17 10:39 PM  Result Value Ref Range   TSH 0.747 0.350 - 4.500 uIU/mL    Comment: Performed by a 3rd Generation assay with a functional sensitivity of <=0.01 uIU/mL. Performed at [facility], [address] [place],  [address]   Troponin I     Status: None   Collection Time: 11/08/17 10:39 PM  Result Value Ref Range   Troponin I <0.03 <0.03 ng/mL    Comment: Performed at [facility], [address] [place],  [address]   Dg Chest 1 View  Result Date: 11/08/2017 CLINICAL DATA:  Chest pain and weakness. EXAM: CHEST  1 VIEW COMPARISON:  09/15/2017 FINDINGS: The heart size and mediastinal contours are within normal limits. Both lungs are clear. The visualized  skeletal structures are unremarkable. IMPRESSION: No active disease. Electronically Signed   By: Earle Gell M.D.   On: 11/08/2017 18:50   Ct Head Wo Contrast  Result Date: 11/08/2017 CLINICAL DATA:  Syncope, headaches EXAM: CT HEAD WITHOUT CONTRAST TECHNIQUE: Contiguous axial images were obtained from the base of the skull through the vertex without intravenous contrast. COMPARISON:  06/24/2017 FINDINGS: Brain: No evidence of acute infarction, hemorrhage, hydrocephalus, extra-axial  collection or mass lesion/mass effect. Vascular: No hyperdense vessel or unexpected calcification. Skull: Normal. Negative for fracture or focal lesion. Sinuses/Orbits: The visualized paranasal sinuses are essentially clear. The mastoid air cells are unopacified. Other: None. IMPRESSION: Normal head CT. Electronically Signed   By: Julian Hy M.D.   On: 11/08/2017 18:52    Review of Systems  Constitutional: Negative for chills and fever.  HENT: Negative for sore throat and tinnitus.   Eyes: Negative for blurred vision and redness.  Respiratory: Positive for shortness of breath. Negative for cough.   Cardiovascular: Positive for chest pain (resolved). Negative for palpitations, orthopnea and PND.  Gastrointestinal: Negative for abdominal pain, diarrhea, nausea and vomiting.  Genitourinary: Negative for dysuria, frequency and urgency.  Musculoskeletal: Negative for joint pain and myalgias.  Skin: Negative for rash.       No lesions  Neurological: Positive for loss of consciousness. Negative for speech change, focal weakness and weakness.  Endo/Heme/Allergies: Does not bruise/bleed easily.       No temperature intolerance  Psychiatric/Behavioral: Negative for depression and suicidal ideas.    Blood pressure (!) 158/77, pulse (!) 58, temperature 97.9 F (36.6 C), temperature source Oral, resp. rate 17, height 5' 1"  (1.549 m), weight 61.6 kg (135 lb 14.4 oz), SpO2 98 %. Physical Exam  Vitals reviewed. Constitutional: She is oriented to person, place, and time. She appears well-developed and well-nourished. No distress.  HENT:  Head: Normocephalic and atraumatic.  Mouth/Throat: Oropharynx is clear and moist.  Eyes: Pupils are equal, round, and reactive to light. Conjunctivae and EOM are normal. No scleral icterus.  Neck: Normal range of motion. Neck supple. No JVD present. No tracheal deviation present. No thyromegaly present.  Cardiovascular: Normal rate, regular rhythm and normal  heart sounds. Exam reveals no gallop and no friction rub.  No murmur heard. Respiratory: Effort normal and breath sounds normal.  GI: Soft. Bowel sounds are normal. She exhibits no distension. There is no tenderness.  Genitourinary:  Genitourinary Comments: Deferred  Musculoskeletal: Normal range of motion. She exhibits no edema.  Lymphadenopathy:    She has no cervical adenopathy.  Neurological: She is alert and oriented to person, place, and time. No cranial nerve deficit. She exhibits normal muscle tone.  Skin: Skin is warm and dry. No rash noted. No erythema.  Psychiatric: She has a normal mood and affect. Her behavior is normal. Judgment and thought content normal.     Assessment/Plan This is a 52 year old female admitted for chest pain. 1.  Chest pain: Atypical; intermittent.  Continue to cycle cardiac biomarkers.  Monitor telemetry.  Cardiology consult ordered. 2.  Syncope: Questionable respiratory arrest with decreased consciousness.  Neurology consultation at discretion of the primary team. 3.  Leukosuria: No bacteria or nitrites. I suspect the patient has vaginal candidiasis (urethritis) due to uncontrolled blood sugar. She has already received a dose of IV cephalexin for suspected UTI. Start diflucan. Await urine culture results. If bacterial, restart antibiotics.   4. Diabetes mellitus type 2: continue basal insulin as well as sliding scale while hospitalized. Gabapentin for  neuropathy.  5. DVT prophylaxis: Lovenox 6. GI prophylaxis: none The patient is a full code. Time spent on admission orders and patient care approximately 45 minutes.   Harrie Foreman, MD 11/09/2017, 3:28 AM

## 2017-11-09 NOTE — Telephone Encounter (Signed)
TCM....  Patient is being discharged   They saw C. Brion Aliment  They are scheduled to see Flavia Shipper on 5/21  They were seen for Presyncope and syncope  They need to be seen within 1 week  Pt added to wait list   Please call

## 2017-11-10 LAB — HIV ANTIBODY (ROUTINE TESTING W REFLEX): HIV Screen 4th Generation wRfx: NONREACTIVE

## 2017-11-10 NOTE — Telephone Encounter (Signed)
Mail box is full and cannot accept messages at this time.

## 2017-11-14 NOTE — Telephone Encounter (Signed)
I left a message for the patient to call back 

## 2017-11-28 ENCOUNTER — Ambulatory Visit: Payer: Self-pay | Admitting: Nurse Practitioner

## 2017-11-28 ENCOUNTER — Encounter

## 2018-01-05 ENCOUNTER — Ambulatory Visit: Payer: Self-pay | Admitting: Nurse Practitioner

## 2018-01-08 ENCOUNTER — Emergency Department
Admission: EM | Admit: 2018-01-08 | Discharge: 2018-01-08 | Discharge status: Against Medical Advice | Payer: Self-pay | Attending: Emergency Medicine | Admitting: Emergency Medicine

## 2018-01-08 ENCOUNTER — Other Ambulatory Visit: Payer: Self-pay

## 2018-01-08 ENCOUNTER — Encounter: Payer: Self-pay | Admitting: Emergency Medicine

## 2018-01-08 ENCOUNTER — Emergency Department: Payer: Self-pay

## 2018-01-08 DIAGNOSIS — F17210 Nicotine dependence, cigarettes, uncomplicated: Secondary | ICD-10-CM | POA: Insufficient documentation

## 2018-01-08 DIAGNOSIS — Z794 Long term (current) use of insulin: Secondary | ICD-10-CM | POA: Insufficient documentation

## 2018-01-08 DIAGNOSIS — Z9049 Acquired absence of other specified parts of digestive tract: Secondary | ICD-10-CM | POA: Insufficient documentation

## 2018-01-08 DIAGNOSIS — E119 Type 2 diabetes mellitus without complications: Secondary | ICD-10-CM | POA: Insufficient documentation

## 2018-01-08 DIAGNOSIS — I10 Essential (primary) hypertension: Secondary | ICD-10-CM | POA: Insufficient documentation

## 2018-01-08 DIAGNOSIS — Z79899 Other long term (current) drug therapy: Secondary | ICD-10-CM | POA: Insufficient documentation

## 2018-01-08 DIAGNOSIS — I208 Other forms of angina pectoris: Secondary | ICD-10-CM

## 2018-01-08 DIAGNOSIS — I200 Unstable angina: Secondary | ICD-10-CM | POA: Insufficient documentation

## 2018-01-08 LAB — BASIC METABOLIC PANEL
ANION GAP: 11 (ref 5–15)
BUN: 19 mg/dL (ref 6–20)
CHLORIDE: 96 mmol/L — AB (ref 98–111)
CO2: 29 mmol/L (ref 22–32)
Calcium: 8.8 mg/dL — ABNORMAL LOW (ref 8.9–10.3)
Creatinine, Ser: 0.73 mg/dL (ref 0.44–1.00)
GFR calc non Af Amer: >60 mL/min (ref >60)
GLUCOSE: 444 mg/dL — AB (ref 70–99)
POTASSIUM: 3.2 mmol/L — AB (ref 3.5–5.1)
Sodium: 136 mmol/L (ref 135–145)

## 2018-01-08 LAB — URINALYSIS, COMPLETE (UACMP) WITH MICROSCOPIC
Bacteria, UA: NONE SEEN
Bilirubin Urine: NEGATIVE
Glucose, UA: >=500 mg/dL — AB
Hgb urine dipstick: NEGATIVE
KETONES UR: NEGATIVE mg/dL
LEUKOCYTES UA: NEGATIVE
Nitrite: NEGATIVE
PROTEIN: NEGATIVE mg/dL
Specific Gravity, Urine: 1.026 (ref 1.005–1.030)
pH: 5 (ref 5.0–8.0)

## 2018-01-08 LAB — CBC
HEMATOCRIT: 43.3 % (ref 35.0–47.0)
HEMOGLOBIN: 14.7 g/dL (ref 12.0–16.0)
MCH: 29.3 pg (ref 26.0–34.0)
MCHC: 33.9 g/dL (ref 32.0–36.0)
MCV: 86.4 fL (ref 80.0–100.0)
Platelets: 164 10*3/uL (ref 150–440)
RBC: 5.02 MIL/uL (ref 3.80–5.20)
RDW: 13.3 % (ref 11.5–14.5)
WBC: 9.4 10*3/uL (ref 3.6–11.0)

## 2018-01-08 LAB — TROPONIN I: Troponin I: <0.03 ng/mL (ref <0.03)

## 2018-01-08 MED ORDER — IBUPROFEN 600 MG PO TABS
600.0000 mg | ORAL_TABLET | Freq: Once | ORAL | Status: AC
  Administered 2018-01-08: 600 mg via ORAL

## 2018-01-08 MED ORDER — INSULIN ASPART 100 UNIT/ML ~~LOC~~ SOLN
5.0000 [IU] | Freq: Once | SUBCUTANEOUS | Status: AC
  Administered 2018-01-08: 5 [IU] via INTRAVENOUS
  Filled 2018-01-08: qty 1

## 2018-01-08 MED ORDER — IBUPROFEN 600 MG PO TABS
ORAL_TABLET | ORAL | Status: AC
  Administered 2018-01-08: 600 mg via ORAL
  Filled 2018-01-08: qty 1

## 2018-01-08 MED ORDER — ACETAMINOPHEN 500 MG PO TABS
ORAL_TABLET | ORAL | Status: AC
  Filled 2018-01-08: qty 2

## 2018-01-08 MED ORDER — NITROGLYCERIN 0.4 MG SL SUBL
0.4000 mg | SUBLINGUAL_TABLET | SUBLINGUAL | Status: DC | PRN
  Administered 2018-01-08: 0.4 mg via SUBLINGUAL
  Filled 2018-01-08 (×2): qty 1

## 2018-01-08 MED ORDER — SODIUM CHLORIDE 0.9 % IV BOLUS
1000.0000 mL | Freq: Once | INTRAVENOUS | Status: AC
  Administered 2018-01-08: 1000 mL via INTRAVENOUS

## 2018-01-08 MED ORDER — POTASSIUM CHLORIDE CRYS ER 20 MEQ PO TBCR
40.0000 meq | EXTENDED_RELEASE_TABLET | Freq: Once | ORAL | Status: AC
  Administered 2018-01-08: 40 meq via ORAL
  Filled 2018-01-08: qty 2

## 2018-01-08 NOTE — ED Notes (Signed)
Pt called out requesting to go home. MD aware

## 2018-01-08 NOTE — ED Provider Notes (Signed)
[facility] Emergency Department Provider Note ____________________________________________   First MD Initiated Contact with Patient 01/08/18 2020     (approximate)  I have reviewed the triage vital signs and the nursing notes.   HISTORY  Chief Complaint Chest Pain  HPI Karen Lindsey is a 52 y.o. female with a history of chronic back pain, hypertension and diabetes as well as a family history of CAD who was presented to the emergency department with chest pain.  Says that she was sitting in church when the chest pain started.  Says was a stabbing pain to the center of her chest which radiated to her left shoulder.  She says that it was intermittent for about 30 to 40 minutes until completely resolved.  However, she says that she now has a dull 4 out of 10 pain to the left side of the chest.  Says the pain was also radiating up to her jaw into her back.  Was associated with shortness of breath as well as nausea but no vomiting or diaphoresis.  She was given 324 mg of aspirin in route but without any nitro in route.  Past Medical History:  Diagnosis Date  . Arthritis    rheumatoid  . Chronic pain    a. upper/mid back.  . Diabetic peripheral neuropathy (HCC)   . Fatty liver   . Hypertension   . Insulin dependent diabetes mellitus (HCC)    a. Dx ~ 2011.  . Narcotic addiction (HCC)    a. Followed in substance abuse center in [place] - on chronic methadone.  . Neuropathy   . RA (rheumatoid arthritis) (HCC)   . Tobacco abuse     Patient Active Problem List   Diagnosis Date Noted  . Chest pain 11/08/2017    Past Surgical History:  Procedure Laterality Date  . ABDOMINAL HYSTERECTOMY    . CESAREAN SECTION    . CHOLECYSTECTOMY    . KNEE SURGERY    . MANDIBLE FRACTURE SURGERY      Prior to Admission medications   Medication Sig Start Date End Date Taking? Authorizing Provider  baclofen (LIORESAL) 10 MG tablet Take 1 tablet (10 mg total) by mouth 3 (three)  times daily. Patient not taking: Reported on 05/09/2017 10/20/15   Kem Boroughs B, FNP  benzonatate (TESSALON PERLES) 100 MG capsule Take 1 capsule (100 mg total) by mouth every 6 (six) hours as needed for cough. 08/06/17 08/06/18  Jeanmarie Plant, MD  Calcium Carb-Ergocalciferol 500-200 MG-UNIT TABS Take 1 tablet by mouth daily.    [provider]  canagliflozin (INVOKANA) 100 MG TABS tablet Take 100 mg by mouth daily. 02/24/17   [provider]  cephALEXin (KEFLEX) 500 MG capsule Take 1 capsule (500 mg total) by mouth every 12 (twelve) hours. 11/09/17   Salary, Evelena Asa, MD  cyclobenzaprine (FLEXERIL) 5 MG tablet Take 1 tablet (5 mg total) by mouth 3 (three) times daily as needed for muscle spasms. Patient not taking: Reported on 05/09/2017 11/02/16   Menshew, Charlesetta Ivory, PA-C  diclofenac (VOLTAREN) 50 MG EC tablet Take 1 tablet (50 mg total) by mouth 2 (two) times daily. Patient not taking: Reported on 05/09/2017 11/02/16   Menshew, Charlesetta Ivory, PA-C  DOCOSAHEXAENOIC ACID PO Take 1 capsule by mouth daily.    [provider]  famotidine (PEPCID) 20 MG tablet Take 1 tablet (20 mg total) by mouth 2 (two) times daily. 09/15/17 09/15/18  Merrily Brittle, MD  fluconazole (DIFLUCAN)  150 MG tablet Take 1 tablet (150 mg total) by mouth daily. 11/10/17   Salary, Evelena Asa, MD  gabapentin (NEURONTIN) 300 MG capsule Take 1,200 mg by mouth 3 (three) times daily.     [provider]  glucosamine-chondroitin 500-400 MG tablet Take 1 tablet by mouth daily.    [provider]  ibuprofen (ADVIL,MOTRIN) 200 MG tablet Take 200 mg by mouth every 6 (six) hours as needed.    [provider]  ibuprofen (ADVIL,MOTRIN) 600 MG tablet Take 1 tablet (600 mg total) every 8 (eight) hours as needed by mouth. 05/24/17   Merrily Brittle, MD  insulin NPH Human (HUMULIN N) 100 UNIT/ML injection Inject 0.4 mLs (40 Units total) into the skin at bedtime. Patient not taking: Reported  on 05/09/2017 05/02/16   [name], MD  insulin NPH-regular Human (NOVOLIN 70/30) (70-30) 100 UNIT/ML injection 20-30 units [place] before breakfast and dinner  Check glucose prior to usage Patient taking differently: Inject 32 Units into the skin 2 (two) times daily with a meal.  09/13/16   Kentavious Michele, Myra Rude, MD  ketorolac (TORADOL) 10 MG tablet Take 1 tablet (10 mg total) by mouth every 8 (eight) hours as needed. Patient not taking: Reported on 05/09/2017 05/12/15   [name], MD  lisinopril-hydrochlorothiazide (PRINZIDE,ZESTORETIC) 10-12.5 MG tablet Take 1 tablet by mouth daily.    [provider]  LORazepam (ATIVAN) 0.5 MG tablet Take 1 tablet (0.5 mg total) by mouth every 8 (eight) hours as needed for anxiety. Patient not taking: Reported on 05/09/2017 03/12/15   Darien Ramus, MD  meloxicam (MOBIC) 15 MG tablet Take 1 tablet (15 mg total) by mouth daily. Patient not taking: Reported on 05/09/2017 10/20/15   Kem Boroughs B, FNP  metFORMIN (GLUCOPHAGE) 500 MG tablet Take 500 mg by mouth every evening.     [provider]  methadone (DOLOPHINE) 5 MG/5ML solution Take 120 mg by mouth daily.     [provider]  Multiple Vitamin (MULTIVITAMIN WITH MINERALS) TABS tablet Take 1 tablet by mouth daily.    [provider]  ondansetron (ZOFRAN) 4 MG tablet Take 1 tablet (4 mg total) by mouth every 8 (eight) hours as needed for nausea or vomiting. Patient not taking: Reported on 05/09/2017 10/27/15   Phineas Semen, MD  ondansetron [facility]) 4 MG tablet Take 1 tablet (4 mg total) by mouth daily as needed for nausea or vomiting. 06/24/17 06/24/18  Willy Eddy, MD  oxyCODONE-acetaminophen (PERCOCET) 7.5-325 MG tablet Take 1 tablet by mouth every 6 (six) hours as needed for severe pain. Patient not taking: Reported on 05/09/2017 12/10/16   Joni Reining, PA-C  promethazine (PHENERGAN) 12.5 MG tablet Take 1 tablet (12.5 mg total) by mouth every 6  (six) hours as needed for nausea or vomiting. Patient not taking: Reported on 05/09/2017 02/15/16   Willy Eddy, MD  vitamin E 400 UNIT capsule Take 400 Units by mouth daily.    [provider]    Allergies Ultram [tramadol hcl]  No family history on file.  Social History Social History   Tobacco Use  . Smoking status: Current Every Day Smoker    Packs/day: 0.50    Types: Cigarettes  . Smokeless tobacco: Never Used  . Tobacco comment: 30+ years  Substance Use Topics  . Alcohol use: No    Comment: Hasn't had a drink since 1998  . Drug use: Yes    Comment: Previously abused pain pills. prescribed methadone  Review of Systems  Constitutional: No fever/chills Eyes: No visual changes. ENT: No sore throat. Cardiovascular: As above Respiratory: As above Gastrointestinal: No abdominal pain.   no vomiting.  No diarrhea.  No constipation. Genitourinary: Negative for dysuria. Musculoskeletal: Negative for back pain. Skin: Negative for rash. Neurological: Negative for headaches, focal weakness or numbness.   ____________________________________________   PHYSICAL EXAM:  VITAL SIGNS: ED Triage Vitals  Enc Vitals Group     BP 01/08/18 2000 126/90     Pulse Rate 01/08/18 2005 70     Resp 01/08/18 2005 16     Temp 01/08/18 2005 98.3 F (36.8 C)     Temp Source 01/08/18 2005 Oral     SpO2 01/08/18 2000 94 %     Weight --      Height --      Head Circumference --      Peak Flow --      Pain Score 01/08/18 2005 5     Pain Loc --      Pain Edu? --      Excl. in GC? --     Constitutional: Alert and oriented. Well appearing and in no acute distress. Eyes: Conjunctivae are normal.  Head: Atraumatic. Nose: No congestion/rhinnorhea. Mouth/Throat: Mucous membranes are moist.  Neck: No stridor.   Cardiovascular: Normal rate, regular rhythm. Grossly normal heart sounds.  Good peripheral circulation with equal bilateral radial pulses.  Chest pain is not  reproducible to palpation. Respiratory: Normal respiratory effort.  No retractions. Lungs CTAB. Gastrointestinal: Soft and nontender. No distention. No CVA tenderness. Musculoskeletal: No lower extremity tenderness nor edema.  No joint effusions. Neurologic:  Normal speech and language. No gross focal neurologic deficits are appreciated. Skin:  Skin is warm, dry and intact. No rash noted. Psychiatric: Mood and affect are normal. Speech and behavior are normal.  ____________________________________________   LABS (all labs ordered are listed, but only abnormal results are displayed)  Labs Reviewed  BASIC METABOLIC PANEL - Abnormal; Notable for the following components:      Result Value   Potassium 3.2 (*)    Chloride 96 (*)    Glucose, Bld 444 (*)    Calcium 8.8 (*)    All other components within normal limits  CBC  TROPONIN I   ____________________________________________  EKG  ED ECG REPORT I, Arelia Longest, the attending physician, personally viewed and interpreted this ECG.   Date: 01/08/2018  EKG Time: 2001  Rate: 85  Rhythm: normal sinus rhythm  Axis: Normal  Intervals:none  ST&T Change: No ST segment elevation or depression.  No abnormal T wave inversion.  Static in the EKG reading which is likely confounding the machine read.  We will repeat.  ED ECG REPORT I, Arelia Longest, the attending physician, personally viewed and interpreted this ECG.   Date: 01/08/2018  EKG Time: 2030  Rate: 59  Rhythm: Sinus bradycardia  Axis: Short PR  Intervals:none  ST&T Change: No ST segment elevation or depression.  No abnormal T wave inversion.   ____________________________________________  RADIOLOGY  Chest x-ray without acute process. ____________________________________________   PROCEDURES  Procedure(s) performed:   Procedures  Critical Care performed:   ____________________________________________   INITIAL IMPRESSION / ASSESSMENT AND PLAN / ED  COURSE  Pertinent labs & imaging results that were available during my care of the patient were reviewed by me and considered in my medical decision making (see chart for details).  Differential diagnosis includes, but is not limited to, ACS,  aortic dissection, pulmonary embolism, cardiac tamponade, pneumothorax, pneumonia, pericarditis, myocarditis, GI-related causes including esophagitis/gastritis, and musculoskeletal chest wall pain.   As part of my medical decision making, I reviewed the following data within the electronic MEDICAL RECORD NUMBER Notes from prior ED visits    Clinical Course as of Jan 09 2055  Mon Jan 08, 2018  2038 Glucose(!): 444 [KN]    Clinical Course User Index [KN] Roanna Raider    ----------------------------------------- 9:39 PM on 01/08/2018 -----------------------------------------  Patient at this time is pain-free after nitroglycerin.  I reviewed her records and she was admitted this past May and was recommended for outpatient stress test.  However, the patient says that she never followed up.  I recommended that she be admitted at this time because of her constellation of symptoms being quite consistent with ACS.  The patient is refusing admission.  She is agreeable to a second troponin and says that she will call the cardiology office in the morning.  ----------------------------------------- 9:56 PM on 01/08/2018 -----------------------------------------  Patient remains pain-free at this time.  She is refusing the second troponin and says she would like to be discharged home.  She is understanding of the consequences that could potentially arise from an incomplete medical work-up including death or permanent disability.  She is clinically sober and understands the ramifications of her decisions.  She has clinical capacity at this time.  She is aware that she may return to the emergency department at any time for further work-up and treatment.   She says otherwise that she will call the cardiologist first thing in the morning for follow-up appointment urgently within the next 1 to 2 days. ____________________________________________   FINAL CLINICAL IMPRESSION(S) / ED DIAGNOSES  Chest pain.    NEW MEDICATIONS STARTED DURING THIS VISIT:  New Prescriptions   No medications on file     Note:  This document was prepared using Dragon voice recognition software and may include unintentional dictation errors.     Myrna Blazer, MD 01/08/18 2157

## 2018-01-08 NOTE — ED Triage Notes (Signed)
Pt to ed via [facility] d/t chest pain that began while at church this evening. Per ems was substernal, then moved to left shoulder, neck, jaw, and back. Pt states pain resolved once in a\mbulance. 324 asa in route, VSS. Hx HTN, DM, neuropathy without meds in multiple days. CBG 515. Pt states life stressors at current-homeless, limited food sources

## 2018-01-08 NOTE — ED Notes (Signed)
ED Provider at bedside. 

## 2018-01-08 NOTE — ED Notes (Signed)
Pt states chest pain 0/10 after 1 SL Nitro. MD aware

## 2018-01-08 NOTE — ED Notes (Addendum)
ED Provider at bedside with this RN. Thorough discussion about risks of leaving AMA. Pt insists despite warnings against such. Pt did also ask to test her urine for infection so that she can get a call back if results are positive.

## 2018-01-08 NOTE — ED Notes (Signed)
Pt up to toilet without SHOB, difficulty, or complaint.

## 2018-01-09 ENCOUNTER — Telehealth: Payer: Self-pay | Admitting: Nurse Practitioner

## 2018-01-09 NOTE — Telephone Encounter (Signed)
Lmov for patient they were seen in ED  °Seen on 01/08/18 °Will try again at a later time °

## 2018-01-10 LAB — URINE CULTURE

## 2018-01-15 ENCOUNTER — Encounter: Payer: Self-pay | Admitting: Nurse Practitioner

## 2018-01-16 NOTE — Telephone Encounter (Signed)
Lmov for patient they were seen in ED  Seen on 01/08/18 Will try again at a later time

## 2018-01-19 NOTE — Telephone Encounter (Signed)
Unable to get a hold of patient  Will send letter

## 2018-01-20 ENCOUNTER — Emergency Department
Admission: EM | Admit: 2018-01-20 | Discharge: 2018-01-20 | Discharge status: Against Medical Advice | Payer: Medicaid Other | Attending: Emergency Medicine | Admitting: Emergency Medicine

## 2018-01-20 ENCOUNTER — Emergency Department: Payer: Medicaid Other

## 2018-01-20 ENCOUNTER — Other Ambulatory Visit: Payer: Self-pay

## 2018-01-20 DIAGNOSIS — Z5320 Procedure and treatment not carried out because of patient's decision for unspecified reasons: Secondary | ICD-10-CM | POA: Insufficient documentation

## 2018-01-20 DIAGNOSIS — E1140 Type 2 diabetes mellitus with diabetic neuropathy, unspecified: Secondary | ICD-10-CM | POA: Insufficient documentation

## 2018-01-20 DIAGNOSIS — F17210 Nicotine dependence, cigarettes, uncomplicated: Secondary | ICD-10-CM | POA: Insufficient documentation

## 2018-01-20 DIAGNOSIS — R002 Palpitations: Secondary | ICD-10-CM

## 2018-01-20 DIAGNOSIS — Z79899 Other long term (current) drug therapy: Secondary | ICD-10-CM | POA: Insufficient documentation

## 2018-01-20 DIAGNOSIS — R739 Hyperglycemia, unspecified: Secondary | ICD-10-CM | POA: Insufficient documentation

## 2018-01-20 DIAGNOSIS — Z794 Long term (current) use of insulin: Secondary | ICD-10-CM | POA: Insufficient documentation

## 2018-01-20 DIAGNOSIS — F1110 Opioid abuse, uncomplicated: Secondary | ICD-10-CM | POA: Insufficient documentation

## 2018-01-20 DIAGNOSIS — I10 Essential (primary) hypertension: Secondary | ICD-10-CM | POA: Insufficient documentation

## 2018-01-20 LAB — BASIC METABOLIC PANEL
ANION GAP: 17 — AB (ref 5–15)
BUN: 18 mg/dL (ref 6–20)
CHLORIDE: 96 mmol/L — AB (ref 98–111)
CO2: 22 mmol/L (ref 22–32)
Calcium: 9.3 mg/dL (ref 8.9–10.3)
Creatinine, Ser: 0.85 mg/dL (ref 0.44–1.00)
GFR calc Af Amer: >60 mL/min (ref >60)
GFR calc non Af Amer: >60 mL/min (ref >60)
GLUCOSE: 398 mg/dL — AB (ref 70–99)
POTASSIUM: 4.2 mmol/L (ref 3.5–5.1)
Sodium: 135 mmol/L (ref 135–145)

## 2018-01-20 LAB — CBC
HEMATOCRIT: 52.1 % — AB (ref 35.0–47.0)
HEMOGLOBIN: 17.8 g/dL — AB (ref 12.0–16.0)
MCH: 29.1 pg (ref 26.0–34.0)
MCHC: 34.1 g/dL (ref 32.0–36.0)
MCV: 85.3 fL (ref 80.0–100.0)
Platelets: 198 10*3/uL (ref 150–440)
RBC: 6.1 MIL/uL — ABNORMAL HIGH (ref 3.80–5.20)
RDW: 13.2 % (ref 11.5–14.5)
WBC: 10.3 10*3/uL (ref 3.6–11.0)

## 2018-01-20 LAB — TROPONIN I: Troponin I: <0.03 ng/mL (ref <0.03)

## 2018-01-20 MED ORDER — IBUPROFEN 600 MG PO TABS
600.0000 mg | ORAL_TABLET | Freq: Once | ORAL | Status: AC
  Administered 2018-01-20: 600 mg via ORAL
  Filled 2018-01-20: qty 1

## 2018-01-20 MED ORDER — GABAPENTIN 600 MG PO TABS
ORAL_TABLET | ORAL | Status: AC
  Administered 2018-01-20: 300 mg
  Filled 2018-01-20: qty 1

## 2018-01-20 MED ORDER — GABAPENTIN 300 MG PO CAPS
300.0000 mg | ORAL_CAPSULE | ORAL | Status: DC
  Filled 2018-01-20: qty 1

## 2018-01-20 MED ORDER — ACETAMINOPHEN 500 MG PO TABS
1000.0000 mg | ORAL_TABLET | Freq: Once | ORAL | Status: AC
  Administered 2018-01-20: 1000 mg via ORAL
  Filled 2018-01-20: qty 2

## 2018-01-20 NOTE — ED Notes (Signed)
Dr. Forbach at bedside.  

## 2018-01-20 NOTE — ED Notes (Signed)
Pt ambulatory to toilet with steady gait noted.  

## 2018-01-20 NOTE — ED Provider Notes (Signed)
[facility] Emergency Department Provider Note  ____________________________________________   First MD Initiated Contact with Patient 01/20/18 1801     (approximate)  I have reviewed the triage vital signs and the nursing notes.   HISTORY  Chief Complaint Palpitations    HPI Karen Lindsey is a 52 y.o. female who presents by EMS for evaluation of palpitations.  She reports that she has had palpitations in the past.  She says that she stopped taking her chronic methadone about 6 days ago due to the cost and she has been doing okay until today when she felt the acute onset of the palpitations.  However upon further questioning it sounds as if she has palpitations frequently that come and go.  Nothing particular makes her symptoms better or worse. She reports that she was on methadone for her chronic neuropathy and reports that her legs are hurting severely right now but that it is normal for her.  She denies chest pain, just palpitations, and denies shortness of breath, nausea, vomiting, and abdominal pain.  Past Medical History:  Diagnosis Date  . Arthritis    rheumatoid  . Chronic pain    a. upper/mid back.  . Diabetic peripheral neuropathy (HCC)   . Fatty liver   . Hypertension   . Insulin dependent diabetes mellitus (HCC)    a. Dx ~ 2011.  . Narcotic addiction (HCC)    a. Followed in substance abuse center in [place] - on chronic methadone.  . Neuropathy   . RA (rheumatoid arthritis) (HCC)   . Tobacco abuse     Patient Active Problem List   Diagnosis Date Noted  . Chest pain 11/08/2017    Past Surgical History:  Procedure Laterality Date  . ABDOMINAL HYSTERECTOMY    . CESAREAN SECTION    . CHOLECYSTECTOMY    . KNEE SURGERY    . MANDIBLE FRACTURE SURGERY      Prior to Admission medications   Medication Sig Start Date End Date Taking? Authorizing Provider  baclofen (LIORESAL) 10 MG tablet Take 1 tablet (10 mg total) by mouth 3 (three) times  daily. Patient not taking: Reported on 05/09/2017 10/20/15   Kem Boroughs B, FNP  benzonatate (TESSALON PERLES) 100 MG capsule Take 1 capsule (100 mg total) by mouth every 6 (six) hours as needed for cough. 08/06/17 08/06/18  Jeanmarie Plant, MD  Calcium Carb-Ergocalciferol 500-200 MG-UNIT TABS Take 1 tablet by mouth daily.    [provider]  canagliflozin (INVOKANA) 100 MG TABS tablet Take 100 mg by mouth daily. 02/24/17   [provider]  cephALEXin (KEFLEX) 500 MG capsule Take 1 capsule (500 mg total) by mouth every 12 (twelve) hours. 11/09/17   Salary, Evelena Asa, MD  cyclobenzaprine (FLEXERIL) 5 MG tablet Take 1 tablet (5 mg total) by mouth 3 (three) times daily as needed for muscle spasms. Patient not taking: Reported on 05/09/2017 11/02/16   Menshew, Charlesetta Ivory, PA-C  diclofenac (VOLTAREN) 50 MG EC tablet Take 1 tablet (50 mg total) by mouth 2 (two) times daily. Patient not taking: Reported on 05/09/2017 11/02/16   Menshew, Charlesetta Ivory, PA-C  DOCOSAHEXAENOIC ACID PO Take 1 capsule by mouth daily.    [provider]  famotidine (PEPCID) 20 MG tablet Take 1 tablet (20 mg total) by mouth 2 (two) times daily. 09/15/17 09/15/18  Merrily Brittle, MD  fluconazole (DIFLUCAN) 150 MG tablet Take 1 tablet (150 mg total) by mouth daily. 11/10/17   Salary,  Montell D, MD  gabapentin (NEURONTIN) 300 MG capsule Take 1,200 mg by mouth 3 (three) times daily.     [provider]  glucosamine-chondroitin 500-400 MG tablet Take 1 tablet by mouth daily.    [provider]  ibuprofen (ADVIL,MOTRIN) 200 MG tablet Take 200 mg by mouth every 6 (six) hours as needed.    [provider]  ibuprofen (ADVIL,MOTRIN) 600 MG tablet Take 1 tablet (600 mg total) every 8 (eight) hours as needed by mouth. 05/24/17   Merrily Brittle, MD  insulin NPH Human (HUMULIN N) 100 UNIT/ML injection Inject 0.4 mLs (40 Units total) into the skin at bedtime. Patient not taking: Reported on  05/09/2017 05/02/16   [name], MD  insulin NPH-regular Human (NOVOLIN 70/30) (70-30) 100 UNIT/ML injection 20-30 units [place] before breakfast and dinner  Check glucose prior to usage Patient taking differently: Inject 32 Units into the skin 2 (two) times daily with a meal.  09/13/16   Schaevitz, Myra Rude, MD  ketorolac (TORADOL) 10 MG tablet Take 1 tablet (10 mg total) by mouth every 8 (eight) hours as needed. Patient not taking: Reported on 05/09/2017 05/12/15   [name], MD  lisinopril-hydrochlorothiazide (PRINZIDE,ZESTORETIC) 10-12.5 MG tablet Take 1 tablet by mouth daily.    [provider]  LORazepam (ATIVAN) 0.5 MG tablet Take 1 tablet (0.5 mg total) by mouth every 8 (eight) hours as needed for anxiety. Patient not taking: Reported on 05/09/2017 03/12/15   Darien Ramus, MD  meloxicam (MOBIC) 15 MG tablet Take 1 tablet (15 mg total) by mouth daily. Patient not taking: Reported on 05/09/2017 10/20/15   Kem Boroughs B, FNP  metFORMIN (GLUCOPHAGE) 500 MG tablet Take 500 mg by mouth every evening.     [provider]  methadone (DOLOPHINE) 5 MG/5ML solution Take 120 mg by mouth daily.     [provider]  Multiple Vitamin (MULTIVITAMIN WITH MINERALS) TABS tablet Take 1 tablet by mouth daily.    [provider]  ondansetron (ZOFRAN) 4 MG tablet Take 1 tablet (4 mg total) by mouth every 8 (eight) hours as needed for nausea or vomiting. Patient not taking: Reported on 05/09/2017 10/27/15   Phineas Semen, MD  ondansetron [facility]) 4 MG tablet Take 1 tablet (4 mg total) by mouth daily as needed for nausea or vomiting. 06/24/17 06/24/18  Willy Eddy, MD  oxyCODONE-acetaminophen (PERCOCET) 7.5-325 MG tablet Take 1 tablet by mouth every 6 (six) hours as needed for severe pain. Patient not taking: Reported on 05/09/2017 12/10/16   Joni Reining, PA-C  promethazine (PHENERGAN) 12.5 MG tablet Take 1 tablet (12.5 mg total) by mouth every 6  (six) hours as needed for nausea or vomiting. Patient not taking: Reported on 05/09/2017 02/15/16   Willy Eddy, MD  vitamin E 400 UNIT capsule Take 400 Units by mouth daily.    [provider]    Allergies Ultram [tramadol hcl]  No family history on file.  Social History Social History   Tobacco Use  . Smoking status: Current Every Day Smoker    Packs/day: 0.50    Types: Cigarettes  . Smokeless tobacco: Never Used  . Tobacco comment: 30+ years  Substance Use Topics  . Alcohol use: No    Comment: Hasn't had a drink since 1998  . Drug use: Yes    Comment: Previously abused pain pills. prescribed methadone     Review of Systems Constitutional: No fever/chills Eyes: No visual changes. ENT: No sore throat.  Cardiovascular: Denies chest pain but has had intermittent palpitations.  She has been seen at least twice in the past for this. Respiratory: Denies shortness of breath. Gastrointestinal: No abdominal pain.  No nausea, no vomiting.  No diarrhea.  No constipation. Genitourinary: Negative for dysuria. Musculoskeletal: Negative for neck pain.  Negative for back pain. Integumentary: Negative for rash. Neurological: Negative for headaches, focal weakness or numbness.   ____________________________________________   PHYSICAL EXAM:  VITAL SIGNS: ED Triage Vitals [01/20/18 1737]  Enc Vitals Group     BP (!) 142/91     Pulse Rate (!) 105     Resp 16     Temp 98.6 F (37 C)     Temp Source Oral     SpO2 96 %     Weight 59 kg (130 lb)     Height 1.549 m (5\' 1" )     Head Circumference      Peak Flow      Pain Score 8     Pain Loc      Pain Edu?      Excl. in GC?     Constitutional: Alert and oriented. Well appearing and in no acute distress. Eyes: Conjunctivae are normal.  Head: Atraumatic. Nose: No congestion/rhinnorhea. Mouth/Throat: Mucous membranes are moist. Neck: No stridor.  No meningeal signs.   Cardiovascular: Borderline tachycardia,  regular rhythm. Good peripheral circulation. Grossly normal heart sounds. Respiratory: Normal respiratory effort.  No retractions. Lungs CTAB. Gastrointestinal: Soft and nontender. No distention.  Musculoskeletal: No lower extremity tenderness nor edema. No gross deformities of extremities. Neurologic:  Normal speech and language. No gross focal neurologic deficits are appreciated.  Skin:  Skin is warm, dry and intact. No rash noted. Psychiatric: Mood and affect are normal. Speech and behavior are normal.  ____________________________________________   LABS (all labs ordered are listed, but only abnormal results are displayed)  Labs Reviewed  BASIC METABOLIC PANEL - Abnormal; Notable for the following components:      Result Value   Chloride 96 (*)    Glucose, Bld 398 (*)    Anion gap 17 (*)    All other components within normal limits  CBC - Abnormal; Notable for the following components:   RBC 6.10 (*)    Hemoglobin 17.8 (*)    HCT 52.1 (*)    All other components within normal limits  TROPONIN I  POC URINE PREG, ED   ____________________________________________  EKG  ED ECG REPORT I, Loleta Rose, the attending physician, personally viewed and interpreted this ECG.  Date: 01/20/2018 EKG Time: 17: 39 Rate: 100 Rhythm: Borderline sinus tachycardia QRS Axis: normal Intervals: normal ST/T Wave abnormalities: Non-specific ST segment / T-wave changes, but no evidence of acute ischemia. Narrative Interpretation: no evidence of acute ischemia   ____________________________________________  RADIOLOGY I, Loleta Rose, personally viewed and evaluated these images (plain radiographs) as part of my medical decision making, as well as reviewing the written report by the radiologist.  ED MD interpretation: No acute abnormalities on chest x-ray  Official radiology report(s): Dg Chest 2 View  Result Date: 01/20/2018 CLINICAL DATA:  Patient reports onset of heart palpitations  today. Reports some SOB. Denies cough or fever. Hx DM, HTN, cholecystectomy. Patient has previous hx of drug use and has been on methadone, but has not taken it in the past 3 days. Current smoker. EXAM: CHEST - 2 VIEW COMPARISON:  01/08/2018 FINDINGS: The heart size and mediastinal contours are within normal limits. Both lungs are clear.  No pleural effusion or pneumothorax. The visualized skeletal structures are unremarkable. IMPRESSION: No active cardiopulmonary disease. Electronically Signed   By: Amie Portland M.D.   On: 01/20/2018 19:05    ____________________________________________   PROCEDURES  Critical Care performed: No   Procedure(s) performed:   Procedures   ____________________________________________   INITIAL IMPRESSION / ASSESSMENT AND PLAN / ED COURSE  As part of my medical decision making, I reviewed the following data within the electronic MEDICAL RECORD NUMBER Nursing notes reviewed and incorporated, Labs reviewed , EKG interpreted , Old chart reviewed and Notes from prior ED visits    Differential diagnosis includes, but is not limited to, nonspecific palpitations, medication/drug withdrawal, SVT, metabolic or electrolyte abnormality, less likely ACS or PE.  The patient's vital signs are stable and she is in no acute distress.  She has had the symptoms in the past and has been seen at least twice previously for these sorts of issues.  She has been referred to cardiology but has not yet been able to follow-up, presumably for financial reasons.  She is not in any arrhythmia at this time.  She is concerned about her leg pain but I explained that it is chronic neuropathy and I will not be able to help with anything narcotic but I am providing her with ibuprofen 600 mg p.o., Tylenol 1000 mg p.o., and gabapentin 300 mg p.o.  I explained that we would check basic lab work and that her chest x-ray and EKG are both reassuring.  If her lab work is all within normal limits she can be  discharged to follow-up as an outpatient and she understands and agrees with plan.  Clinical Course as of Jan 21 1999  Sat Jan 20, 2018  1958 The patient's lab work was unremarkable except for a glucose of 398.  She did also have a slightly elevated anion gap but she was otherwise asymptomatic.  Unfortunately the patient eloped from the emergency department before I could share her lab results with her or give her any IV fluids.  Should she return she should have additional lab work and a liter bolus at a minimum   [CF]    Clinical Course User Index [CF] Loleta Rose, MD    ____________________________________________  FINAL CLINICAL IMPRESSION(S) / ED DIAGNOSES  Final diagnoses:  Palpitations  Chronic painful diabetic neuropathy (HCC)  Hyperglycemia     MEDICATIONS GIVEN DURING THIS VISIT:  Medications  gabapentin (NEURONTIN) capsule 300 mg ( Oral Canceled Entry 01/20/18 1832)  ibuprofen (ADVIL,MOTRIN) tablet 600 mg (600 mg Oral Given 01/20/18 1830)  acetaminophen (TYLENOL) tablet 1,000 mg (1,000 mg Oral Given 01/20/18 1830)  gabapentin (NEURONTIN) 600 MG tablet (300 mg  Given 01/20/18 1831)     ED Discharge Orders    None       Note:  This document was prepared using Dragon voice recognition software and may include unintentional dictation errors.    Loleta Rose, MD 01/20/18 2000

## 2018-01-20 NOTE — ED Notes (Signed)
Pt asking to use the BR around 1710. Pt not back to bed at this time. This RN looked in BR's, alerted Alphonzo Lemmings, MD York Cerise, first nurse, [employer], and security. Security looking in hospital at this time to verify pt not in facility. Pt left ambulatory with steady gait and no IV access.

## 2018-01-20 NOTE — ED Notes (Signed)
Pt taken to xray via wheelchair

## 2018-01-20 NOTE — ED Notes (Signed)
Pt still not present in hospital bed or in facility. Facility has been searched by security.

## 2018-01-20 NOTE — ED Triage Notes (Signed)
Pt arrives via ems from [facility] [place], pt called regarding having palpitations, states that they have gotten better at this time, c/o headache and bilat leg pain, states that she stopped her methadone 6 days ago due to the expense, pt states that she has been seen in the ER for this type of symptom before and denies being diagnosed with a cause

## 2018-01-23 ENCOUNTER — Encounter: Payer: Self-pay | Admitting: Emergency Medicine

## 2018-01-23 ENCOUNTER — Inpatient Hospital Stay
Admission: EM | Admit: 2018-01-23 | Discharge: 2018-01-31 | DRG: 637 | Disposition: A | Discharge status: Home / Self Care | Payer: Self-pay | Attending: Internal Medicine | Admitting: Internal Medicine

## 2018-01-23 ENCOUNTER — Other Ambulatory Visit: Payer: Self-pay

## 2018-01-23 DIAGNOSIS — F1110 Opioid abuse, uncomplicated: Secondary | ICD-10-CM | POA: Diagnosis present

## 2018-01-23 DIAGNOSIS — E871 Hypo-osmolality and hyponatremia: Secondary | ICD-10-CM | POA: Diagnosis present

## 2018-01-23 DIAGNOSIS — R059 Cough, unspecified: Secondary | ICD-10-CM

## 2018-01-23 DIAGNOSIS — N179 Acute kidney failure, unspecified: Secondary | ICD-10-CM | POA: Diagnosis present

## 2018-01-23 DIAGNOSIS — F1123 Opioid dependence with withdrawal: Secondary | ICD-10-CM | POA: Diagnosis present

## 2018-01-23 DIAGNOSIS — G9341 Metabolic encephalopathy: Secondary | ICD-10-CM | POA: Diagnosis present

## 2018-01-23 DIAGNOSIS — E876 Hypokalemia: Secondary | ICD-10-CM | POA: Diagnosis present

## 2018-01-23 DIAGNOSIS — R05 Cough: Secondary | ICD-10-CM

## 2018-01-23 DIAGNOSIS — N390 Urinary tract infection, site not specified: Secondary | ICD-10-CM | POA: Diagnosis present

## 2018-01-23 DIAGNOSIS — E870 Hyperosmolality and hypernatremia: Secondary | ICD-10-CM | POA: Diagnosis present

## 2018-01-23 DIAGNOSIS — M069 Rheumatoid arthritis, unspecified: Secondary | ICD-10-CM | POA: Diagnosis present

## 2018-01-23 DIAGNOSIS — E878 Other disorders of electrolyte and fluid balance, not elsewhere classified: Secondary | ICD-10-CM | POA: Diagnosis present

## 2018-01-23 DIAGNOSIS — T383X6A Underdosing of insulin and oral hypoglycemic [antidiabetic] drugs, initial encounter: Secondary | ICD-10-CM | POA: Diagnosis present

## 2018-01-23 DIAGNOSIS — E1110 Type 2 diabetes mellitus with ketoacidosis without coma: Principal | ICD-10-CM | POA: Diagnosis present

## 2018-01-23 DIAGNOSIS — E1142 Type 2 diabetes mellitus with diabetic polyneuropathy: Secondary | ICD-10-CM | POA: Diagnosis present

## 2018-01-23 DIAGNOSIS — I10 Essential (primary) hypertension: Secondary | ICD-10-CM | POA: Diagnosis present

## 2018-01-23 DIAGNOSIS — R410 Disorientation, unspecified: Secondary | ICD-10-CM

## 2018-01-23 DIAGNOSIS — E860 Dehydration: Secondary | ICD-10-CM | POA: Diagnosis present

## 2018-01-23 DIAGNOSIS — B954 Other streptococcus as the cause of diseases classified elsewhere: Secondary | ICD-10-CM | POA: Diagnosis present

## 2018-01-23 DIAGNOSIS — I959 Hypotension, unspecified: Secondary | ICD-10-CM | POA: Diagnosis not present

## 2018-01-23 DIAGNOSIS — G8929 Other chronic pain: Secondary | ICD-10-CM | POA: Diagnosis present

## 2018-01-23 DIAGNOSIS — E0810 Diabetes mellitus due to underlying condition with ketoacidosis without coma: Secondary | ICD-10-CM

## 2018-01-23 DIAGNOSIS — F17210 Nicotine dependence, cigarettes, uncomplicated: Secondary | ICD-10-CM | POA: Diagnosis present

## 2018-01-23 DIAGNOSIS — R4182 Altered mental status, unspecified: Secondary | ICD-10-CM

## 2018-01-23 LAB — URINALYSIS, COMPLETE (UACMP) WITH MICROSCOPIC
Bilirubin Urine: NEGATIVE
HGB URINE DIPSTICK: NEGATIVE
Ketones, ur: 20 mg/dL — AB
LEUKOCYTES UA: NEGATIVE
NITRITE: NEGATIVE
PH: 5 (ref 5.0–8.0)
Protein, ur: 30 mg/dL — AB
Specific Gravity, Urine: 1.024 (ref 1.005–1.030)

## 2018-01-23 LAB — COMPREHENSIVE METABOLIC PANEL
ALT: 20 U/L (ref 0–44)
ANION GAP: 32 — AB (ref 5–15)
AST: 17 U/L (ref 15–41)
Albumin: 3.8 g/dL (ref 3.5–5.0)
Alkaline Phosphatase: 82 U/L (ref 38–126)
BUN: 72 mg/dL — ABNORMAL HIGH (ref 6–20)
CALCIUM: 9.2 mg/dL (ref 8.9–10.3)
CHLORIDE: 97 mmol/L — AB (ref 98–111)
CO2: 9 mmol/L — ABNORMAL LOW (ref 22–32)
CREATININE: 2.18 mg/dL — AB (ref 0.44–1.00)
GFR, EST AFRICAN AMERICAN: 29 mL/min — AB (ref >60)
GFR, EST NON AFRICAN AMERICAN: 25 mL/min — AB (ref >60)
Glucose, Bld: 1064 mg/dL (ref 70–99)
Potassium: 2.6 mmol/L — CL (ref 3.5–5.1)
SODIUM: 138 mmol/L (ref 135–145)
Total Bilirubin: 2.2 mg/dL — ABNORMAL HIGH (ref 0.3–1.2)
Total Protein: 6.8 g/dL (ref 6.5–8.1)

## 2018-01-23 LAB — CBC WITH DIFFERENTIAL/PLATELET
BASOS PCT: 0 %
Basophils Absolute: 0 10*3/uL (ref 0–0.1)
EOS ABS: 0 10*3/uL (ref 0–0.7)
EOS PCT: 0 %
HCT: 53.2 % — ABNORMAL HIGH (ref 35.0–47.0)
Hemoglobin: 16.5 g/dL — ABNORMAL HIGH (ref 12.0–16.0)
Lymphocytes Relative: 5 %
Lymphs Abs: 1.2 10*3/uL (ref 1.0–3.6)
MCH: 28.6 pg (ref 26.0–34.0)
MCHC: 31.1 g/dL — ABNORMAL LOW (ref 32.0–36.0)
MCV: 91.9 fL (ref 80.0–100.0)
MONO ABS: 2.5 10*3/uL — AB (ref 0.2–0.9)
Monocytes Relative: 10 %
NEUTROS ABS: 21 10*3/uL — AB (ref 1.4–6.5)
NEUTROS PCT: 85 %
PLATELETS: 303 10*3/uL (ref 150–440)
RBC: 5.79 MIL/uL — ABNORMAL HIGH (ref 3.80–5.20)
RDW: 14.5 % (ref 11.5–14.5)
WBC: 24.7 10*3/uL — AB (ref 3.6–11.0)

## 2018-01-23 LAB — BLOOD GAS, VENOUS
Acid-base deficit: 18.9 mmol/L — ABNORMAL HIGH (ref 0.0–2.0)
Bicarbonate: 8 mmol/L — ABNORMAL LOW (ref 20.0–28.0)
FIO2: 36
O2 SAT: 45.6 %
PATIENT TEMPERATURE: 37
PCO2 VEN: 23 mmHg — AB (ref 44.0–60.0)
pH, Ven: 7.15 — CL (ref 7.250–7.430)
pO2, Ven: 34 mmHg (ref 32.0–45.0)

## 2018-01-23 MED ORDER — SODIUM CHLORIDE 0.9 % IV BOLUS
1000.0000 mL | Freq: Once | INTRAVENOUS | Status: AC
Start: 2018-01-23 — End: 2018-01-23
  Administered 2018-01-23: 1000 mL via INTRAVENOUS

## 2018-01-23 MED ORDER — POTASSIUM CHLORIDE IN NACL 40-0.9 MEQ/L-% IV SOLN
INTRAVENOUS | Status: AC
  Filled 2018-01-23: qty 1000

## 2018-01-23 MED ORDER — SODIUM CHLORIDE 0.9 % IV SOLN
Freq: Once | INTRAVENOUS | Status: AC
  Administered 2018-01-23: 23:00:00 via INTRAVENOUS

## 2018-01-23 MED ORDER — POTASSIUM CHLORIDE IN NACL 40-0.9 MEQ/L-% IV SOLN
INTRAVENOUS | Status: DC
  Administered 2018-01-23: 999 mL/h via INTRAVENOUS
  Filled 2018-01-23 (×25): qty 1000

## 2018-01-23 NOTE — ED Triage Notes (Signed)
Ems pt emergency traffic , unresponsive , hx of drug abuse on methadone, 1 of Narcan give enroute with an increase responsiveness. CBG 539 , ST 140s. Decrease in health condition

## 2018-01-23 NOTE — ED Notes (Signed)
Pt able to follow commands and is alert to self and situation. Pt reports she has not taken her insulin recently. Reports low back pain.

## 2018-01-23 NOTE — ED Notes (Signed)
Date and time results received: 01/23/18 2315  Test: Glucose  Critical Value: 1064 Test: Potassium  Critical Value: 2.6   Name of Provider Notified: Dr. Derrill Kay

## 2018-01-24 ENCOUNTER — Inpatient Hospital Stay: Payer: Self-pay

## 2018-01-24 DIAGNOSIS — R410 Disorientation, unspecified: Secondary | ICD-10-CM

## 2018-01-24 DIAGNOSIS — E0810 Diabetes mellitus due to underlying condition with ketoacidosis without coma: Secondary | ICD-10-CM

## 2018-01-24 DIAGNOSIS — F1110 Opioid abuse, uncomplicated: Secondary | ICD-10-CM | POA: Diagnosis present

## 2018-01-24 DIAGNOSIS — R4182 Altered mental status, unspecified: Secondary | ICD-10-CM

## 2018-01-24 LAB — BASIC METABOLIC PANEL
Anion gap: 19 — ABNORMAL HIGH (ref 5–15)
Anion gap: 18 — ABNORMAL HIGH (ref 5–15)
Anion gap: 10 (ref 5–15)
Anion gap: 11 (ref 5–15)
Anion gap: 9 (ref 5–15)
BUN: 66 mg/dL — ABNORMAL HIGH (ref 6–20)
BUN: 66 mg/dL — ABNORMAL HIGH (ref 6–20)
BUN: 63 mg/dL — ABNORMAL HIGH (ref 6–20)
BUN: 59 mg/dL — ABNORMAL HIGH (ref 6–20)
BUN: 58 mg/dL — ABNORMAL HIGH (ref 6–20)
BUN: 50 mg/dL — ABNORMAL HIGH (ref 6–20)
CALCIUM: 7.8 mg/dL — AB (ref 8.9–10.3)
CALCIUM: 7.9 mg/dL — AB (ref 8.9–10.3)
CALCIUM: 8.5 mg/dL — AB (ref 8.9–10.3)
CALCIUM: 8.5 mg/dL — AB (ref 8.9–10.3)
CALCIUM: 8.8 mg/dL — AB (ref 8.9–10.3)
CO2: 9 mmol/L — ABNORMAL LOW (ref 22–32)
CO2: 10 mmol/L — AB (ref 22–32)
CO2: 15 mmol/L — ABNORMAL LOW (ref 22–32)
CO2: 17 mmol/L — ABNORMAL LOW (ref 22–32)
CO2: 16 mmol/L — AB (ref 22–32)
CO2: 18 mmol/L — ABNORMAL LOW (ref 22–32)
CREATININE: 1.5 mg/dL — AB (ref 0.44–1.00)
CREATININE: 1.24 mg/dL — AB (ref 0.44–1.00)
CREATININE: 0.92 mg/dL (ref 0.44–1.00)
CREATININE: 0.93 mg/dL (ref 0.44–1.00)
CREATININE: 0.8 mg/dL (ref 0.44–1.00)
Calcium: 9 mg/dL (ref 8.9–10.3)
Chloride: 118 mmol/L — ABNORMAL HIGH (ref 98–111)
Chloride: 123 mmol/L — ABNORMAL HIGH (ref 98–111)
Chloride: 128 mmol/L — ABNORMAL HIGH (ref 98–111)
Chloride: >130 mmol/L (ref 98–111)
Chloride: 125 mmol/L — ABNORMAL HIGH (ref 98–111)
Chloride: 123 mmol/L — ABNORMAL HIGH (ref 98–111)
Creatinine, Ser: 1.64 mg/dL — ABNORMAL HIGH (ref 0.44–1.00)
GFR calc Af Amer: 41 mL/min — ABNORMAL LOW (ref >60)
GFR calc Af Amer: >60 mL/min (ref >60)
GFR calc Af Amer: >60 mL/min (ref >60)
GFR calc non Af Amer: 35 mL/min — ABNORMAL LOW (ref >60)
GFR calc non Af Amer: 49 mL/min — ABNORMAL LOW (ref >60)
GFR calc non Af Amer: >60 mL/min (ref >60)
GFR calc non Af Amer: >60 mL/min (ref >60)
GFR calc non Af Amer: >60 mL/min (ref >60)
GFR, EST AFRICAN AMERICAN: 45 mL/min — AB (ref >60)
GFR, EST AFRICAN AMERICAN: 57 mL/min — AB (ref >60)
GFR, EST NON AFRICAN AMERICAN: 39 mL/min — AB (ref >60)
GLUCOSE: 744 mg/dL — AB (ref 70–99)
GLUCOSE: 239 mg/dL — AB (ref 70–99)
GLUCOSE: 364 mg/dL — AB (ref 70–99)
GLUCOSE: 357 mg/dL — AB (ref 70–99)
Glucose, Bld: 594 mg/dL (ref 70–99)
Glucose, Bld: 406 mg/dL — ABNORMAL HIGH (ref 70–99)
Potassium: 4.3 mmol/L (ref 3.5–5.1)
Potassium: 3.4 mmol/L — ABNORMAL LOW (ref 3.5–5.1)
Potassium: 2.7 mmol/L — CL (ref 3.5–5.1)
Potassium: 3.3 mmol/L — ABNORMAL LOW (ref 3.5–5.1)
Potassium: 4 mmol/L (ref 3.5–5.1)
Potassium: 3.2 mmol/L — ABNORMAL LOW (ref 3.5–5.1)
SODIUM: 151 mmol/L — AB (ref 135–145)
Sodium: 146 mmol/L — ABNORMAL HIGH (ref 135–145)
Sodium: 153 mmol/L — ABNORMAL HIGH (ref 135–145)
Sodium: 153 mmol/L — ABNORMAL HIGH (ref 135–145)
Sodium: 152 mmol/L — ABNORMAL HIGH (ref 135–145)
Sodium: 150 mmol/L — ABNORMAL HIGH (ref 135–145)

## 2018-01-24 LAB — GLUCOSE, CAPILLARY
GLUCOSE-CAPILLARY: 461 mg/dL — AB (ref 70–99)
GLUCOSE-CAPILLARY: 289 mg/dL — AB (ref 70–99)
GLUCOSE-CAPILLARY: 261 mg/dL — AB (ref 70–99)
GLUCOSE-CAPILLARY: 326 mg/dL — AB (ref 70–99)
Glucose-Capillary: 561 mg/dL (ref 70–99)
Glucose-Capillary: 258 mg/dL — ABNORMAL HIGH (ref 70–99)
Glucose-Capillary: 217 mg/dL — ABNORMAL HIGH (ref 70–99)
Glucose-Capillary: 205 mg/dL — ABNORMAL HIGH (ref 70–99)
Glucose-Capillary: 237 mg/dL — ABNORMAL HIGH (ref 70–99)
Glucose-Capillary: 265 mg/dL — ABNORMAL HIGH (ref 70–99)

## 2018-01-24 LAB — CBC
HCT: 45.6 % (ref 35.0–47.0)
Hemoglobin: 15.3 g/dL (ref 12.0–16.0)
MCH: 29.1 pg (ref 26.0–34.0)
MCHC: 33.5 g/dL (ref 32.0–36.0)
MCV: 86.9 fL (ref 80.0–100.0)
PLATELETS: 172 10*3/uL (ref 150–440)
RBC: 5.24 MIL/uL — ABNORMAL HIGH (ref 3.80–5.20)
RDW: 13.6 % (ref 11.5–14.5)
WBC: 21.6 10*3/uL — ABNORMAL HIGH (ref 3.6–11.0)

## 2018-01-24 LAB — LACTIC ACID, PLASMA: Lactic Acid, Venous: 1.6 mmol/L (ref 0.5–1.9)

## 2018-01-24 LAB — URINE DRUG SCREEN, QUALITATIVE (ARMC ONLY)
AMPHETAMINES, UR SCREEN: NOT DETECTED
BENZODIAZEPINE, UR SCRN: NOT DETECTED
Cannabinoid 50 Ng, Ur ~~LOC~~: NOT DETECTED
Cocaine Metabolite,Ur ~~LOC~~: NOT DETECTED
MDMA (Ecstasy)Ur Screen: NOT DETECTED
Methadone Scn, Ur: POSITIVE — AB
OPIATE, UR SCREEN: NOT DETECTED
PHENCYCLIDINE (PCP) UR S: NOT DETECTED
Tricyclic, Ur Screen: NOT DETECTED

## 2018-01-24 LAB — PROCALCITONIN: Procalcitonin: 1.49 ng/mL

## 2018-01-24 LAB — MRSA PCR SCREENING: MRSA BY PCR: NEGATIVE

## 2018-01-24 LAB — MAGNESIUM: MAGNESIUM: 2.4 mg/dL (ref 1.7–2.4)

## 2018-01-24 MED ORDER — SODIUM CHLORIDE 0.9 % IV SOLN
INTRAVENOUS | Status: DC
  Filled 2018-01-24 (×2): qty 1

## 2018-01-24 MED ORDER — INSULIN ASPART 100 UNIT/ML ~~LOC~~ SOLN
0.0000 [IU] | Freq: Every day | SUBCUTANEOUS | Status: DC
  Administered 2018-01-24: 3 [IU] via SUBCUTANEOUS
  Filled 2018-01-24: qty 1

## 2018-01-24 MED ORDER — NICOTINE 14 MG/24HR TD PT24
14.0000 mg | MEDICATED_PATCH | Freq: Every day | TRANSDERMAL | Status: DC
  Administered 2018-01-25 – 2018-01-31 (×7): 14 mg via TRANSDERMAL
  Filled 2018-01-24 (×7): qty 1

## 2018-01-24 MED ORDER — METOPROLOL TARTRATE 5 MG/5ML IV SOLN
2.5000 mg | Freq: Once | INTRAVENOUS | Status: AC
  Administered 2018-01-24: 2.5 mg via INTRAVENOUS
  Filled 2018-01-24: qty 5

## 2018-01-24 MED ORDER — SODIUM CHLORIDE 0.9 % IV SOLN
INTRAVENOUS | Status: DC
  Administered 2018-01-24: 999 mL via INTRAVENOUS

## 2018-01-24 MED ORDER — DEXTROSE 5 % IV SOLN
INTRAVENOUS | Status: DC
  Administered 2018-01-24: 11:00:00 via INTRAVENOUS

## 2018-01-24 MED ORDER — INSULIN REGULAR HUMAN 100 UNIT/ML IJ SOLN
INTRAMUSCULAR | Status: DC
  Administered 2018-01-24: 5.4 [IU]/h via INTRAVENOUS
  Filled 2018-01-24: qty 1

## 2018-01-24 MED ORDER — DEXTROSE-NACL 5-0.45 % IV SOLN
INTRAVENOUS | Status: DC
  Administered 2018-01-24: 10:00:00 via INTRAVENOUS

## 2018-01-24 MED ORDER — LISINOPRIL 5 MG PO TABS
10.0000 mg | ORAL_TABLET | Freq: Every day | ORAL | Status: DC
  Administered 2018-01-24: 10 mg via ORAL
  Filled 2018-01-24: qty 2

## 2018-01-24 MED ORDER — SODIUM CHLORIDE 0.45 % IV SOLN
INTRAVENOUS | Status: DC
  Administered 2018-01-25 (×2): via INTRAVENOUS

## 2018-01-24 MED ORDER — POTASSIUM CHLORIDE 10 MEQ/100ML IV SOLN
10.0000 meq | INTRAVENOUS | Status: DC
  Filled 2018-01-24 (×4): qty 100

## 2018-01-24 MED ORDER — ACETAMINOPHEN 325 MG PO TABS
650.0000 mg | ORAL_TABLET | Freq: Four times a day (QID) | ORAL | Status: DC | PRN
  Administered 2018-01-24 – 2018-01-29 (×3): 650 mg via ORAL
  Filled 2018-01-24 (×3): qty 2

## 2018-01-24 MED ORDER — INSULIN ASPART PROT & ASPART (70-30 MIX) 100 UNIT/ML ~~LOC~~ SUSP: 25.0000 [IU] | Freq: Two times a day (BID) | SUBCUTANEOUS | Status: DC

## 2018-01-24 MED ORDER — HYDROCHLOROTHIAZIDE 12.5 MG PO CAPS
12.5000 mg | ORAL_CAPSULE | Freq: Every day | ORAL | Status: DC
  Administered 2018-01-24: 12.5 mg via ORAL
  Filled 2018-01-24 (×2): qty 1

## 2018-01-24 MED ORDER — INSULIN ASPART 100 UNIT/ML ~~LOC~~ SOLN
0.0000 [IU] | Freq: Three times a day (TID) | SUBCUTANEOUS | Status: DC
  Administered 2018-01-24: 11 [IU] via SUBCUTANEOUS
  Administered 2018-01-24: 5 [IU] via SUBCUTANEOUS
  Administered 2018-01-25: 8 [IU] via SUBCUTANEOUS
  Administered 2018-01-25: 11 [IU] via SUBCUTANEOUS
  Filled 2018-01-24 (×4): qty 1

## 2018-01-24 MED ORDER — HYDRALAZINE HCL 20 MG/ML IJ SOLN
10.0000 mg | INTRAMUSCULAR | Status: DC | PRN
  Administered 2018-01-24: 20 mg via INTRAVENOUS
  Filled 2018-01-24: qty 1

## 2018-01-24 MED ORDER — INSULIN ASPART PROT & ASPART (70-30 MIX) 100 UNIT/ML ~~LOC~~ SUSP
25.0000 [IU] | Freq: Two times a day (BID) | SUBCUTANEOUS | Status: DC
  Administered 2018-01-24 (×2): 25 [IU] via SUBCUTANEOUS
  Filled 2018-01-24 (×2): qty 10

## 2018-01-24 MED ORDER — FAMOTIDINE 20 MG PO TABS
20.0000 mg | ORAL_TABLET | Freq: Two times a day (BID) | ORAL | Status: DC
  Administered 2018-01-24 – 2018-01-31 (×15): 20 mg via ORAL
  Filled 2018-01-24 (×15): qty 1

## 2018-01-24 MED ORDER — SODIUM CHLORIDE 0.9 % IV SOLN
INTRAVENOUS | Status: DC
  Administered 2018-01-24: 03:00:00 via INTRAVENOUS

## 2018-01-24 MED ORDER — PREMIER PROTEIN SHAKE
11.0000 [oz_av] | Freq: Two times a day (BID) | ORAL | Status: DC
  Administered 2018-01-24 – 2018-01-31 (×11): 11 [oz_av] via ORAL

## 2018-01-24 MED ORDER — SODIUM CHLORIDE 0.45 % IV SOLN
INTRAVENOUS | Status: DC
  Administered 2018-01-24: 19:00:00 via INTRAVENOUS

## 2018-01-24 MED ORDER — HEPARIN SODIUM (PORCINE) 5000 UNIT/ML IJ SOLN
5000.0000 [IU] | Freq: Three times a day (TID) | INTRAMUSCULAR | Status: DC
  Administered 2018-01-24 (×3): 5000 [IU] via SUBCUTANEOUS
  Filled 2018-01-24 (×4): qty 1

## 2018-01-24 MED ORDER — POTASSIUM CHLORIDE 10 MEQ/100ML IV SOLN
10.0000 meq | INTRAVENOUS | Status: AC
  Administered 2018-01-24 (×4): 10 meq via INTRAVENOUS
  Filled 2018-01-24 (×4): qty 100

## 2018-01-24 NOTE — Progress Notes (Addendum)
Inpatient Diabetes Program Recommendations  AACE/ADA: New Consensus Statement on Inpatient Glycemic Control (2019)  Target Ranges:  Prepandial:   less than 140 mg/dL      Peak postprandial:   less than 180 mg/dL (1-2 hours)      Critically ill patients:  140 - 180 mg/dL   Results for [name], [name] (MRN [id number]) as of 01/24/2018 07:21  Ref. Range 01/24/2018 02:09 01/24/2018 03:46 01/24/2018 04:57 01/24/2018 06:05 01/24/2018 07:06  Glucose-Capillary Latest Ref Range: 70 - 99 mg/dL >782 (HH) 956 (HH) 213 (H) 289 (H) 261 (H)  Results for Karen Lindsey, Karen Lindsey (MRN [id number]) as of 01/24/2018 07:21  Ref. Range 01/23/2018 22:17  Glucose Latest Ref Range: 70 - 99 mg/dL 6,295 [facility])   Results for [name], [name] (MRN [id number]) as of 01/24/2018 07:21  Ref. Range 11/08/2017 22:39  Hemoglobin A1C Latest Ref Range: 4.8 - 5.6 % 11.4 (H)  Results for [name], [name] (MRN [id number]) as of 01/24/2018 11:17  Ref. Range 01/24/2018 06:25  CO2 Latest Ref Range: 22 - 32 mmol/L 15 (L)  Results for [name], [name] (MRN [id number]) as of 01/24/2018 11:17  Ref. Range 01/24/2018 06:25  Anion gap Latest Ref Range: 5 - 15  10   Review of Glycemic Control  Diabetes history: DM2 Outpatient Diabetes medications: Invokana 100 mg daily, 70/30 32 units BID, Metformin 500 mg QPM Current orders for Inpatient glycemic control: IV insulin drip per DKA protocol  Inpatient Diabetes Program Recommendations:  Insulin - IV drip/GlucoStabilizer: Admitted with DKA and currently on IV insulin which will be continued until acidosis is cleared. HgbA1C: A1C 11.4% on 11/08/17 indicating an average glucose of 280 mg/dl over the past 2-3 months.  Inpatient Diabetes Coordinator spoke with patient on 11/09/17 during last hospital admission regarding DM control.  NOTE: Noted consult for Diabetes Coordinator. Chart reviewed. Inpatient Diabetes Coordinator spoke with patient on 11/09/17 during last hospital admission. Patient currently ordered IV insulin for DKA  which will be continued until acidosis is resolved. Will continue to follow.  Addendum: Noted orders for transition from IV to SQ insulin. Insulin drip rate has ranged from 6.9-9.9 units/hour over the prior 4 hours. Insulin drip was discontinued at 10:49am and no SQ insulin has been given yet. IV insulin has half-life of 5-7 minutes so patient will require SQ insulin be given if transitioning off IV insulin.  Patient has 70/30 25 units BID ordered to be started at 5pm today along with Novolog 0-15 units TID with meals and Novolog 0-5 units QHS. Called unit and spoke with Sunny Schlein, RN and talked with Dr. Sherryll Burger. Per Dr. Sherryll Burger patient will transition from IV to SQ insulin at this time and 70/30 insulin will be started now. Talked with Sunny Schlein, RN to make aware that 70/30 insulin order should be modified to start now instead of at 5pm today. Will continue to follow.  Thanks, Orlando Penner, RN, MSN, CDE Diabetes Coordinator Inpatient Diabetes Program [phone] (Team Pager from 8am to 5pm)

## 2018-01-24 NOTE — ED Notes (Signed)
Date and time results received: 01/24/18 1:26 AM (use smartphrase ".now" to insert current time)  Test: Glucose Critical Value: Glu: 744  Name of Provider Notified: Dr. Dolores Frame

## 2018-01-24 NOTE — Consult Note (Signed)
Name: Karen Lindsey MRN: [id number] DOB: Jan 04, 1966    ADMISSION DATE:  01/23/2018 CONSULTATION DATE: 01/24/2018  REFERRING MD : Dr. Anne Hahn   CHIEF COMPLAINT: Altered Mental Status   BRIEF PATIENT DESCRIPTION:  52 yo female admitted with acute renal failure and acute encephalopathy secondary to possible narcotic use and DKA requiring insulin gtt  SIGNIFICANT EVENTS/STUDIES:  07/16 Pt admitted to ICU   HISTORY OF PRESENT ILLNESS:   This is a 52 yo female with a PMH of Tobacco Abuse, Rheumatoid Arthritis, Diabetic Peripheral Neuropathy, Narcotic Addiction (currently on methadone), Insulin Dependent Diabetes Mellitus, HTN, Fatty Liver, and Chronic Pain.  She presented to [facility] ER on 07/16 via EMS with unresponsiveness and hyperglycemia CBG 539.  Per ER notes en route to the ER she received iv narcan x1 dose with improvement in mentation.  Upon arrival to the ER she was alert and able to follow commands.  She reported she has been noncompliant with taking her insulin and she reported low back pain.  Lab results ruled pt in for DKA, therefore DKA protocol initiated.  She was subsequently admitted to ICU by hospitalist team for further workup and treatment.   PAST MEDICAL HISTORY :   has a past medical history of Arthritis, Chronic pain, Diabetic peripheral neuropathy (HCC), Fatty liver, Hypertension, Insulin dependent diabetes mellitus (HCC), Narcotic addiction (HCC), Neuropathy, RA (rheumatoid arthritis) (HCC), and Tobacco abuse.  has a past surgical history that includes Knee surgery; Abdominal hysterectomy; Cesarean section; Mandible fracture surgery; and Cholecystectomy. Prior to Admission medications   Medication Sig Start Date End Date Taking? Authorizing Provider  baclofen (LIORESAL) 10 MG tablet Take 1 tablet (10 mg total) by mouth 3 (three) times daily. Patient not taking: Reported on 05/09/2017 10/20/15   Kem Boroughs B, FNP  benzonatate (TESSALON PERLES) 100 MG capsule Take 1  capsule (100 mg total) by mouth every 6 (six) hours as needed for cough. 08/06/17 08/06/18  Jeanmarie Plant, MD  Calcium Carb-Ergocalciferol 500-200 MG-UNIT TABS Take 1 tablet by mouth daily.    [provider]  canagliflozin (INVOKANA) 100 MG TABS tablet Take 100 mg by mouth daily. 02/24/17   [provider]  cephALEXin (KEFLEX) 500 MG capsule Take 1 capsule (500 mg total) by mouth every 12 (twelve) hours. 11/09/17   Salary, Evelena Asa, MD  cyclobenzaprine (FLEXERIL) 5 MG tablet Take 1 tablet (5 mg total) by mouth 3 (three) times daily as needed for muscle spasms. Patient not taking: Reported on 05/09/2017 11/02/16   Menshew, Charlesetta Ivory, PA-C  diclofenac (VOLTAREN) 50 MG EC tablet Take 1 tablet (50 mg total) by mouth 2 (two) times daily. Patient not taking: Reported on 05/09/2017 11/02/16   Menshew, Charlesetta Ivory, PA-C  DOCOSAHEXAENOIC ACID PO Take 1 capsule by mouth daily.    [provider]  famotidine (PEPCID) 20 MG tablet Take 1 tablet (20 mg total) by mouth 2 (two) times daily. 09/15/17 09/15/18  Merrily Brittle, MD  fluconazole (DIFLUCAN) 150 MG tablet Take 1 tablet (150 mg total) by mouth daily. 11/10/17   Salary, Evelena Asa, MD  gabapentin (NEURONTIN) 300 MG capsule Take 1,200 mg by mouth 3 (three) times daily.     [provider]  glucosamine-chondroitin 500-400 MG tablet Take 1 tablet by mouth daily.    [provider]  ibuprofen (ADVIL,MOTRIN) 200 MG tablet Take 200 mg by mouth every 6 (six) hours as needed.    [provider]  ibuprofen (ADVIL,MOTRIN) 600 MG tablet Take  1 tablet (600 mg total) every 8 (eight) hours as needed by mouth. 05/24/17   Merrily Brittle, MD  insulin NPH Human (HUMULIN N) 100 UNIT/ML injection Inject 0.4 mLs (40 Units total) into the skin at bedtime. Patient not taking: Reported on 05/09/2017 05/02/16   [name], MD  insulin NPH-regular Human (NOVOLIN 70/30) (70-30) 100 UNIT/ML injection 20-30 units [place] before  breakfast and dinner  Check glucose prior to usage Patient taking differently: Inject 32 Units into the skin 2 (two) times daily with a meal.  09/13/16   Schaevitz, Myra Rude, MD  ketorolac (TORADOL) 10 MG tablet Take 1 tablet (10 mg total) by mouth every 8 (eight) hours as needed. Patient not taking: Reported on 05/09/2017 05/12/15   [name], MD  lisinopril-hydrochlorothiazide (PRINZIDE,ZESTORETIC) 10-12.5 MG tablet Take 1 tablet by mouth daily.    [provider]  LORazepam (ATIVAN) 0.5 MG tablet Take 1 tablet (0.5 mg total) by mouth every 8 (eight) hours as needed for anxiety. Patient not taking: Reported on 05/09/2017 03/12/15   Darien Ramus, MD  meloxicam (MOBIC) 15 MG tablet Take 1 tablet (15 mg total) by mouth daily. Patient not taking: Reported on 05/09/2017 10/20/15   Kem Boroughs B, FNP  metFORMIN (GLUCOPHAGE) 500 MG tablet Take 500 mg by mouth every evening.     [provider]  Multiple Vitamin (MULTIVITAMIN WITH MINERALS) TABS tablet Take 1 tablet by mouth daily.    [provider]  ondansetron (ZOFRAN) 4 MG tablet Take 1 tablet (4 mg total) by mouth every 8 (eight) hours as needed for nausea or vomiting. Patient not taking: Reported on 05/09/2017 10/27/15   Phineas Semen, MD  ondansetron [facility]) 4 MG tablet Take 1 tablet (4 mg total) by mouth daily as needed for nausea or vomiting. 06/24/17 06/24/18  Willy Eddy, MD  oxyCODONE-acetaminophen (PERCOCET) 7.5-325 MG tablet Take 1 tablet by mouth every 6 (six) hours as needed for severe pain. Patient not taking: Reported on 05/09/2017 12/10/16   Joni Reining, PA-C  promethazine (PHENERGAN) 12.5 MG tablet Take 1 tablet (12.5 mg total) by mouth every 6 (six) hours as needed for nausea or vomiting. Patient not taking: Reported on 05/09/2017 02/15/16   Willy Eddy, MD  vitamin E 400 UNIT capsule Take 400 Units by mouth daily.    [provider]   Allergies  Allergen  Reactions  . Ultram [Tramadol Hcl] Palpitations    FAMILY HISTORY:  family history is not on file. SOCIAL HISTORY:  reports that she has been smoking cigarettes.  She has been smoking about 0.50 packs per day. She has never used smokeless tobacco. She reports that she has current or past drug history. She reports that she does not drink alcohol.  REVIEW OF SYSTEMS:  Unable to assess pt confused   SUBJECTIVE:  Unable to assess pt confused   VITAL SIGNS: Temp:  [98.4 F (36.9 C)] 98.4 F (36.9 C) (07/16 2216) Pulse Rate:  [124-140] 124 (07/17 0000) Resp:  [20-28] 26 (07/17 0000) BP: (110-127)/(70-75) 127/70 (07/17 0000) SpO2:  [100 %] 100 % (07/17 0000) Weight:  [59 kg (130 lb)] 59 kg (130 lb) (07/16 2216)  PHYSICAL EXAMINATION: General: acutely ill appearing female resting in bed  Neuro: lethargic and confused, follows commands, bilateral pupils dilated 4 mm and reactive  HEENT: supple, mild JVD  Cardiovascular: sinus tach, no R/G Lungs: clear throughout, tachypneic  Abdomen: +BS x4, obese, soft, non tender, non distended  Musculoskeletal: normal bulk  and tone, no edema  Skin: intact no rashes or lesions   Recent Labs  Lab 01/20/18 1751 01/23/18 2217  NA 135 138  K 4.2 2.6*  CL 96* 97*  CO2 22 9*  BUN 18 72*  CREATININE 0.85 2.18*  GLUCOSE 398* 1,064*   Recent Labs  Lab 01/20/18 1751 01/23/18 2217  HGB 17.8* 16.5*  HCT 52.1* 53.2*  WBC 10.3 24.7*  PLT 198 303   No results found.  ASSESSMENT / PLAN: Acute encephalopathy secondary to possible narcotic use and DKA requiring insulin gtt  Acute renal failure  Hypokalemia  Hx: Rheumatoid Arthritis, Current Everyday Smoker, HTN, and Fatty Liver P: Supplemental O2 for dyspnea and/or hypoxia  Repeat ABG Continuous telemetry monitoring  Continue DKA protocol until anion gap closed and serum CO2 >20 Diabetes coordinator consulted appreciate input  Trend BMP Replace electrolytes as indicated  Monitor UOP    Avoid nephrotoxic medications  Trend WBC and monitor fever curve  Trend PCT and lactic acid-if elevated will start empiric abx  VTE px: subq heparin  Trend CBC  Monitor for s/sx of bleeding and transfuse for hgb <7  Avoid sedating medication  CT Head and Urine Drug screen pending  Once mentation improves will need smoking cessation counseling   Sonda Rumble, AGNP  Pulmonary/Critical Care Pager [phone] (please enter 7 digits) PCCM Consult Pager [phone] (please enter 7 digits)

## 2018-01-24 NOTE — Consult Note (Signed)
[facility] Consult   Reason for Consult: Consult for 52 year old woman brought into the hospital with altered mental status Referring Physician: Bridgett Larsson Patient Identification: Karen Lindsey MRN:  [id number] Principal Diagnosis: Acute delirium Diagnosis:   Patient Active Problem List   Diagnosis Date Noted  . Narcotic abuse ([place]) [F11.10] 01/24/2018  . Acute delirium [R41.0] 01/24/2018  . DKA (diabetic ketoacidoses) ([place]) [E13.10] 01/23/2018  . AKI (acute kidney injury) ([place]) [N17.9] 01/23/2018  . HTN (hypertension) [I10] 01/23/2018  . Chest pain [R07.9] 11/08/2017    Total Time spent with patient: 1 hour  Subjective:   Karen Lindsey is a 52 y.o. female patient admitted with patient not able to give information.  HPI: Patient seen chart reviewed.  52 year old woman with limited available past history brought into the hospital evidently after EMS was called for altered mental status.  Reports are that Narcan was given at the scene with only slight if any response.  Patient was found to have elevated blood sugars and initially thought to be in diabetic ketoacidosis and was admitted to the intensive care unit.  Urine drug screen positive for methadone.  Patient has a past history of enrollment in a methadone clinic but it is unknown whether she is still involved or not.  Attempted to interview patient.  She appeared to be asleep but responded to her name by turning her head towards me and then away again.  Nodded her head a couple of times to some questions.  Shook her head "no" when I asked her if she was still enrolled at the methadone clinic.  Would not however give me any information when I asked about how she continued to get methadone or how much she was taking.  Affect was flat throughout.  Social history: Very little information.  No family has been in to visit.  The only phone number I see listed in the chart is for a "friend".  Medical history: History of chronic hand  pain and edema diabetes high blood pressure  Substance abuse history: Evidently a past history of opiate abuse details lacking.  At some point was involved in the methadone clinic.  Unclear if she is involved with that now or not.  Past Psychiatric History: There are no psychiatric notes in the chart.  The patient is not able to give me any history right now.  The little information we have is the positive drug screen and the fact that she used to be in a methadone clinic.  Currently patient is unconscious and is either unable or unwilling to communicate.  Risk to Self:   Risk to Others:   Prior Inpatient Therapy:   Prior Outpatient Therapy:    Past Medical History:  Past Medical History:  Diagnosis Date  . Arthritis    rheumatoid  . Chronic pain    a. upper/mid back.  . Diabetic peripheral neuropathy ([place])   . Fatty liver   . Hypertension   . Insulin dependent diabetes mellitus ([place])    a. Dx ~ 2011.  . Narcotic addiction ([place])    a. Followed in substance abuse center in [place] - on chronic methadone.  . Neuropathy   . RA (rheumatoid arthritis) ([place])   . Tobacco abuse     Past Surgical History:  Procedure Laterality Date  . ABDOMINAL HYSTERECTOMY    . CESAREAN SECTION    . CHOLECYSTECTOMY    . KNEE SURGERY    . MANDIBLE FRACTURE SURGERY     Family  History: No family history on file. Family Psychiatric  History: No information available Social History:  Social History   Substance and Sexual Activity  Alcohol Use No   Comment: Hasn't had a drink since 1998     Social History   Substance and Sexual Activity  Drug Use Yes   Comment: Previously abused pain pills. prescribed methadone     Social History   Socioeconomic History  . Marital status: Legally Separated    Spouse name: Not on file  . Number of children: Not on file  . Years of education: Not on file  . Highest education level: Not on file  Occupational History    Comment: Works part-time in Control and instrumentation engineer  Social Needs  . Financial resource strain: Not on file  . Food insecurity:    Worry: Not on file    Inability: Not on file  . Transportation needs:    Medical: Not on file    Non-medical: Not on file  Tobacco Use  . Smoking status: Current Every Day Smoker    Packs/day: 0.50    Types: Cigarettes  . Smokeless tobacco: Never Used  . Tobacco comment: 30+ years  Substance and Sexual Activity  . Alcohol use: No    Comment: Hasn't had a drink since 1998  . Drug use: Yes    Comment: Previously abused pain pills. prescribed methadone   . Sexual activity: Not on file  Lifestyle  . Physical activity:    Days per week: Not on file    Minutes per session: Not on file  . Stress: Not on file  Relationships  . Social connections:    Talks on phone: Not on file    Gets together: Not on file    Attends religious service: Not on file    Active member of club or organization: Not on file    Attends meetings of clubs or organizations: Not on file    Relationship status: Not on file  Other Topics Concern  . Not on file  Social History Narrative   Lives locally with friends/roomates.   Additional Social History:    Allergies:   Allergies  Allergen Reactions  . Ultram [Tramadol Hcl] Palpitations    Labs:  Results for orders placed or performed during the hospital encounter of 01/23/18 (from the past 48 hour(s))  Blood gas, venous     Status: Abnormal   Collection Time: 01/23/18 10:17 PM  Result Value Ref Range   FIO2 36.00    pH, Ven 7.15 (LL) 7.250 - 7.430    Comment: CRITICAL RESULT CALLED TO, READ BACK BY AND VERIFIED WITH: DR.GOODMAN AT [phone] BY SMATHEW RRT    pCO2, Ven 23 (L) 44.0 - 60.0 mmHg   pO2, Ven 34.0 32.0 - 45.0 mmHg   Bicarbonate 8.0 (L) 20.0 - 28.0 mmol/L   Acid-base deficit 18.9 (H) 0.0 - 2.0 mmol/L   O2 Saturation 45.6 %   Patient temperature 37.0    Collection site VENOUS    Sample type VENOUS     Comment: Performed at [facility],  [address] [place],  [address]  CBC with Differential     Status: Abnormal   Collection Time: 01/23/18 10:17 PM  Result Value Ref Range   WBC 24.7 (H) 3.6 - 11.0 K/uL   RBC 5.79 (H) 3.80 - 5.20 MIL/uL   Hemoglobin 16.5 (H) 12.0 - 16.0 g/dL    Comment: RESULT REPEATED AND VERIFIED  HCT 53.2 (H) 35.0 - 47.0 %   MCV 91.9 80.0 - 100.0 fL   MCH 28.6 26.0 - 34.0 pg   MCHC 31.1 (L) 32.0 - 36.0 g/dL   RDW 14.5 11.5 - 14.5 %   Platelets 303 150 - 440 K/uL   Neutrophils Relative % 85 %   Lymphocytes Relative 5 %   Monocytes Relative 10 %   Eosinophils Relative 0 %   Basophils Relative 0 %   Neutro Abs 21.0 (H) 1.4 - 6.5 K/uL   Lymphs Abs 1.2 1.0 - 3.6 K/uL   Monocytes Absolute 2.5 (H) 0.2 - 0.9 K/uL   Eosinophils Absolute 0.0 0 - 0.7 K/uL   Basophils Absolute 0.0 0 - 0.1 K/uL   Smear Review MORPHOLOGY UNREMARKABLE     Comment: Performed at [facility], [address], [place], [place] [address]  Comprehensive metabolic panel     Status: Abnormal   Collection Time: 01/23/18 10:17 PM  Result Value Ref Range   Sodium 138 135 - 145 mmol/L    Comment: ELECTROLYTES REPEATED. MSS   Potassium 2.6 (LL) 3.5 - 5.1 mmol/L    Comment: CRITICAL RESULT CALLED TO, READ BACK BY AND VERIFIED WITH REBECCA UHORCHUK RN AT 2310 01/23/18. MSS    Chloride 97 (L) 98 - 111 mmol/L    Comment: Please note change in reference range.   CO2 9 (L) 22 - 32 mmol/L   Glucose, Bld 1,064 (HH) 70 - 99 mg/dL    Comment: CRITICAL RESULT CALLED TO, READ BACK BY AND VERIFIED WITH REBECCA UHORCHUK RN AT 2310 01/23/18. MSS Please note change in reference range.    BUN 72 (H) 6 - 20 mg/dL    Comment: Please note change in reference range.   Creatinine, Ser 2.18 (H) 0.44 - 1.00 mg/dL   Calcium 9.2 8.9 - 10.3 mg/dL   Total Protein 6.8 6.5 - 8.1 g/dL   Albumin 3.8 3.5 - 5.0 g/dL   AST 17 15 - 41 U/L   ALT 20 0 - 44 U/L    Comment: Please note change in reference range.   Alkaline Phosphatase 82 38 -  126 U/L   Total Bilirubin 2.2 (H) 0.3 - 1.2 mg/dL   GFR calc non Af Amer 25 (L) >60 mL/min   GFR calc Af Amer 29 (L) >60 mL/min    Comment: (NOTE) The eGFR has been calculated using the CKD EPI equation. This calculation has not been validated in all clinical situations. eGFR's persistently <60 mL/min signify possible Chronic Kidney Disease.    Anion gap 32 (H) 5 - 15    Comment: Performed at [facility], [address] [place], [place] [address]  Urinalysis, Complete w Microscopic     Status: Abnormal   Collection Time: 01/23/18 10:30 PM  Result Value Ref Range   Color, Urine YELLOW (A) YELLOW   APPearance CLEAR (A) CLEAR   Specific Gravity, Urine 1.024 1.005 - 1.030   pH 5.0 5.0 - 8.0   Glucose, UA >=500 (A) NEGATIVE mg/dL   Hgb urine dipstick NEGATIVE NEGATIVE   Bilirubin Urine NEGATIVE NEGATIVE   Ketones, ur 20 (A) NEGATIVE mg/dL   Protein, ur 30 (A) NEGATIVE mg/dL   Nitrite NEGATIVE NEGATIVE   Leukocytes, UA NEGATIVE NEGATIVE   RBC / HPF 0-5 0 - 5 RBC/hpf   WBC, UA 0-5 0 - 5 WBC/hpf   Bacteria, UA RARE (A) NONE SEEN   Squamous Epithelial / LPF 0-5 0 -  5   Mucus PRESENT    Hyaline Casts, UA PRESENT     Comment: Performed at [facility], [address] [place], [place] [address]  Urine Drug Screen, Qualitative [facility] only)     Status: Abnormal   Collection Time: 01/23/18 10:30 PM  Result Value Ref Range   Tricyclic, Ur Screen NONE DETECTED NONE DETECTED   Amphetamines, Ur Screen NONE DETECTED NONE DETECTED   MDMA (Ecstasy)Ur Screen NONE DETECTED NONE DETECTED   Cocaine Metabolite,Ur [place] NONE DETECTED NONE DETECTED   Opiate, Ur Screen NONE DETECTED NONE DETECTED   Phencyclidine (PCP) Ur S NONE DETECTED NONE DETECTED   Cannabinoid 50 Ng, Ur [place] NONE DETECTED NONE DETECTED   Barbiturates, Ur Screen (A) NONE DETECTED    Result not available. Reagent lot number recalled by manufacturer.   Benzodiazepine, Ur Scrn NONE DETECTED NONE DETECTED   Methadone  Scn, Ur POSITIVE (A) NONE DETECTED    Comment: (NOTE) Tricyclics + metabolites, urine    Cutoff 1000 ng/mL Amphetamines + metabolites, urine  Cutoff 1000 ng/mL MDMA (Ecstasy), urine              Cutoff 500 ng/mL Cocaine Metabolite, urine          Cutoff 300 ng/mL Opiate + metabolites, urine        Cutoff 300 ng/mL Phencyclidine (PCP), urine         Cutoff 25 ng/mL Cannabinoid, urine                 Cutoff 50 ng/mL Barbiturates + metabolites, urine  Cutoff 200 ng/mL Benzodiazepine, urine              Cutoff 200 ng/mL Methadone, urine                   Cutoff 300 ng/mL The urine drug screen provides only a preliminary, unconfirmed analytical test result and should not be used for non-medical purposes. Clinical consideration and professional judgment should be applied to any positive drug screen result due to possible interfering substances. A more specific alternate chemical method must be used in order to obtain a confirmed analytical result. Gas chromatography / mass spectrometry (GC/MS) is the preferred confirmat ory method. Performed at [facility], [address] [place], [place] [address]   Basic metabolic panel     Status: Abnormal   Collection Time: 01/24/18  1:06 AM  Result Value Ref Range   Sodium 146 (H) 135 - 145 mmol/L   Potassium 4.3 3.5 - 5.1 mmol/L   Chloride 118 (H) 98 - 111 mmol/L    Comment: Please note change in reference range.   CO2 9 (L) 22 - 32 mmol/L   Glucose, Bld 744 (HH) 70 - 99 mg/dL    Comment: CRITICAL RESULT CALLED TO, READ BACK BY AND VERIFIED WITH COLE AMORIELLO RN AT 0125 01/24/18. MSS Please note change in reference range.    BUN 66 (H) 6 - 20 mg/dL    Comment: Please note change in reference range.   Creatinine, Ser 1.64 (H) 0.44 - 1.00 mg/dL   Calcium 7.8 (L) 8.9 - 10.3 mg/dL   GFR calc non Af Amer 35 (L) >60 mL/min   GFR calc Af Amer 41 (L) >60 mL/min    Comment: (NOTE) The eGFR has been calculated using the CKD EPI  equation. This calculation has not been validated in all clinical situations. eGFR's persistently <60 mL/min signify possible Chronic Kidney Disease.    Anion gap  19 (H) 5 - 15    Comment: Performed at [facility], [address] [place], [place] [address]  MRSA PCR Screening     Status: None   Collection Time: 01/24/18  1:27 AM  Result Value Ref Range   MRSA by PCR NEGATIVE NEGATIVE    Comment:        The GeneXpert MRSA Assay (FDA approved for NASAL specimens only), is one component of a comprehensive MRSA colonization surveillance program. It is not intended to diagnose MRSA infection nor to guide or monitor treatment for MRSA infections. Performed at [facility], [address] [place], [place] [address]   Glucose, capillary     Status: Abnormal   Collection Time: 01/24/18  2:09 AM  Result Value Ref Range   Glucose-Capillary >600 (HH) 70 - 99 mg/dL  Basic metabolic panel     Status: Abnormal   Collection Time: 01/24/18  3:03 AM  Result Value Ref Range   Sodium 151 (H) 135 - 145 mmol/L   Potassium 3.4 (L) 3.5 - 5.1 mmol/L   Chloride 123 (H) 98 - 111 mmol/L    Comment: Please note change in reference range.   CO2 10 (L) 22 - 32 mmol/L   Glucose, Bld 594 (HH) 70 - 99 mg/dL    Comment: CRITICAL RESULT CALLED TO, READ BACK BY AND VERIFIED WITH JOSH WILLIAMSON AT 2633 ON 01/24/18 [place]. Please note change in reference range.    BUN 66 (H) 6 - 20 mg/dL    Comment: Please note change in reference range.   Creatinine, Ser 1.50 (H) 0.44 - 1.00 mg/dL   Calcium 7.9 (L) 8.9 - 10.3 mg/dL   GFR calc non Af Amer 39 (L) >60 mL/min   GFR calc Af Amer 45 (L) >60 mL/min    Comment: (NOTE) The eGFR has been calculated using the CKD EPI equation. This calculation has not been validated in all clinical situations. eGFR's persistently <60 mL/min signify possible Chronic Kidney Disease.    Anion gap 18 (H) 5 - 15    Comment: Performed at [facility], [address] [place], [place] [address]  CBC     Status: Abnormal   Collection Time: 01/24/18  3:03 AM  Result Value Ref Range   WBC 21.6 (H) 3.6 - 11.0 K/uL   RBC 5.24 (H) 3.80 - 5.20 MIL/uL   Hemoglobin 15.3 12.0 - 16.0 g/dL   HCT 45.6 35.0 - 47.0 %   MCV 86.9 80.0 - 100.0 fL   MCH 29.1 26.0 - 34.0 pg   MCHC 33.5 32.0 - 36.0 g/dL   RDW 13.6 11.5 - 14.5 %   Platelets 172 150 - 440 K/uL    Comment: Performed at [facility], [address] [place], [place] [address]  Lactic acid, plasma     Status: None   Collection Time: 01/24/18  3:03 AM  Result Value Ref Range   Lactic Acid, Venous 1.6 0.5 - 1.9 mmol/L    Comment: Performed at [facility], [address] [place], [place] [address]  Glucose, capillary     Status: Abnormal   Collection Time: 01/24/18  3:46 AM  Result Value Ref Range   Glucose-Capillary 561 (HH) 70 - 99 mg/dL   Comment 1 Notify RN   Glucose, capillary     Status: Abnormal   Collection Time: 01/24/18  4:57 AM  Result Value Ref Range   Glucose-Capillary 461 (H) 70 - 99 mg/dL  Blood gas, arterial     Status: Abnormal (Preliminary result)   Collection Time: 01/24/18  5:00 AM  Result Value Ref Range   FIO2 0.21    Delivery systems ROOM AIR    pH, Arterial 7.29 (L) 7.350 - 7.450   pCO2 arterial 19 (LL) 32.0 - 48.0 mmHg    Comment: CRITICAL RESULT CALLED TO, READ BACK BY AND VERIFIED WITH: Delight Ovens NP AT [phone] 01/24/2018 BY S DAVID RRT     pO2, Arterial 89 83.0 - 108.0 mmHg   Bicarbonate PENDING 20.0 - 28.0 mmol/L   O2 Saturation PENDING %   Patient temperature 37.0    Collection site REVIEWED BY    Sample type ARTERIAL DRAW     Comment: Performed at [facility], [address] [place], [place] [address]  Glucose, capillary     Status: Abnormal   Collection Time: 01/24/18  6:05 AM  Result Value Ref Range   Glucose-Capillary 289 (H) 70 - 99 mg/dL  Basic metabolic panel     Status: Abnormal   Collection Time: 01/24/18  6:25  AM  Result Value Ref Range   Sodium 153 (H) 135 - 145 mmol/L   Potassium 2.7 (LL) 3.5 - 5.1 mmol/L    Comment: CRITICAL RESULT CALLED TO, READ BACK BY AND VERIFIED WITH JOSH WILLIAMSON AT 0786 ON 01/24/18 [place].    Chloride 128 (H) 98 - 111 mmol/L    Comment: Please note change in reference range.   CO2 15 (L) 22 - 32 mmol/L   Glucose, Bld 406 (H) 70 - 99 mg/dL    Comment: Please note change in reference range.   BUN 63 (H) 6 - 20 mg/dL    Comment: Please note change in reference range.   Creatinine, Ser 1.24 (H) 0.44 - 1.00 mg/dL   Calcium 8.5 (L) 8.9 - 10.3 mg/dL   GFR calc non Af Amer 49 (L) >60 mL/min   GFR calc Af Amer 57 (L) >60 mL/min    Comment: (NOTE) The eGFR has been calculated using the CKD EPI equation. This calculation has not been validated in all clinical situations. eGFR's persistently <60 mL/min signify possible Chronic Kidney Disease.    Anion gap 10 5 - 15    Comment: Performed at [facility], [place], [place] [address]  Procalcitonin - Baseline     Status: None   Collection Time: 01/24/18  6:25 AM  Result Value Ref Range   Procalcitonin 1.49 ng/mL    Comment:        Interpretation: PCT > 0.5 ng/mL and <= 2 ng/mL: Systemic infection (sepsis) is possible, but other conditions are known to elevate PCT as well. (NOTE)       Sepsis PCT Algorithm           Lower Respiratory Tract                                      Infection PCT Algorithm    ----------------------------     ----------------------------         PCT < 0.25 ng/mL                PCT < 0.10 ng/mL         Strongly encourage             Strongly discourage   discontinuation of antibiotics    initiation  of antibiotics    ----------------------------     -----------------------------       PCT 0.25 - 0.50 ng/mL            PCT 0.10 - 0.25 ng/mL               OR       >80% decrease in PCT            Discourage initiation of                                             antibiotics      Encourage discontinuation           of antibiotics    ----------------------------     -----------------------------         PCT >= 0.50 ng/mL              PCT 0.26 - 0.50 ng/mL                AND       <80% decrease in PCT             Encourage initiation of                                             antibiotics       Encourage continuation           of antibiotics    ----------------------------     -----------------------------        PCT >= 0.50 ng/mL                  PCT > 0.50 ng/mL               AND         increase in PCT                  Strongly encourage                                      initiation of antibiotics    Strongly encourage escalation           of antibiotics                                     -----------------------------                                           PCT <= 0.25 ng/mL                                                 OR                                        >  80% decrease in PCT                                     Discontinue / Do not initiate                                             antibiotics Performed at [facility], [address] [place], [place] [address]   Glucose, capillary     Status: Abnormal   Collection Time: 01/24/18  7:06 AM  Result Value Ref Range   Glucose-Capillary 261 (H) 70 - 99 mg/dL  Glucose, capillary     Status: Abnormal   Collection Time: 01/24/18  8:42 AM  Result Value Ref Range   Glucose-Capillary 258 (H) 70 - 99 mg/dL  Glucose, capillary     Status: Abnormal   Collection Time: 01/24/18  9:44 AM  Result Value Ref Range   Glucose-Capillary 217 (H) 70 - 99 mg/dL   Comment 1 Notify RN   Basic metabolic panel     Status: Abnormal   Collection Time: 01/24/18 10:26 AM  Result Value Ref Range   Sodium 153 (H) 135 - 145 mmol/L   Potassium 3.3 (L) 3.5 - 5.1 mmol/L   Chloride >130 (HH) 98 - 111 mmol/L    Comment: CRITICAL RESULT CALLED TO, READ BACK BY AND VERIFIED WITH C/FELICIA  PREUDHOMME, RN 01/24/18 1105 JML/DAS Please note change in reference range.    CO2 17 (L) 22 - 32 mmol/L   Glucose, Bld 239 (H) 70 - 99 mg/dL    Comment: Please note change in reference range.   BUN 59 (H) 6 - 20 mg/dL    Comment: Please note change in reference range.   Creatinine, Ser 0.92 0.44 - 1.00 mg/dL   Calcium 8.5 (L) 8.9 - 10.3 mg/dL   GFR calc non Af Amer >60 >60 mL/min   GFR calc Af Amer >60 >60 mL/min    Comment: (NOTE) The eGFR has been calculated using the CKD EPI equation. This calculation has not been validated in all clinical situations. eGFR's persistently <60 mL/min signify possible Chronic Kidney Disease.    Anion gap NOT CALCULATED 5 - 15    Comment: Performed at [facility], [address] [place], [place] [address]  Magnesium     Status: None   Collection Time: 01/24/18 10:26 AM  Result Value Ref Range   Magnesium 2.4 1.7 - 2.4 mg/dL    Comment: Performed at [facility], [address] [place], [place] [address]  Glucose, capillary     Status: Abnormal   Collection Time: 01/24/18 10:47 AM  Result Value Ref Range   Glucose-Capillary 205 (H) 70 - 99 mg/dL  Glucose, capillary     Status: Abnormal   Collection Time: 01/24/18 11:52 AM  Result Value Ref Range   Glucose-Capillary 237 (H) 70 - 99 mg/dL  Basic metabolic panel     Status: Abnormal   Collection Time: 01/24/18  2:01 PM  Result Value Ref Range   Sodium 152 (H) 135 - 145 mmol/L   Potassium 4.0 3.5 - 5.1 mmol/L   Chloride 125 (H) 98 - 111 mmol/L    Comment: Please note change in reference range.   CO2 16 (L) 22 - 32 mmol/L   Glucose,  Bld 364 (H) 70 - 99 mg/dL    Comment: Please note change in reference range.   BUN 58 (H) 6 - 20 mg/dL    Comment: Please note change in reference range.   Creatinine, Ser 0.93 0.44 - 1.00 mg/dL   Calcium 8.8 (L) 8.9 - 10.3 mg/dL   GFR calc non Af Amer >60 >60 mL/min   GFR calc Af Amer >60 >60 mL/min    Comment: (NOTE) The eGFR has been  calculated using the CKD EPI equation. This calculation has not been validated in all clinical situations. eGFR's persistently <60 mL/min signify possible Chronic Kidney Disease.    Anion gap 11 5 - 15    Comment: Performed at [facility], [address] [place], [place] [address]  Glucose, capillary     Status: Abnormal   Collection Time: 01/24/18  4:02 PM  Result Value Ref Range   Glucose-Capillary 326 (H) 70 - 99 mg/dL  Basic metabolic panel     Status: Abnormal   Collection Time: 01/24/18  5:56 PM  Result Value Ref Range   Sodium 150 (H) 135 - 145 mmol/L   Potassium 3.2 (L) 3.5 - 5.1 mmol/L   Chloride 123 (H) 98 - 111 mmol/L    Comment: Please note change in reference range.   CO2 18 (L) 22 - 32 mmol/L   Glucose, Bld 357 (H) 70 - 99 mg/dL    Comment: Please note change in reference range.   BUN 50 (H) 6 - 20 mg/dL    Comment: Please note change in reference range.   Creatinine, Ser 0.80 0.44 - 1.00 mg/dL   Calcium 9.0 8.9 - 10.3 mg/dL   GFR calc non Af Amer >60 >60 mL/min   GFR calc Af Amer >60 >60 mL/min    Comment: (NOTE) The eGFR has been calculated using the CKD EPI equation. This calculation has not been validated in all clinical situations. eGFR's persistently <60 mL/min signify possible Chronic Kidney Disease.    Anion gap 9 5 - 15    Comment: Performed at [facility], [address] [place], [place] [address]    Current Facility-Administered Medications  Medication Dose Route Frequency Provider Last Rate Last Dose  . 0.45 % sodium chloride infusion   Intravenous Continuous Lahoma Rocker, MD      . famotidine (PEPCID) tablet 20 mg  20 mg Oral BID Lahoma Rocker, MD   20 mg at 01/24/18 1056  . heparin injection 5,000 Units  5,000 Units Subcutaneous Q8H Lance Coon, MD   5,000 Units at 01/24/18 1410  . hydrochlorothiazide (MICROZIDE) capsule 12.5 mg  12.5 mg Oral Daily Lahoma Rocker, MD   12.5 mg at 01/24/18 1056  . insulin aspart (novoLOG)  injection 0-15 Units  0-15 Units Subcutaneous TID WC Lahoma Rocker, MD   11 Units at 01/24/18 1612  . insulin aspart (novoLOG) injection 0-5 Units  0-5 Units Subcutaneous QHS Manuella Ghazi, [place], MD      . insulin aspart protamine- aspart (NOVOLOG MIX 70/30) injection 25 Units  25 Units Subcutaneous BID WC Demetrios Loll, MD   25 Units at 01/24/18 1612  . lisinopril (PRINIVIL,ZESTRIL) tablet 10 mg  10 mg Oral Daily Lahoma Rocker, MD   10 mg at 01/24/18 1056  . nicotine (NICODERM CQ - dosed in mg/24 hours) patch 14 mg  14 mg Transdermal Daily Lahoma Rocker, MD      . protein supplement (PREMIER PROTEIN) liquid - approved for s/p bariatric surgery  11 oz Oral BID BM Lahoma Rocker, MD   11 oz at 01/24/18 1411    Musculoskeletal: Strength & Muscle Tone: decreased Gait & Station: unable to stand Patient leans: N/A  Psychiatric Specialty Exam: Physical Exam  Nursing note and vitals reviewed. Constitutional: She appears well-developed and well-nourished.  HENT:  Head: Normocephalic and atraumatic.  Eyes: Pupils are equal, round, and reactive to light. Conjunctivae are normal.  Neck: Normal range of motion.  Cardiovascular: Regular rhythm and normal heart sounds.  Respiratory: Effort normal. No respiratory distress.  GI: Soft.  Musculoskeletal: Normal range of motion.  Neurological: She is alert.  Skin: Skin is warm and dry.  Psychiatric: Her affect is blunt. She is noncommunicative.    Review of Systems  Unable to perform ROS: Patient nonverbal    Blood pressure (!) 154/79, pulse 89, temperature 100 F (37.8 C), temperature source Oral, resp. rate (!) 35, height 5' 1"  (1.549 m), weight 59 kg (130 lb), SpO2 97 %.Body mass index is 24.56 kg/m.  General Appearance: Disheveled  Eye Contact:  None  Speech:  Negative  Volume:  Decreased  Mood:  Negative  Affect:  Negative  Thought Process:  NA  Orientation:  Negative  Thought Content:  Negative  Suicidal Thoughts:  No  Homicidal Thoughts:  No  Memory:   Negative  Judgement:  Negative  Insight:  Negative  Psychomotor Activity:  Negative  Concentration:  Concentration: Negative  Recall:  Negative  Fund of Knowledge:  Negative  Language:  Negative  Akathisia:  Negative  Handed:  Right  AIMS (if indicated):     Assets:  Resilience  ADL's:  Impaired  Cognition:  Impaired,  Severe  Sleep:        Treatment Plan Summary: Plan Patient continues to be only partially conscious.  Intermittently so.  Nursing tells me last night she was awake enough to communicate briefly.  Tonight has not been verbal.  Patient currently providing no information.  At this point I have nothing to go on about appropriate treatment.  If she were to become agitated the usual interventions in the intensive care unit would be appropriate.  Would not start methadone or any other narcotic medicine either for detox or for maintenance at this point without further information.  I will follow-up as needed.  Disposition: No evidence of imminent risk to self or others at present.   Patient does not meet criteria for psychiatric inpatient admission. Supportive therapy provided about ongoing stressors.  Alethia Berthold, MD 01/24/2018 7:31 PM

## 2018-01-24 NOTE — H&P (Signed)
[facility] - [place] at [facility]   PATIENT NAME: Karen Lindsey    MR#:  [id number]  DATE OF BIRTH:  01/24/1966  DATE OF ADMISSION:  01/23/2018  PRIMARY CARE PHYSICIAN: [facility]   REQUESTING/REFERRING PHYSICIAN: Derrill Kay, MD  CHIEF COMPLAINT:   Chief Complaint  Patient presents with  . Altered Mental Status    HISTORY OF PRESENT ILLNESS:  Karen Lindsey  is a 52 y.o. female who presents with altered mental status.  Patient was given some Narcan by EMS with some increase in her mental state.  She has a history of narcotic abuse, that is unclear at this time if that is contributing to her current situation.  Here in the ED she was found to be in DKA.  DKA protocol was initiated and hospitalist were called for admission.  Patient is unable to contribute any significant information to her HPI, though she is able to open her eyes and respond minimally to conversation.  PAST MEDICAL HISTORY:   Past Medical History:  Diagnosis Date  . Arthritis    rheumatoid  . Chronic pain    a. upper/mid back.  . Diabetic peripheral neuropathy (HCC)   . Fatty liver   . Hypertension   . Insulin dependent diabetes mellitus (HCC)    a. Dx ~ 2011.  . Narcotic addiction (HCC)    a. Followed in substance abuse center in [place] - on chronic methadone.  . Neuropathy   . RA (rheumatoid arthritis) (HCC)   . Tobacco abuse      PAST SURGICAL HISTORY:   Past Surgical History:  Procedure Laterality Date  . ABDOMINAL HYSTERECTOMY    . CESAREAN SECTION    . CHOLECYSTECTOMY    . KNEE SURGERY    . MANDIBLE FRACTURE SURGERY       SOCIAL HISTORY:   Social History   Tobacco Use  . Smoking status: Current Every Day Smoker    Packs/day: 0.50    Types: Cigarettes  . Smokeless tobacco: Never Used  . Tobacco comment: 30+ years  Substance Use Topics  . Alcohol use: No    Comment: Hasn't had a drink since 1998     FAMILY HISTORY:  Unable to review  family history due to patient condition   DRUG ALLERGIES:   Allergies  Allergen Reactions  . Ultram [Tramadol Hcl] Palpitations    MEDICATIONS AT HOME:   Prior to Admission medications   Medication Sig Start Date End Date Taking? Authorizing Provider  baclofen (LIORESAL) 10 MG tablet Take 1 tablet (10 mg total) by mouth 3 (three) times daily. Patient not taking: Reported on 05/09/2017 10/20/15   Kem Boroughs B, FNP  benzonatate (TESSALON PERLES) 100 MG capsule Take 1 capsule (100 mg total) by mouth every 6 (six) hours as needed for cough. 08/06/17 08/06/18  Jeanmarie Plant, MD  Calcium Carb-Ergocalciferol 500-200 MG-UNIT TABS Take 1 tablet by mouth daily.    [provider]  canagliflozin (INVOKANA) 100 MG TABS tablet Take 100 mg by mouth daily. 02/24/17   [provider]  cephALEXin (KEFLEX) 500 MG capsule Take 1 capsule (500 mg total) by mouth every 12 (twelve) hours. 11/09/17   Salary, Evelena Asa, MD  cyclobenzaprine (FLEXERIL) 5 MG tablet Take 1 tablet (5 mg total) by mouth 3 (three) times daily as needed for muscle spasms. Patient not taking: Reported on 05/09/2017 11/02/16   Menshew, Charlesetta Ivory, PA-C  diclofenac (VOLTAREN) 50 MG EC tablet Take 1  tablet (50 mg total) by mouth 2 (two) times daily. Patient not taking: Reported on 05/09/2017 11/02/16   Menshew, Charlesetta Ivory, PA-C  DOCOSAHEXAENOIC ACID PO Take 1 capsule by mouth daily.    [provider]  famotidine (PEPCID) 20 MG tablet Take 1 tablet (20 mg total) by mouth 2 (two) times daily. 09/15/17 09/15/18  Merrily Brittle, MD  fluconazole (DIFLUCAN) 150 MG tablet Take 1 tablet (150 mg total) by mouth daily. 11/10/17   Salary, Evelena Asa, MD  gabapentin (NEURONTIN) 300 MG capsule Take 1,200 mg by mouth 3 (three) times daily.     [provider]  glucosamine-chondroitin 500-400 MG tablet Take 1 tablet by mouth daily.    [provider]  ibuprofen (ADVIL,MOTRIN) 200 MG tablet Take 200 mg by  mouth every 6 (six) hours as needed.    [provider]  ibuprofen (ADVIL,MOTRIN) 600 MG tablet Take 1 tablet (600 mg total) every 8 (eight) hours as needed by mouth. 05/24/17   Merrily Brittle, MD  insulin NPH Human (HUMULIN N) 100 UNIT/ML injection Inject 0.4 mLs (40 Units total) into the skin at bedtime. Patient not taking: Reported on 05/09/2017 05/02/16   [name], MD  insulin NPH-regular Human (NOVOLIN 70/30) (70-30) 100 UNIT/ML injection 20-30 units [place] before breakfast and dinner  Check glucose prior to usage Patient taking differently: Inject 32 Units into the skin 2 (two) times daily with a meal.  09/13/16   Schaevitz, Myra Rude, MD  ketorolac (TORADOL) 10 MG tablet Take 1 tablet (10 mg total) by mouth every 8 (eight) hours as needed. Patient not taking: Reported on 05/09/2017 05/12/15   [name], MD  lisinopril-hydrochlorothiazide (PRINZIDE,ZESTORETIC) 10-12.5 MG tablet Take 1 tablet by mouth daily.    [provider]  LORazepam (ATIVAN) 0.5 MG tablet Take 1 tablet (0.5 mg total) by mouth every 8 (eight) hours as needed for anxiety. Patient not taking: Reported on 05/09/2017 03/12/15   Darien Ramus, MD  meloxicam (MOBIC) 15 MG tablet Take 1 tablet (15 mg total) by mouth daily. Patient not taking: Reported on 05/09/2017 10/20/15   Kem Boroughs B, FNP  metFORMIN (GLUCOPHAGE) 500 MG tablet Take 500 mg by mouth every evening.     [provider]  Multiple Vitamin (MULTIVITAMIN WITH MINERALS) TABS tablet Take 1 tablet by mouth daily.    [provider]  ondansetron (ZOFRAN) 4 MG tablet Take 1 tablet (4 mg total) by mouth every 8 (eight) hours as needed for nausea or vomiting. Patient not taking: Reported on 05/09/2017 10/27/15   Phineas Semen, MD  ondansetron [facility]) 4 MG tablet Take 1 tablet (4 mg total) by mouth daily as needed for nausea or vomiting. 06/24/17 06/24/18  Willy Eddy, MD  oxyCODONE-acetaminophen (PERCOCET)  7.5-325 MG tablet Take 1 tablet by mouth every 6 (six) hours as needed for severe pain. Patient not taking: Reported on 05/09/2017 12/10/16   Joni Reining, PA-C  promethazine (PHENERGAN) 12.5 MG tablet Take 1 tablet (12.5 mg total) by mouth every 6 (six) hours as needed for nausea or vomiting. Patient not taking: Reported on 05/09/2017 02/15/16   Willy Eddy, MD  vitamin E 400 UNIT capsule Take 400 Units by mouth daily.    [provider]    REVIEW OF SYSTEMS:  Review of Systems  Unable to perform ROS: Critical illness     VITAL SIGNS:   Vitals:   01/23/18 2214 01/23/18 2216 01/23/18 2230 01/24/18 0000  BP: 110/75  125/71 127/70  Pulse: (!) 140   (!) 124  Resp: 20  (!) 28 (!) 26  Temp:  98.4 F (36.9 C)    TempSrc:  Oral    SpO2: 100%   100%  Weight:  59 kg (130 lb)    Height:  5\' 1"  (1.549 m)     Wt Readings from Last 3 Encounters:  01/23/18 59 kg (130 lb)  01/20/18 59 kg (130 lb)  11/09/17 62 kg (136 lb 11.2 oz)    PHYSICAL EXAMINATION:  Physical Exam  Vitals reviewed. Constitutional: She appears well-developed and well-nourished. No distress.  HENT:  Head: Normocephalic and atraumatic.  Mouth/Throat: Oropharynx is clear and moist.  Eyes: Pupils are equal, round, and reactive to light. Conjunctivae and EOM are normal. No scleral icterus.  Neck: Normal range of motion. Neck supple. No JVD present. No thyromegaly present.  Cardiovascular: Regular rhythm and intact distal pulses. Exam reveals no gallop and no friction rub.  No murmur heard. Tachycardic  Respiratory: Effort normal and breath sounds normal. No respiratory distress. She has no wheezes. She has no rales.  GI: Soft. Bowel sounds are normal. She exhibits no distension. There is no tenderness.  Musculoskeletal: Normal range of motion. She exhibits no edema.  No arthritis, no gout  Lymphadenopathy:    She has no cervical adenopathy.  Neurological: No cranial nerve deficit.  Very lethargic, she  will open eyes to command and follows some very basic commands like deep breathing, but does not converse in a significant way  Skin: Skin is warm and dry. No rash noted. No erythema.  Psychiatric:  Unable to assess due to patient condition    LABORATORY PANEL:   CBC Recent Labs  Lab 01/23/18 2217  WBC 24.7*  HGB 16.5*  HCT 53.2*  PLT 303   ------------------------------------------------------------------------------------------------------------------  Chemistries  Recent Labs  Lab 01/23/18 2217  NA 138  K 2.6*  CL 97*  CO2 9*  GLUCOSE 1,064*  BUN 72*  CREATININE 2.18*  CALCIUM 9.2  AST 17  ALT 20  ALKPHOS 82  BILITOT 2.2*   ------------------------------------------------------------------------------------------------------------------  Cardiac Enzymes Recent Labs  Lab 01/20/18 1751  TROPONINI <0.03   ------------------------------------------------------------------------------------------------------------------  RADIOLOGY:  No results found.  EKG:   Orders placed or performed during the hospital encounter of 01/23/18  . EKG 12-Lead  . EKG 12-Lead    IMPRESSION AND PLAN:  Principal Problem:   DKA (diabetic ketoacidoses) (HCC) -IV insulin, admit to ICU, follow DKA protocols, continue working up for possibility of infection as patient has an elevated white blood cell count. Active Problems:   AKI (acute kidney injury) (HCC) -related to DKA as above, aggressive fluids as per DKA protocol, avoid nephrotoxins and monitor   HTN (hypertension) -currently normotensive, hold home meds for now   Narcotic abuse (HCC) -unclear if this is playing a role in her current condition, treat as above and monitor closely.  Chart review performed and case discussed with ED provider. Labs, imaging and/or ECG reviewed by provider and discussed with patient/family. Management plans discussed with the patient and/or family.  DVT PROPHYLAXIS: SubQ heparin  GI  PROPHYLAXIS: None  ADMISSION STATUS: Inpatient  CODE STATUS: Full Code Status History    Date Active Date Inactive Code Status Order ID Comments User Context   11/08/2017 2204 11/09/2017 1711 Full Code [id number]  Arnaldo Natal, MD Inpatient      TOTAL CRITICAL CARE TIME TAKING CARE OF THIS PATIENT: 50 minutes.   Litisha Guagliardo  FIELDING 01/24/2018, 12:56 AM  [facility]  Office  [phone]  CC: Primary care physician; [facility]  Note:  This document was prepared using Conservation officer, historic buildings and may include unintentional dictation errors.

## 2018-01-24 NOTE — Progress Notes (Signed)
Patient more alert and responsive but will not speak to staff. Does shake her head yes and no. Refused lunch and dinner and her protein shake. Only took sips and bites of lunch and no dinner. Asked if patient have pain and did not answer. Pharmacist and nurse tried to ask patient where she got her methadone and patient will not answer. Patient did state her name once, but is not speaking to staff.

## 2018-01-24 NOTE — ED Provider Notes (Signed)
[facility] Emergency Department Provider Note    ____________________________________________   I have reviewed the triage vital signs and the nursing notes.   HISTORY  Chief Complaint Altered Mental Status   History limited by and level 5 caveat due to: AMS   HPI Karen Lindsey is a 52 y.o. female who presents to the emergency department today via EMS as emergency traffic because of unresponsiveness. Apparently there was a companion at the location where they picked her up. She has had intermittent decreased levels of responsiveness throughout the day. When EMS arrived the patient was only responsive to painful stimuli. She was given narcan and did respond slightly to it. The patient herself is unable to give any history.   Per medical record review patient has a history of DM, narcotic use.  Past Medical History:  Diagnosis Date  . Arthritis    rheumatoid  . Chronic pain    a. upper/mid back.  . Diabetic peripheral neuropathy (HCC)   . Fatty liver   . Hypertension   . Insulin dependent diabetes mellitus (HCC)    a. Dx ~ 2011.  . Narcotic addiction (HCC)    a. Followed in substance abuse center in [place] - on chronic methadone.  . Neuropathy   . RA (rheumatoid arthritis) (HCC)   . Tobacco abuse     Patient Active Problem List   Diagnosis Date Noted  . Narcotic abuse (HCC) 01/24/2018  . DKA (diabetic ketoacidoses) (HCC) 01/23/2018  . AKI (acute kidney injury) (HCC) 01/23/2018  . HTN (hypertension) 01/23/2018  . Chest pain 11/08/2017    Past Surgical History:  Procedure Laterality Date  . ABDOMINAL HYSTERECTOMY    . CESAREAN SECTION    . CHOLECYSTECTOMY    . KNEE SURGERY    . MANDIBLE FRACTURE SURGERY      Prior to Admission medications   Medication Sig Start Date End Date Taking? Authorizing Provider  DULoxetine (CYMBALTA) 30 MG capsule Take 30 mg by mouth daily.   Yes [provider]  hydrOXYzine (VISTARIL) 25 MG capsule  Take 25-50 mg by mouth 3 (three) times daily as needed for anxiety.   Yes [provider]  baclofen (LIORESAL) 10 MG tablet Take 1 tablet (10 mg total) by mouth 3 (three) times daily. Patient not taking: Reported on 05/09/2017 10/20/15   Kem Boroughs B, FNP  cephALEXin (KEFLEX) 500 MG capsule Take 1 capsule (500 mg total) by mouth every 12 (twelve) hours. Patient not taking: Reported on 01/24/2018 11/09/17   Salary, Jetty Duhamel D, MD  famotidine (PEPCID) 20 MG tablet Take 1 tablet (20 mg total) by mouth 2 (two) times daily. Patient not taking: Reported on 01/24/2018 09/15/17 09/15/18  Merrily Brittle, MD  fluconazole (DIFLUCAN) 150 MG tablet Take 1 tablet (150 mg total) by mouth daily. Patient not taking: Reported on 01/24/2018 11/10/17   Salary, Jetty Duhamel D, MD  insulin NPH Human (HUMULIN N) 100 UNIT/ML injection Inject 0.4 mLs (40 Units total) into the skin at bedtime. Patient not taking: Reported on 01/24/2018 05/02/16   [name], MD  insulin NPH-regular Human (NOVOLIN 70/30) (70-30) 100 UNIT/ML injection 20-30 units [place] before breakfast and dinner  Check glucose prior to usage Patient not taking: Reported on 01/24/2018 09/13/16   Myrna Blazer, MD    Allergies Ultram Marcia Brash hcl]  No family history on file.  Social History Social History   Tobacco Use  . Smoking status: Current Every Day Smoker    Packs/day: 0.50  Types: Cigarettes  . Smokeless tobacco: Never Used  . Tobacco comment: 30+ years  Substance Use Topics  . Alcohol use: No    Comment: Hasn't had a drink since 1998  . Drug use: Yes    Comment: Previously abused pain pills. prescribed methadone     Review of Systems Unable to obtain secondary to altered mental status.  ____________________________________________   PHYSICAL EXAM:  VITAL SIGNS: ED Triage Vitals  Enc Vitals Group     BP 01/23/18 2214 110/75     Pulse Rate 01/23/18 2214 (!) 140     Resp 01/23/18 2214 20     Temp 01/23/18 2216  98.4 F (36.9 C)     Temp Source 01/23/18 2216 Oral     SpO2 01/23/18 2214 100 %     Weight 01/23/18 2216 130 lb (59 kg)     Height 01/23/18 2216 5\' 1"  (1.549 m)   Constitutional: Somnolent, awakens to verbal stimuli.  Eyes: Conjunctivae are normal.  ENT      Head: Normocephalic and atraumatic.      Nose: No congestion/rhinnorhea.      Mouth/Throat: Mucous membranes are moist.      Neck: No stridor. Hematological/Lymphatic/Immunilogical: No cervical lymphadenopathy. Cardiovascular: Tachycardic, regular rhythm.  No murmurs, rubs, or gallops.  Respiratory: Normal respiratory effort without tachypnea nor retractions. Breath sounds are clear and equal bilaterally. No wheezes/rales/rhonchi. Gastrointestinal: Soft and non tender. No rebound. No guarding.  Genitourinary: Deferred Musculoskeletal: Normal range of motion in all extremities. No lower extremity edema. Neurologic:  Somnolent. Awakens to verbal stimuli, appears to move all extremities.  Skin:  Skin is warm, dry and intact. No rash noted.  ____________________________________________    LABS (pertinent positives/negatives)  CBC wbc 24.7, hgb 16.5, plt 303 CMP na 138, k 2.6, glu 1064, cr 2.18, anion gap 32 VBG pH 7.15 UA clear, ketones 20 ____________________________________________   EKG  I, [facility] Semen, attending physician, personally viewed and interpreted this EKG  EKG Time: 2215 Rate: 141 Rhythm: sinus tachycardia Axis: normal Intervals: qtc 494 QRS: narrow ST changes: no st elevation Impression: abnormal ekg   ____________________________________________    RADIOLOGY  None  ____________________________________________   PROCEDURES  Procedures  CRITICAL CARE Performed by: Phineas Semen   Total critical care time: 35 minutes  Critical care time was exclusive of separately billable procedures and treating other patients.  Critical care was necessary to treat or prevent imminent or  life-threatening deterioration.  Critical care was time spent personally by me on the following activities: development of treatment plan with patient and/or surrogate as well as nursing, discussions with consultants, evaluation of patient's response to treatment, examination of patient, obtaining history from patient or surrogate, ordering and performing treatments and interventions, ordering and review of laboratory studies, ordering and review of radiographic studies, pulse oximetry and re-evaluation of patient's condition.  ____________________________________________   INITIAL IMPRESSION / ASSESSMENT AND PLAN / ED COURSE  Pertinent labs & imaging results that were available during my care of the patient were reviewed by me and considered in my medical decision making (see chart for details).   Patient presented to the emergency department as emergency traffic secondary to unresponsiveness.  EMS did give some Narcan which did help and at the time of presentation she was somewhat somnolent however easily awoken with verbal stimuli.  Differential would be broad for causes of AMS.  Patient is diabetic was noted to have extremely elevated sugar level.  Blood work was consistent with DKA with  acidosis and ketones in the urine.  However potassium was found to be quite low.  Because of this patient was given potassium repletion prior to starting insulin drip.  Patient was also found to have a leukocytosis which I think is likely secondary to DKA.  Will plan on admission to the hospital service.  ____________________________________________   FINAL CLINICAL IMPRESSION(S) / ED DIAGNOSES  Final diagnoses:  Altered mental status, unspecified altered mental status type  Diabetic ketoacidosis without coma associated with diabetes mellitus due to underlying condition (HCC)  Hypokalemia     Note: This dictation was prepared with Dragon dictation. Any transcriptional errors that result from this process  are unintentional     Phineas Semen, MD 01/24/18 1704

## 2018-01-24 NOTE — Progress Notes (Signed)
[employer] - [place] at [facility]   PATIENT NAME: Karen Lindsey    MR#:  [id number]  DATE OF BIRTH:  1965-09-22  SUBJECTIVE:  CHIEF COMPLAINT:   Chief Complaint  Patient presents with  . Altered Mental Status   Patient is still drowsy and lethargic, tachycardia.  Off insulin drip. REVIEW OF SYSTEMS:  Review of Systems  Unable to perform ROS: Mental status change    DRUG ALLERGIES:   Allergies  Allergen Reactions  . Ultram [Tramadol Hcl] Palpitations   VITALS:  Blood pressure (!) 156/77, pulse (!) 105, temperature 99.9 F (37.7 C), temperature source Oral, resp. rate (!) 35, height 5\' 1"  (1.549 m), weight 130 lb (59 kg), SpO2 98 %. PHYSICAL EXAMINATION:  Physical Exam  Constitutional: She appears well-developed. No distress.  HENT:  Head: Normocephalic.  Eyes: Pupils are equal, round, and reactive to light. Conjunctivae are normal. No scleral icterus.  Neck: Neck supple. No JVD present. No tracheal deviation present.  Cardiovascular: Regular rhythm and normal heart sounds. Exam reveals no gallop.  No murmur heard. Tachycardia.  Pulmonary/Chest: Effort normal and breath sounds normal. No respiratory distress. She has no wheezes. She has no rales.  Abdominal: Soft. Bowel sounds are normal. She exhibits no distension. There is no tenderness. There is no rebound.  Musculoskeletal: She exhibits no edema or tenderness.  Neurological:  Lethargic, unable to exam.  Skin: No rash noted. No erythema.   LABORATORY PANEL:  Female CBC Recent Labs  Lab 01/24/18 0303  WBC 21.6*  HGB 15.3  HCT 45.6  PLT 172   ------------------------------------------------------------------------------------------------------------------ Chemistries  Recent Labs  Lab 01/23/18 2217  01/24/18 1026 01/24/18 1401  NA 138   < > 153* 152*  K 2.6*   < > 3.3* 4.0  CL 97*   < > >130* 125*  CO2 9*   < > 17* 16*  GLUCOSE 1,064*   < > 239* 364*  BUN 72*   < > 59* 58*    CREATININE 2.18*   < > 0.92 0.93  CALCIUM 9.2   < > 8.5* 8.8*  MG  --   --  2.4  --   AST 17  --   --   --   ALT 20  --   --   --   ALKPHOS 82  --   --   --   BILITOT 2.2*  --   --   --    < > = values in this interval not displayed.   RADIOLOGY:  Ct Head Wo Contrast  Result Date: 01/24/2018 CLINICAL DATA:  52 y/o F; episode of unresponsiveness and hyperglycemia. EXAM: CT HEAD WITHOUT CONTRAST TECHNIQUE: Contiguous axial images were obtained from the base of the skull through the vertex without intravenous contrast. COMPARISON:  11/08/2017 CT head. FINDINGS: Brain: No evidence of acute infarction, hemorrhage, hydrocephalus, extra-axial collection or mass lesion/mass effect. Stable mild chronic microvascular ischemic changes and volume loss of the brain. Vascular: Calcific atherosclerosis of carotid siphons. No hyperdense vessel identified. Skull: Normal. Negative for fracture or focal lesion. Sinuses/Orbits: No acute finding. Other: None. IMPRESSION: 1. No acute intracranial abnormality identified. 2. Stable mild chronic microvascular ischemic changes and volume loss of the brain. Electronically Signed   By: 01/08/2018 M.D.   On: 01/24/2018 05:42   Dg Chest Port 1 View  Result Date: 01/24/2018 CLINICAL DATA:  52 y/o F; arrived unresponsive, nonresponsive, history of drug abuse. EXAM: PORTABLE CHEST 1 VIEW COMPARISON:  01/20/2018 chest radiograph. FINDINGS: Normal cardiac silhouette. Aortic atherosclerosis with calcification. Clear lungs. No pleural effusion or pneumothorax. No acute osseous abnormality is evident. Right upper quadrant cholecystectomy surgical clips. IMPRESSION: No active disease. Electronically Signed   By: Mitzi Hansen M.D.   On: 01/24/2018 02:29   ASSESSMENT AND PLAN:   DKA (diabetic ketoacidoses)  The patient has been on IV insulin and IV fluid support.  DKA improved.  Diabetes 2.  Started NovoLog 70/30 25 units twice daily and sliding scale.     AKI (acute kidney injury) (HCC) -related to DKA as above, still dehydration, continue IV fluid support.  Hyponatremia.  On D5 IV, follow-up sodium level.  Leukocytosis.  Possible due to reaction to DKA, no source of infection so far.  Follow-up CBC.  Acute metabolic encephalopathy due to above.   HTN (hypertension), resume hypertension medication.    Narcotic abuse.   All the records are reviewed and case discussed with Care Management/Social Worker. Management plans discussed with the patient, family and they are in agreement.  CODE STATUS: Full Code  TOTAL TIME TAKING CARE OF THIS PATIENT: 37 minutes.   More than 50% of the time was spent in counseling/coordination of care: YES  POSSIBLE D/C IN 2-3 DAYS, DEPENDING ON CLINICAL CONDITION.   Shaune Pollack M.D on 01/24/2018 at 2:48 PM  Between 7am to 6pm - Pager - [phone]  After 6pm go to [email or web address] - Therapist, nutritional Hospitalists

## 2018-01-24 NOTE — Progress Notes (Signed)
Insulin gtt stopped and started on 70/30, spoke with diiabetes coordinator about changes. MD wanted insulin stopped and ordered 70/30 coverage. Had to speak with pharmacist to change start time to 1130 instead of 1700 as ordered. Patient received 70/30 and sq coverage as ordered. MD explained that patient had not been in DKA, the elevated chloride was due to NS administered during the night. The anion gap has been closed and that is why MD stopped insulin gtt and did not transition as per DKA protocol. Called dietitian to order protein supplement since patient does not want to eat. Only took a dew bites and refused. Only took a couple sips of the protein supplement. Continue to monitor.

## 2018-01-24 NOTE — Progress Notes (Signed)
Patient Karen Lindsey improved but BS is going up Will change fluid to 1/2 NS Follow BMP and adjust fluids Patient is refusing things Psych consult is called pending eval  Keino Placencia [facility] Pulmonary Critical Care & Sleep Medicine

## 2018-01-24 NOTE — Progress Notes (Signed)
eLink Physician-Brief Progress Note Patient Name: Karen Lindsey DOB: [birth year] MRN: [id number]   Date of Service  01/24/2018  HPI/Events of Note  52 yo female admitted to ICU with DKA.   eICU Interventions  Stable on camera check. No acute issues. ICU RN to eval.         Shane Crutch 01/24/2018, 2:08 AM

## 2018-01-25 LAB — CBC WITH DIFFERENTIAL/PLATELET
Basophils Absolute: 0.1 10*3/uL (ref 0–0.1)
Basophils Relative: 1 %
EOS PCT: 0 %
Eosinophils Absolute: 0 10*3/uL (ref 0–0.7)
HCT: 41.6 % (ref 35.0–47.0)
Hemoglobin: 14.1 g/dL (ref 12.0–16.0)
LYMPHS ABS: 2.8 10*3/uL (ref 1.0–3.6)
LYMPHS PCT: 16 %
MCH: 28.9 pg (ref 26.0–34.0)
MCHC: 33.9 g/dL (ref 32.0–36.0)
MCV: 85.2 fL (ref 80.0–100.0)
MONO ABS: 1.1 10*3/uL — AB (ref 0.2–0.9)
MONOS PCT: 7 %
Neutro Abs: 13.1 10*3/uL — ABNORMAL HIGH (ref 1.4–6.5)
Neutrophils Relative %: 76 %
PLATELETS: 143 10*3/uL — AB (ref 150–440)
RBC: 4.88 MIL/uL (ref 3.80–5.20)
RDW: 13.1 % (ref 11.5–14.5)
WBC: 17.2 10*3/uL — ABNORMAL HIGH (ref 3.6–11.0)

## 2018-01-25 LAB — GLUCOSE, CAPILLARY
GLUCOSE-CAPILLARY: 288 mg/dL — AB (ref 70–99)
GLUCOSE-CAPILLARY: 258 mg/dL — AB (ref 70–99)
GLUCOSE-CAPILLARY: 303 mg/dL — AB (ref 70–99)
GLUCOSE-CAPILLARY: 197 mg/dL — AB (ref 70–99)
Glucose-Capillary: 214 mg/dL — ABNORMAL HIGH (ref 70–99)
Glucose-Capillary: 216 mg/dL — ABNORMAL HIGH (ref 70–99)
Glucose-Capillary: 282 mg/dL — ABNORMAL HIGH (ref 70–99)

## 2018-01-25 LAB — BASIC METABOLIC PANEL
ANION GAP: 9 (ref 5–15)
Anion gap: 9 (ref 5–15)
BUN: 43 mg/dL — ABNORMAL HIGH (ref 6–20)
BUN: 41 mg/dL — AB (ref 6–20)
CALCIUM: 8.7 mg/dL — AB (ref 8.9–10.3)
CHLORIDE: 118 mmol/L — AB (ref 98–111)
CHLORIDE: 121 mmol/L — AB (ref 98–111)
CO2: 20 mmol/L — AB (ref 22–32)
CO2: 21 mmol/L — AB (ref 22–32)
CREATININE: 0.66 mg/dL (ref 0.44–1.00)
CREATININE: 0.68 mg/dL (ref 0.44–1.00)
Calcium: 9 mg/dL (ref 8.9–10.3)
GFR calc Af Amer: >60 mL/min (ref >60)
GFR calc Af Amer: >60 mL/min (ref >60)
GFR calc non Af Amer: >60 mL/min (ref >60)
GFR calc non Af Amer: >60 mL/min (ref >60)
GLUCOSE: 247 mg/dL — AB (ref 70–99)
GLUCOSE: 296 mg/dL — AB (ref 70–99)
Potassium: 2.7 mmol/L — CL (ref 3.5–5.1)
Potassium: 2.7 mmol/L — CL (ref 3.5–5.1)
Sodium: 150 mmol/L — ABNORMAL HIGH (ref 135–145)
Sodium: 148 mmol/L — ABNORMAL HIGH (ref 135–145)

## 2018-01-25 LAB — C DIFFICILE QUICK SCREEN W PCR REFLEX
C DIFFICILE (CDIFF) INTERP: NOT DETECTED
C DIFFICILE (CDIFF) TOXIN: NEGATIVE
C Diff antigen: NEGATIVE

## 2018-01-25 LAB — MAGNESIUM: Magnesium: 2.2 mg/dL (ref 1.7–2.4)

## 2018-01-25 LAB — PHOSPHORUS
Phosphorus: 1.9 mg/dL — ABNORMAL LOW (ref 2.5–4.6)
Phosphorus: 1.9 mg/dL — ABNORMAL LOW (ref 2.5–4.6)

## 2018-01-25 LAB — BRAIN NATRIURETIC PEPTIDE: B Natriuretic Peptide: 62 pg/mL (ref 0.0–100.0)

## 2018-01-25 LAB — POTASSIUM: POTASSIUM: 3.3 mmol/L — AB (ref 3.5–5.1)

## 2018-01-25 MED ORDER — POTASSIUM CHLORIDE CRYS ER 20 MEQ PO TBCR
40.0000 meq | EXTENDED_RELEASE_TABLET | Freq: Once | ORAL | Status: DC
  Filled 2018-01-25: qty 2

## 2018-01-25 MED ORDER — BUPRENORPHINE HCL-NALOXONE HCL 8-2 MG SL SUBL
1.0000 | SUBLINGUAL_TABLET | Freq: Two times a day (BID) | SUBLINGUAL | Status: DC
  Administered 2018-01-25 – 2018-01-26 (×2): 1 via SUBLINGUAL
  Filled 2018-01-25 (×2): qty 1

## 2018-01-25 MED ORDER — INSULIN GLARGINE 100 UNIT/ML ~~LOC~~ SOLN
50.0000 [IU] | Freq: Every day | SUBCUTANEOUS | Status: DC
  Administered 2018-01-25: 50 [IU] via SUBCUTANEOUS
  Filled 2018-01-25 (×2): qty 0.5

## 2018-01-25 MED ORDER — HYDROCHLOROTHIAZIDE 25 MG PO TABS
25.0000 mg | ORAL_TABLET | Freq: Every day | ORAL | Status: DC
  Administered 2018-01-25: 25 mg via ORAL
  Filled 2018-01-25: qty 1

## 2018-01-25 MED ORDER — POTASSIUM CHLORIDE CRYS ER 20 MEQ PO TBCR
40.0000 meq | EXTENDED_RELEASE_TABLET | Freq: Once | ORAL | Status: AC
  Administered 2018-01-25: 40 meq via ORAL
  Filled 2018-01-25: qty 2

## 2018-01-25 MED ORDER — LOPERAMIDE HCL 2 MG PO CAPS
2.0000 mg | ORAL_CAPSULE | Freq: Four times a day (QID) | ORAL | Status: DC | PRN
  Administered 2018-01-25: 2 mg via ORAL
  Filled 2018-01-25: qty 1

## 2018-01-25 MED ORDER — ZOLPIDEM TARTRATE 5 MG PO TABS: 5.0000 mg | ORAL_TABLET | Freq: Every day | ORAL | Status: DC

## 2018-01-25 MED ORDER — ZOLPIDEM TARTRATE 5 MG PO TABS
5.0000 mg | ORAL_TABLET | Freq: Every evening | ORAL | Status: DC | PRN
  Administered 2018-01-25 – 2018-01-30 (×5): 5 mg via ORAL
  Filled 2018-01-25 (×6): qty 1

## 2018-01-25 MED ORDER — INSULIN ASPART PROT & ASPART (70-30 MIX) 100 UNIT/ML ~~LOC~~ SUSP
30.0000 [IU] | Freq: Two times a day (BID) | SUBCUTANEOUS | Status: DC
  Administered 2018-01-25: 30 [IU] via SUBCUTANEOUS
  Filled 2018-01-25: qty 10

## 2018-01-25 MED ORDER — POTASSIUM CHLORIDE 10 MEQ/100ML IV SOLN
10.0000 meq | INTRAVENOUS | Status: AC
  Administered 2018-01-25 (×4): 10 meq via INTRAVENOUS
  Filled 2018-01-25 (×6): qty 100

## 2018-01-25 MED ORDER — INSULIN ASPART 100 UNIT/ML ~~LOC~~ SOLN
0.0000 [IU] | Freq: Every day | SUBCUTANEOUS | Status: DC
  Administered 2018-01-28: 3 [IU] via SUBCUTANEOUS
  Administered 2018-01-29: 4 [IU] via SUBCUTANEOUS
  Administered 2018-01-30: 3 [IU] via SUBCUTANEOUS
  Filled 2018-01-25 (×3): qty 1

## 2018-01-25 MED ORDER — LISINOPRIL 20 MG PO TABS
20.0000 mg | ORAL_TABLET | Freq: Every day | ORAL | Status: DC
  Administered 2018-01-25 – 2018-01-31 (×6): 20 mg via ORAL
  Filled 2018-01-25 (×7): qty 1

## 2018-01-25 MED ORDER — LOPERAMIDE HCL 2 MG PO CAPS
4.0000 mg | ORAL_CAPSULE | ORAL | Status: DC | PRN
  Administered 2018-01-26 – 2018-01-31 (×5): 4 mg via ORAL
  Filled 2018-01-25 (×5): qty 2

## 2018-01-25 MED ORDER — AMLODIPINE BESYLATE 10 MG PO TABS
10.0000 mg | ORAL_TABLET | Freq: Every day | ORAL | Status: DC
  Administered 2018-01-26 – 2018-01-29 (×4): 10 mg via ORAL
  Filled 2018-01-25 (×4): qty 1

## 2018-01-25 MED ORDER — POTASSIUM PHOSPHATES 15 MMOLE/5ML IV SOLN
20.0000 mmol | Freq: Once | INTRAVENOUS | Status: AC
  Administered 2018-01-25: 20 mmol via INTRAVENOUS
  Filled 2018-01-25: qty 6.67

## 2018-01-25 MED ORDER — POTASSIUM PHOSPHATES 15 MMOLE/5ML IV SOLN
30.0000 mmol | Freq: Once | INTRAVENOUS | Status: AC
  Administered 2018-01-25: 30 mmol via INTRAVENOUS
  Filled 2018-01-25: qty 10

## 2018-01-25 MED ORDER — INSULIN ASPART 100 UNIT/ML ~~LOC~~ SOLN
0.0000 [IU] | Freq: Three times a day (TID) | SUBCUTANEOUS | Status: DC
  Administered 2018-01-25 – 2018-01-26 (×2): 7 [IU] via SUBCUTANEOUS
  Administered 2018-01-26: 11 [IU] via SUBCUTANEOUS
  Administered 2018-01-26 – 2018-01-27 (×2): 3 [IU] via SUBCUTANEOUS
  Administered 2018-01-27: 11 [IU] via SUBCUTANEOUS
  Administered 2018-01-27: 7 [IU] via SUBCUTANEOUS
  Administered 2018-01-28 (×3): 11 [IU] via SUBCUTANEOUS
  Administered 2018-01-29: 15 [IU] via SUBCUTANEOUS
  Administered 2018-01-29: 20 [IU] via SUBCUTANEOUS
  Administered 2018-01-29: 11 [IU] via SUBCUTANEOUS
  Administered 2018-01-30: 15 [IU] via SUBCUTANEOUS
  Administered 2018-01-30: 7 [IU] via SUBCUTANEOUS
  Administered 2018-01-30: 09:00:00 15 [IU] via SUBCUTANEOUS
  Administered 2018-01-31 (×2): 11 [IU] via SUBCUTANEOUS
  Filled 2018-01-25 (×18): qty 1

## 2018-01-25 NOTE — Progress Notes (Signed)
Pharmacy Electrolyte Monitoring Consult:  Pharmacy consulted to assist in monitoring and replacing electrolytes in this 52 y.o. female admitted on 01/23/2018 with DKA.   Patient ordered potassium IV Q1hr x 3 doses and potassium phosphate IV x 1.   Labs:  Sodium (mmol/L)  Date Value  01/25/2018 148 (H)  03/29/2014 142   Potassium (mmol/L)  Date Value  01/25/2018 3.3 (L)  03/29/2014 3.4 (L)   Magnesium (mg/dL)  Date Value  62/69/4854 2.2  03/29/2014 1.6 (L)   Phosphorus (mg/dL)  Date Value  62/70/3500 1.9 (L)   Calcium (mg/dL)  Date Value  93/81/8299 8.7 (L)   Calcium, Total (mg/dL)  Date Value  37/16/9678 8.5   Albumin (g/dL)  Date Value  93/81/0175 3.8  03/29/2014 3.2 (L)    Assessment/Plan:   Potassium PO x 1. Will recheck potassium and phosphorus at 1800. Will recheck all other electrolytes with am labs.   7/18 1831 K=3.3, Phos= 1.9.  KDUR 40 meq po x 1 ordered. Potassium phosphate 30 mmol IV x 1.  F/u with am labs  Pharmacy will continue to monitor and adjust per consult.   Sidney Silberman A 01/25/2018 7:55 PM

## 2018-01-25 NOTE — Progress Notes (Signed)
Inpatient Diabetes Program Recommendations  AACE/ADA: New Consensus Statement on Inpatient Glycemic Control (2015)  Target Ranges:  Prepandial:   less than 140 mg/dL      Peak postprandial:   less than 180 mg/dL (1-2 hours)      Critically ill patients:  140 - 180 mg/dL   Results for Karen Lindsey, Karen Lindsey (MRN [id number]) as of 01/25/2018 13:27  Ref. Range 01/25/2018 00:03 01/25/2018 04:24 01/25/2018 07:25 01/25/2018 11:31 01/25/2018 12:49  Glucose-Capillary Latest Ref Range: 70 - 99 mg/dL 470 (H) 962 (H) 836 (H) 282 (H) 303 (H)   Review of Glycemic Control  Diabetes history: DM2 Outpatient Diabetes medications: Invokana 100 mg daily, 70/30 32 units BID, Metformin 500 mg QPM Current orders for Inpatient glycemic control: 70/30 30 units BID, Novolog 0-15 units TID with meals, Novolog 0-5 units QHS  Inpatient Diabetes Program Recommendations:  Insulin - Basal: Please consider discontinuing 70/30 since patient is not eating. Please consider ordering Lantus 50 units QHS. Correction (SSI): Please consider increasing Novolog correction to Resistant scale and changing frequency to Q4H. HgbA1C: A1C 11.4% on 11/08/17 indicating an average glucose of 280 mg/dl over the past 2-3 months.  Inpatient Diabetes Coordinator spoke with patient on 11/09/17 during last hospital admission regarding DM control.  NOTE: Spoke with Fleet Contras, RN regarding patient glycemic control. Patient is not eating on her own or with staff help. Glucose has continue to be elevated 258-303 mg/dl today.  Thanks, Orlando Penner, RN, MSN, CDE Diabetes Coordinator Inpatient Diabetes Program [phone] (Team Pager from 8am to 5pm)

## 2018-01-25 NOTE — Progress Notes (Signed)
Pharmacy Electrolyte Monitoring Consult:  Pharmacy consulted to assist in monitoring and replacing electrolytes in this 52 y.o. female admitted on 01/23/2018 with DKA.   Patient ordered potassium IV Q1hr x 3 doses and potassium phosphate IV x 1.   Labs:  Sodium (mmol/L)  Date Value  01/25/2018 148 (H)  03/29/2014 142   Potassium (mmol/L)  Date Value  01/25/2018 2.7 (LL)  03/29/2014 3.4 (L)   Magnesium (mg/dL)  Date Value  16/04/9603 2.2  03/29/2014 1.6 (L)   Phosphorus (mg/dL)  Date Value  54/03/8118 1.9 (L)   Calcium (mg/dL)  Date Value  14/78/2956 8.7 (L)   Calcium, Total (mg/dL)  Date Value  21/30/8657 8.5   Albumin (g/dL)  Date Value  84/69/6295 3.8  03/29/2014 3.2 (L)    Assessment/Plan:   Potassium PO x 1. Will recheck potassium and phosphorus at 1800. Will recheck all other electrolytes with am labs.   Pharmacy will continue to monitor and adjust per consult.   Simpson,Michael L 01/25/2018 1:47 PM

## 2018-01-25 NOTE — Evaluation (Signed)
Physical Therapy Evaluation Patient Details Name: Karen Lindsey MRN: [id number] DOB: 04-25-1966 Today's Date: 01/25/2018   History of Present Illness  Pt is a 52 year old female admitted for DKA following AMS.  PMH includes RA, narcotic addiction, DM, Htn, fatty liver and neuropathy.  Clinical Impression  Pt is a 52 year old female who lives in a one story home alone.  Pt is lethargic throughout evaluation with a flat affect and does not respond to questions consistently.  She required min A for all bed mobility and reported LE joint pain throughout.  Pt reported LE joint pain to be chronic.  She was able to sit at EOB without physical assist for balance but maintaining flexed posture.  Pt reported being to fatigued to stand, attempting 2-3 times while pulling back on RW.  PT educated pt concerning proper use of RW and fall prevention.  Pt requested to return to bed due to fatigue and PT assisted pt back to bed, Min A.  Pt presented with overall weakness of UE and LE bilaterally and reported neuropathy in bilateral feet.  She will continue to benefit from skilled PT with focus on strength, balance, tolerance to activity and pain management.    Follow Up Recommendations SNF    Equipment Recommendations  Rolling walker with 5" wheels    Recommendations for Other Services       Precautions / Restrictions Precautions Precautions: Fall Restrictions Weight Bearing Restrictions: No      Mobility  Bed Mobility Overal bed mobility: Needs Assistance Bed Mobility: Supine to Sit;Sit to Supine     Supine to sit: Min assist Sit to supine: Min assist   General bed mobility comments: Hand held assist to sit upright. Able to scoot up in bed with bed rails and use of LE's with +1 min A.  Transfers Overall transfer level: Needs assistance Equipment used: Rolling walker (2 wheeled) Transfers: Sit to/from Stand Sit to Stand: Min assist         General transfer comment: Tried to pull back on  walker to stand and PT offered assistance as well as education for use of RW.  Pt requested to return to bed, stating that she felt too weak to stand at this time.  Ambulation/Gait                Stairs            Wheelchair Mobility    Modified Rankin (Stroke Patients Only)       Balance Overall balance assessment: Needs assistance Sitting-balance support: Bilateral upper extremity supported;Feet supported Sitting balance-Leahy Scale: Fair Sitting balance - Comments: Remains flexed in posture when sitting.                                     Pertinent Vitals/Pain Pain Assessment: Faces Faces Pain Scale: Hurts little more Pain Location: LE joint pain with movement which pt reports to be chronic. Pain Intervention(s): Monitored during session    Home Living Family/patient expects to be discharged to:: Skilled nursing facility                      Prior Function Level of Independence: Independent         Comments: Pt reports being independent and able to obtain groceries on her own prior to admission.     Hand Dominance  Extremity/Trunk Assessment   Upper Extremity Assessment Upper Extremity Assessment: Generalized weakness    Lower Extremity Assessment Lower Extremity Assessment: Generalized weakness    Cervical / Trunk Assessment Cervical / Trunk Assessment: Normal  Communication   Communication: (Pt presents with a flat affect and speaks very softly when she is willing to respond.)  Cognition Arousal/Alertness: Lethargic Behavior During Therapy: Flat affect Overall Cognitive Status: No family/caregiver present to determine baseline cognitive functioning                                 General Comments: Follows commands inconsistently. Alert to self.      General Comments      Exercises     Assessment/Plan    PT Assessment Patient needs continued PT services  PT Problem List Decreased  strength;Decreased mobility;Decreased activity tolerance;Decreased knowledge of use of DME       PT Treatment Interventions DME instruction;Functional mobility training;Balance training;Stair training;Therapeutic exercise;Gait training;Therapeutic activities    PT Goals (Current goals can be found in the Care Plan section)  Acute Rehab PT Goals PT Goal Formulation: Patient unable to participate in goal setting    Frequency Min 2X/week   Barriers to discharge        Co-evaluation               AM-PAC PT "6 Clicks" Daily Activity  Outcome Measure Difficulty turning over in bed (including adjusting bedclothes, sheets and blankets)?: A Lot Difficulty moving from lying on back to sitting on the side of the bed? : A Lot Difficulty sitting down on and standing up from a chair with arms (e.g., wheelchair, bedside commode, etc,.)?: A Lot Help needed moving to and from a bed to chair (including a wheelchair)?: A Lot Help needed walking in hospital room?: A Lot Help needed climbing 3-5 steps with a railing? : A Lot 6 Click Score: 12    End of Session Equipment Utilized During Treatment: Gait belt Activity Tolerance: Patient limited by fatigue Patient left: in bed;with call bell/phone within reach;with bed alarm set Nurse Communication: Mobility status PT Visit Diagnosis: Muscle weakness (generalized) (M62.81)    Time: 0932-6712 PT Time Calculation (min) (ACUTE ONLY): 22 min   Charges:   PT Evaluation $PT Eval Moderate Complexity: 1 Mod     PT G Codes:   PT G-Codes **NOT FOR INPATIENT CLASS** Functional Assessment Tool Used: AM-PAC 6 Clicks Basic Mobility    Glenetta Hew, PT, DPT   Glenetta Hew 01/25/2018, 2:01 PM

## 2018-01-25 NOTE — Progress Notes (Signed)
Patient with 3 liquid stools, abdominal cramping, WBC of 17.2.  Ordered obtained to rule out c-diff.

## 2018-01-25 NOTE — Consult Note (Signed)
[facility] Consult   Reason for Consult: Follow-up consult 52 year old woman who came into the hospital with altered mental status Referring Physician: Bridgett Larsson Patient Identification: Karen Lindsey MRN:  [id number] Principal Diagnosis: Acute delirium Diagnosis:   Patient Active Problem List   Diagnosis Date Noted  . Narcotic abuse ([place]) [F11.10] 01/24/2018  . Acute delirium [R41.0] 01/24/2018  . DKA (diabetic ketoacidoses) ([place]) [E13.10] 01/23/2018  . AKI (acute kidney injury) ([place]) [N17.9] 01/23/2018  . HTN (hypertension) [I10] 01/23/2018  . Chest pain [R07.9] 11/08/2017    Total Time spent with patient: 30 minutes  Subjective:   Karen Lindsey is a 52 y.o. female patient admitted with "I feel sick".  HPI: Patient interviewed chart reviewed.  Follow-up on previous notes.  Patient had come into the hospital with altered mental status and with a drug screen only positive for methadone with it being unclear what medicine she had recently been taking.  Patient today was able to communicate much better.  She was alert and oriented.  She was able to tell me that she had stopped going to the methadone clinic about a week and a half ago because it was "too expensive".  She had been on a fairly high dose and she went off it cold [place].  Did not replace it with any other medicine.  This would explain why she is having diarrhea.  Mood is anxious and dysphoric but denies suicidal ideation.  No evidence of psychosis.  Social history: Patient does have some family and living support but not a lot.  Not currently working.  Medical history: Currently having diarrhea which was being worked up.  C. difficile level is negative.  Substance abuse history: Long history of abuse of multiple drugs but mainly opiates.  Past Psychiatric History: See previous note  Risk to Self:   Risk to Others:   Prior Inpatient Therapy:   Prior Outpatient Therapy:    Past Medical History:  Past Medical  History:  Diagnosis Date  . Arthritis    rheumatoid  . Chronic pain    a. upper/mid back.  . Diabetic peripheral neuropathy ([place])   . Fatty liver   . Hypertension   . Insulin dependent diabetes mellitus ([place])    a. Dx ~ 2011.  . Narcotic addiction ([place])    a. Followed in substance abuse center in [place] - on chronic methadone.  . Neuropathy   . RA (rheumatoid arthritis) ([place])   . Tobacco abuse     Past Surgical History:  Procedure Laterality Date  . ABDOMINAL HYSTERECTOMY    . CESAREAN SECTION    . CHOLECYSTECTOMY    . KNEE SURGERY    . MANDIBLE FRACTURE SURGERY     Family History: No family history on file. Family Psychiatric  History: See previous note Social History:  Social History   Substance and Sexual Activity  Alcohol Use No   Comment: Hasn't had a drink since 1998     Social History   Substance and Sexual Activity  Drug Use Yes   Comment: Previously abused pain pills. prescribed methadone     Social History   Socioeconomic History  . Marital status: Legally Separated    Spouse name: Not on file  . Number of children: Not on file  . Years of education: Not on file  . Highest education level: Not on file  Occupational History    Comment: Works part-time in [employer], [employer]  Social Needs  . Emergency planning/management officer  strain: Not on file  . Food insecurity:    Worry: Not on file    Inability: Not on file  . Transportation needs:    Medical: Not on file    Non-medical: Not on file  Tobacco Use  . Smoking status: Current Every Day Smoker    Packs/day: 0.50    Types: Cigarettes  . Smokeless tobacco: Never Used  . Tobacco comment: 30+ years  Substance and Sexual Activity  . Alcohol use: No    Comment: Hasn't had a drink since 1998  . Drug use: Yes    Comment: Previously abused pain pills. prescribed methadone   . Sexual activity: Not on file  Lifestyle  . Physical activity:    Days per week: Not on file    Minutes per session: Not on file  . Stress: Not  on file  Relationships  . Social connections:    Talks on phone: Not on file    Gets together: Not on file    Attends religious service: Not on file    Active member of club or organization: Not on file    Attends meetings of clubs or organizations: Not on file    Relationship status: Not on file  Other Topics Concern  . Not on file  Social History Narrative   Lives locally with friends/roomates.   Additional Social History:    Allergies:   Allergies  Allergen Reactions  . Ultram [Tramadol Hcl] Palpitations    Labs:  Results for orders placed or performed during the hospital encounter of 01/23/18 (from the past 48 hour(s))  Blood gas, venous     Status: Abnormal   Collection Time: 01/23/18 10:17 PM  Result Value Ref Range   FIO2 36.00    pH, Ven 7.15 (LL) 7.250 - 7.430    Comment: CRITICAL RESULT CALLED TO, READ BACK BY AND VERIFIED WITH: DR.GOODMAN AT [phone] [id number] BY SMATHEW RRT    pCO2, Ven 23 (L) 44.0 - 60.0 mmHg   pO2, Ven 34.0 32.0 - 45.0 mmHg   Bicarbonate 8.0 (L) 20.0 - 28.0 mmol/L   Acid-base deficit 18.9 (H) 0.0 - 2.0 mmol/L   O2 Saturation 45.6 %   Patient temperature 37.0    Collection site VENOUS    Sample type VENOUS     Comment: Performed at [facility], [address] [place], [place] [address]  CBC with Differential     Status: Abnormal   Collection Time: 01/23/18 10:17 PM  Result Value Ref Range   WBC 24.7 (H) 3.6 - 11.0 K/uL   RBC 5.79 (H) 3.80 - 5.20 MIL/uL   Hemoglobin 16.5 (H) 12.0 - 16.0 g/dL    Comment: RESULT REPEATED AND VERIFIED   HCT 53.2 (H) 35.0 - 47.0 %   MCV 91.9 80.0 - 100.0 fL   MCH 28.6 26.0 - 34.0 pg   MCHC 31.1 (L) 32.0 - 36.0 g/dL   RDW 14.5 11.5 - 14.5 %   Platelets 303 150 - 440 K/uL   Neutrophils Relative % 85 %   Lymphocytes Relative 5 %   Monocytes Relative 10 %   Eosinophils Relative 0 %   Basophils Relative 0 %   Neutro Abs 21.0 (H) 1.4 - 6.5 K/uL   Lymphs Abs 1.2 1.0 - 3.6 K/uL   Monocytes Absolute  2.5 (H) 0.2 - 0.9 K/uL   Eosinophils Absolute 0.0 0 - 0.7 K/uL   Basophils Absolute 0.0 0 - 0.1 K/uL   Smear Review MORPHOLOGY  UNREMARKABLE     Comment: Performed at [facility], [address] [place], [place] [address]  Comprehensive metabolic panel     Status: Abnormal   Collection Time: 01/23/18 10:17 PM  Result Value Ref Range   Sodium 138 135 - 145 mmol/L    Comment: ELECTROLYTES REPEATED. MSS   Potassium 2.6 (LL) 3.5 - 5.1 mmol/L    Comment: CRITICAL RESULT CALLED TO, READ BACK BY AND VERIFIED WITH REBECCA UHORCHUK RN AT 2310 01/23/18. MSS    Chloride 97 (L) 98 - 111 mmol/L    Comment: Please note change in reference range.   CO2 9 (L) 22 - 32 mmol/L   Glucose, Bld 1,064 (HH) 70 - 99 mg/dL    Comment: CRITICAL RESULT CALLED TO, READ BACK BY AND VERIFIED WITH REBECCA UHORCHUK RN AT 2310 01/23/18. MSS Please note change in reference range.    BUN 72 (H) 6 - 20 mg/dL    Comment: Please note change in reference range.   Creatinine, Ser 2.18 (H) 0.44 - 1.00 mg/dL   Calcium 9.2 8.9 - 10.3 mg/dL   Total Protein 6.8 6.5 - 8.1 g/dL   Albumin 3.8 3.5 - 5.0 g/dL   AST 17 15 - 41 U/L   ALT 20 0 - 44 U/L    Comment: Please note change in reference range.   Alkaline Phosphatase 82 38 - 126 U/L   Total Bilirubin 2.2 (H) 0.3 - 1.2 mg/dL   GFR calc non Af Amer 25 (L) >60 mL/min   GFR calc Af Amer 29 (L) >60 mL/min    Comment: (NOTE) The eGFR has been calculated using the CKD EPI equation. This calculation has not been validated in all clinical situations. eGFR's persistently <60 mL/min signify possible Chronic Kidney Disease.    Anion gap 32 (H) 5 - 15    Comment: Performed at [facility], [address] [place], [place] [address]  Urinalysis, Complete w Microscopic     Status: Abnormal   Collection Time: 01/23/18 10:30 PM  Result Value Ref Range   Color, Urine YELLOW (A) YELLOW   APPearance CLEAR (A) CLEAR   Specific Gravity, Urine 1.024 1.005 - 1.030    pH 5.0 5.0 - 8.0   Glucose, UA >=500 (A) NEGATIVE mg/dL   Hgb urine dipstick NEGATIVE NEGATIVE   Bilirubin Urine NEGATIVE NEGATIVE   Ketones, ur 20 (A) NEGATIVE mg/dL   Protein, ur 30 (A) NEGATIVE mg/dL   Nitrite NEGATIVE NEGATIVE   Leukocytes, UA NEGATIVE NEGATIVE   RBC / HPF 0-5 0 - 5 RBC/hpf   WBC, UA 0-5 0 - 5 WBC/hpf   Bacteria, UA RARE (A) NONE SEEN   Squamous Epithelial / LPF 0-5 0 - 5   Mucus PRESENT    Hyaline Casts, UA PRESENT     Comment: Performed at [facility], [address] [place], [place] [address]  Urine Drug Screen, Qualitative ([facility] only)     Status: Abnormal   Collection Time: 01/23/18 10:30 PM  Result Value Ref Range   Tricyclic, Ur Screen NONE DETECTED NONE DETECTED   Amphetamines, Ur Screen NONE DETECTED NONE DETECTED   MDMA (Ecstasy)Ur Screen NONE DETECTED NONE DETECTED   Cocaine Metabolite,Ur [place] NONE DETECTED NONE DETECTED   Opiate, Ur Screen NONE DETECTED NONE DETECTED   Phencyclidine (PCP) Ur S NONE DETECTED NONE DETECTED   Cannabinoid 50 Ng, Ur [place] NONE DETECTED NONE DETECTED   Barbiturates, Ur Screen (A) NONE DETECTED  Result not available. Reagent lot number recalled by manufacturer.   Benzodiazepine, Ur Scrn NONE DETECTED NONE DETECTED   Methadone Scn, Ur POSITIVE (A) NONE DETECTED    Comment: (NOTE) Tricyclics + metabolites, urine    Cutoff 1000 ng/mL Amphetamines + metabolites, urine  Cutoff 1000 ng/mL MDMA (Ecstasy), urine              Cutoff 500 ng/mL Cocaine Metabolite, urine          Cutoff 300 ng/mL Opiate + metabolites, urine        Cutoff 300 ng/mL Phencyclidine (PCP), urine         Cutoff 25 ng/mL Cannabinoid, urine                 Cutoff 50 ng/mL Barbiturates + metabolites, urine  Cutoff 200 ng/mL Benzodiazepine, urine              Cutoff 200 ng/mL Methadone, urine                   Cutoff 300 ng/mL The urine drug screen provides only a preliminary, unconfirmed analytical test result and should not be used for  non-medical purposes. Clinical consideration and professional judgment should be applied to any positive drug screen result due to possible interfering substances. A more specific alternate chemical method must be used in order to obtain a confirmed analytical result. Gas chromatography / mass spectrometry (GC/MS) is the preferred confirmat ory method. Performed at [facility], [address] [place], [place] [address]   Basic metabolic panel     Status: Abnormal   Collection Time: 01/24/18  1:06 AM  Result Value Ref Range   Sodium 146 (H) 135 - 145 mmol/L   Potassium 4.3 3.5 - 5.1 mmol/L   Chloride 118 (H) 98 - 111 mmol/L    Comment: Please note change in reference range.   CO2 9 (L) 22 - 32 mmol/L   Glucose, Bld 744 (HH) 70 - 99 mg/dL    Comment: CRITICAL RESULT CALLED TO, READ BACK BY AND VERIFIED WITH COLE AMORIELLO RN AT 0125 01/24/18. MSS Please note change in reference range.    BUN 66 (H) 6 - 20 mg/dL    Comment: Please note change in reference range.   Creatinine, Ser 1.64 (H) 0.44 - 1.00 mg/dL   Calcium 7.8 (L) 8.9 - 10.3 mg/dL   GFR calc non Af Amer 35 (L) >60 mL/min   GFR calc Af Amer 41 (L) >60 mL/min    Comment: (NOTE) The eGFR has been calculated using the CKD EPI equation. This calculation has not been validated in all clinical situations. eGFR's persistently <60 mL/min signify possible Chronic Kidney Disease.    Anion gap 19 (H) 5 - 15    Comment: Performed at [facility], [address] [place], [place] [address]  MRSA PCR Screening     Status: None   Collection Time: 01/24/18  1:27 AM  Result Value Ref Range   MRSA by PCR NEGATIVE NEGATIVE    Comment:        The GeneXpert MRSA Assay (FDA approved for NASAL specimens only), is one component of a comprehensive MRSA colonization surveillance program. It is not intended to diagnose MRSA infection nor to guide or monitor treatment for MRSA infections. Performed at [facility], [address] [place], [place] [address]   Glucose, capillary     Status: Abnormal   Collection Time: 01/24/18  2:09 AM  Result  Value Ref Range   Glucose-Capillary >600 (HH) 70 - 99 mg/dL  Basic metabolic panel     Status: Abnormal   Collection Time: 01/24/18  3:03 AM  Result Value Ref Range   Sodium 151 (H) 135 - 145 mmol/L   Potassium 3.4 (L) 3.5 - 5.1 mmol/L   Chloride 123 (H) 98 - 111 mmol/L    Comment: Please note change in reference range.   CO2 10 (L) 22 - 32 mmol/L   Glucose, Bld 594 (HH) 70 - 99 mg/dL    Comment: CRITICAL RESULT CALLED TO, READ BACK BY AND VERIFIED WITH JOSH WILLIAMSON AT 4818 ON 01/24/18 [place]. Please note change in reference range.    BUN 66 (H) 6 - 20 mg/dL    Comment: Please note change in reference range.   Creatinine, Ser 1.50 (H) 0.44 - 1.00 mg/dL   Calcium 7.9 (L) 8.9 - 10.3 mg/dL   GFR calc non Af Amer 39 (L) >60 mL/min   GFR calc Af Amer 45 (L) >60 mL/min    Comment: (NOTE) The eGFR has been calculated using the CKD EPI equation. This calculation has not been validated in all clinical situations. eGFR's persistently <60 mL/min signify possible Chronic Kidney Disease.    Anion gap 18 (H) 5 - 15    Comment: Performed at [facility], [address] [place], [place] [address]  CBC     Status: Abnormal   Collection Time: 01/24/18  3:03 AM  Result Value Ref Range   WBC 21.6 (H) 3.6 - 11.0 K/uL   RBC 5.24 (H) 3.80 - 5.20 MIL/uL   Hemoglobin 15.3 12.0 - 16.0 g/dL   HCT 45.6 35.0 - 47.0 %   MCV 86.9 80.0 - 100.0 fL   MCH 29.1 26.0 - 34.0 pg   MCHC 33.5 32.0 - 36.0 g/dL   RDW 13.6 11.5 - 14.5 %   Platelets 172 150 - 440 K/uL    Comment: Performed at [facility], [address] [place], [place] [address]  Lactic acid, plasma     Status: None   Collection Time: 01/24/18  3:03 AM  Result Value Ref Range   Lactic Acid, Venous 1.6 0.5 - 1.9 mmol/L    Comment: Performed at [facility], [address] [place], [place] [address]  Glucose, capillary     Status: Abnormal   Collection Time: 01/24/18  3:46 AM  Result Value Ref Range   Glucose-Capillary 561 (HH) 70 - 99 mg/dL   Comment 1 Notify RN   Glucose, capillary     Status: Abnormal   Collection Time: 01/24/18  4:57 AM  Result Value Ref Range   Glucose-Capillary 461 (H) 70 - 99 mg/dL  Blood gas, arterial     Status: Abnormal (Preliminary result)   Collection Time: 01/24/18  5:00 AM  Result Value Ref Range   FIO2 0.21    Delivery systems ROOM AIR    pH, Arterial 7.29 (L) 7.350 - 7.450   pCO2 arterial 19 (LL) 32.0 - 48.0 mmHg    Comment: CRITICAL RESULT CALLED TO, READ BACK BY AND VERIFIED WITH: BLAKENEY D NP AT [phone] 01/24/2018 BY S DAVID RRT     pO2, Arterial 89 83.0 - 108.0 mmHg   Bicarbonate PENDING 20.0 - 28.0 mmol/L   O2 Saturation PENDING %   Patient temperature 37.0    Collection site REVIEWED BY    Sample type ARTERIAL DRAW     Comment: Performed at [facility], [place], [place] [address]  Glucose, capillary     Status: Abnormal   Collection Time: 01/24/18  6:05 AM  Result Value Ref Range   Glucose-Capillary 289 (H) 70 - 99 mg/dL  Basic metabolic panel     Status: Abnormal   Collection Time: 01/24/18  6:25 AM  Result Value Ref Range   Sodium 153 (H) 135 - 145 mmol/L   Potassium 2.7 (LL) 3.5 - 5.1 mmol/L    Comment: CRITICAL RESULT CALLED TO, READ BACK BY AND VERIFIED WITH JOSH WILLIAMSON AT 4782 ON 01/24/18 [place].    Chloride 128 (H) 98 - 111 mmol/L    Comment: Please note change in reference range.   CO2 15 (L) 22 - 32 mmol/L   Glucose, Bld 406 (H) 70 - 99 mg/dL    Comment: Please note change in reference range.   BUN 63 (H) 6 - 20 mg/dL    Comment: Please note change in reference range.   Creatinine, Ser 1.24 (H) 0.44 - 1.00 mg/dL   Calcium 8.5 (L) 8.9 - 10.3 mg/dL   GFR calc non Af Amer 49 (L) >60 mL/min   GFR calc Af Amer 57 (L) >60 mL/min    Comment: (NOTE) The eGFR has been  calculated using the CKD EPI equation. This calculation has not been validated in all clinical situations. eGFR's persistently <60 mL/min signify possible Chronic Kidney Disease.    Anion gap 10 5 - 15    Comment: Performed at [facility], [place], [place] [address]  Procalcitonin - Baseline     Status: None   Collection Time: 01/24/18  6:25 AM  Result Value Ref Range   Procalcitonin 1.49 ng/mL    Comment:        Interpretation: PCT > 0.5 ng/mL and <= 2 ng/mL: Systemic infection (sepsis) is possible, but other conditions are known to elevate PCT as well. (NOTE)       Sepsis PCT Algorithm           Lower Respiratory Tract                                      Infection PCT Algorithm    ----------------------------     ----------------------------         PCT < 0.25 ng/mL                PCT < 0.10 ng/mL         Strongly encourage             Strongly discourage   discontinuation of antibiotics    initiation of antibiotics    ----------------------------     -----------------------------       PCT 0.25 - 0.50 ng/mL            PCT 0.10 - 0.25 ng/mL               OR       >80% decrease in PCT            Discourage initiation of                                            antibiotics      Encourage discontinuation  of antibiotics    ----------------------------     -----------------------------         PCT >= 0.50 ng/mL              PCT 0.26 - 0.50 ng/mL                AND       <80% decrease in PCT             Encourage initiation of                                             antibiotics       Encourage continuation           of antibiotics    ----------------------------     -----------------------------        PCT >= 0.50 ng/mL                  PCT > 0.50 ng/mL               AND         increase in PCT                  Strongly encourage                                      initiation of antibiotics    Strongly encourage escalation            of antibiotics                                     -----------------------------                                           PCT <= 0.25 ng/mL                                                 OR                                        > 80% decrease in PCT                                     Discontinue / Do not initiate                                             antibiotics Performed at [facility], [address] [place], [place] [address]   Glucose, capillary     Status: Abnormal   Collection Time: 01/24/18  7:06 AM  Result Value Ref Range   Glucose-Capillary 261 (H) 70 - 99 mg/dL  Glucose,  capillary     Status: Abnormal   Collection Time: 01/24/18  8:42 AM  Result Value Ref Range   Glucose-Capillary 258 (H) 70 - 99 mg/dL  Glucose, capillary     Status: Abnormal   Collection Time: 01/24/18  9:44 AM  Result Value Ref Range   Glucose-Capillary 217 (H) 70 - 99 mg/dL   Comment 1 Notify RN   Basic metabolic panel     Status: Abnormal   Collection Time: 01/24/18 10:26 AM  Result Value Ref Range   Sodium 153 (H) 135 - 145 mmol/L   Potassium 3.3 (L) 3.5 - 5.1 mmol/L   Chloride >130 (HH) 98 - 111 mmol/L    Comment: CRITICAL RESULT CALLED TO, READ BACK BY AND VERIFIED WITH C/FELICIA PREUDHOMME, RN 01/24/18 1105 JML/DAS Please note change in reference range.    CO2 17 (L) 22 - 32 mmol/L   Glucose, Bld 239 (H) 70 - 99 mg/dL    Comment: Please note change in reference range.   BUN 59 (H) 6 - 20 mg/dL    Comment: Please note change in reference range.   Creatinine, Ser 0.92 0.44 - 1.00 mg/dL   Calcium 8.5 (L) 8.9 - 10.3 mg/dL   GFR calc non Af Amer >60 >60 mL/min   GFR calc Af Amer >60 >60 mL/min    Comment: (NOTE) The eGFR has been calculated using the CKD EPI equation. This calculation has not been validated in all clinical situations. eGFR's persistently <60 mL/min signify possible Chronic Kidney Disease.    Anion gap NOT CALCULATED 5 - 15    Comment: Performed at  [facility], [address] [place], [place] [address]  Magnesium     Status: None   Collection Time: 01/24/18 10:26 AM  Result Value Ref Range   Magnesium 2.4 1.7 - 2.4 mg/dL    Comment: Performed at [facility], [address] [place], [place] [address]  CULTURE, BLOOD (ROUTINE X 2) w Reflex to ID Panel     Status: None (Preliminary result)   Collection Time: 01/24/18 10:46 AM  Result Value Ref Range   Specimen Description BLOOD RIGHT AC    Special Requests      BOTTLES DRAWN AEROBIC AND ANAEROBIC Blood Culture adequate volume   Culture      NO GROWTH < 24 HOURS Performed at [facility], [address], [place], [place] [address]    Report Status PENDING   Glucose, capillary     Status: Abnormal   Collection Time: 01/24/18 10:47 AM  Result Value Ref Range   Glucose-Capillary 205 (H) 70 - 99 mg/dL  CULTURE, BLOOD (ROUTINE X 2) w Reflex to ID Panel     Status: None (Preliminary result)   Collection Time: 01/24/18 11:49 AM  Result Value Ref Range   Specimen Description BLOOD RIGHT ARM    Special Requests      BOTTLES DRAWN AEROBIC ONLY Blood Culture results may not be optimal due to an inadequate volume of blood received in culture bottles   Culture      NO GROWTH < 24 HOURS Performed at [facility], [address], [place], [place] [address]    Report Status PENDING   Glucose, capillary     Status: Abnormal   Collection Time: 01/24/18 11:52 AM  Result Value Ref Range   Glucose-Capillary 237 (H) 70 - 99 mg/dL  Basic metabolic panel     Status: Abnormal   Collection Time: 01/24/18  2:01 PM  Result Value Ref Range   Sodium 152 (H) 135 - 145 mmol/L   Potassium 4.0 3.5 - 5.1 mmol/L   Chloride 125 (H) 98 - 111 mmol/L    Comment: Please note change in reference range.   CO2 16 (L) 22 - 32 mmol/L   Glucose, Bld 364 (H) 70 - 99 mg/dL    Comment: Please note change in reference range.   BUN 58 (H) 6 - 20 mg/dL    Comment: Please note  change in reference range.   Creatinine, Ser 0.93 0.44 - 1.00 mg/dL   Calcium 8.8 (L) 8.9 - 10.3 mg/dL   GFR calc non Af Amer >60 >60 mL/min   GFR calc Af Amer >60 >60 mL/min    Comment: (NOTE) The eGFR has been calculated using the CKD EPI equation. This calculation has not been validated in all clinical situations. eGFR's persistently <60 mL/min signify possible Chronic Kidney Disease.    Anion gap 11 5 - 15    Comment: Performed at [facility], [address] [place], [place] [address]  Glucose, capillary     Status: Abnormal   Collection Time: 01/24/18  4:02 PM  Result Value Ref Range   Glucose-Capillary 326 (H) 70 - 99 mg/dL  Basic metabolic panel     Status: Abnormal   Collection Time: 01/24/18  5:56 PM  Result Value Ref Range   Sodium 150 (H) 135 - 145 mmol/L   Potassium 3.2 (L) 3.5 - 5.1 mmol/L   Chloride 123 (H) 98 - 111 mmol/L    Comment: Please note change in reference range.   CO2 18 (L) 22 - 32 mmol/L   Glucose, Bld 357 (H) 70 - 99 mg/dL    Comment: Please note change in reference range.   BUN 50 (H) 6 - 20 mg/dL    Comment: Please note change in reference range.   Creatinine, Ser 0.80 0.44 - 1.00 mg/dL   Calcium 9.0 8.9 - 10.3 mg/dL   GFR calc non Af Amer >60 >60 mL/min   GFR calc Af Amer >60 >60 mL/min    Comment: (NOTE) The eGFR has been calculated using the CKD EPI equation. This calculation has not been validated in all clinical situations. eGFR's persistently <60 mL/min signify possible Chronic Kidney Disease.    Anion gap 9 5 - 15    Comment: Performed at [facility], [address] [place], [place] [address]  Glucose, capillary     Status: Abnormal   Collection Time: 01/24/18  7:44 PM  Result Value Ref Range   Glucose-Capillary 265 (H) 70 - 99 mg/dL  Glucose, capillary     Status: Abnormal   Collection Time: 01/25/18 12:03 AM  Result Value Ref Range   Glucose-Capillary 214 (H) 70 - 99 mg/dL  Basic metabolic panel      Status: Abnormal   Collection Time: 01/25/18 12:52 AM  Result Value Ref Range   Sodium 150 (H) 135 - 145 mmol/L   Potassium 2.7 (LL) 3.5 - 5.1 mmol/L    Comment: CRITICAL RESULT CALLED TO, READ BACK BY AND VERIFIED WITH TONY WALKER @0126  01/25/18 [facility]    Chloride 121 (H) 98 - 111 mmol/L    Comment: Please note change in reference range.   CO2 20 (L) 22 - 32 mmol/L   Glucose, Bld 247 (H) 70 - 99 mg/dL    Comment: Please note change in reference range.   BUN 43 (H) 6 - 20  mg/dL    Comment: Please note change in reference range.   Creatinine, Ser 0.66 0.44 - 1.00 mg/dL   Calcium 9.0 8.9 - 10.3 mg/dL   GFR calc non Af Amer >60 >60 mL/min   GFR calc Af Amer >60 >60 mL/min    Comment: (NOTE) The eGFR has been calculated using the CKD EPI equation. This calculation has not been validated in all clinical situations. eGFR's persistently <60 mL/min signify possible Chronic Kidney Disease.    Anion gap 9 5 - 15    Comment: Performed at [facility], [address] [place], [place] [address]  Magnesium     Status: None   Collection Time: 01/25/18 12:52 AM  Result Value Ref Range   Magnesium 2.2 1.7 - 2.4 mg/dL    Comment: Performed at [facility], [address] [place], [place] [address]  Glucose, capillary     Status: Abnormal   Collection Time: 01/25/18  4:24 AM  Result Value Ref Range   Glucose-Capillary 288 (H) 70 - 99 mg/dL  Basic metabolic panel     Status: Abnormal   Collection Time: 01/25/18  4:42 AM  Result Value Ref Range   Sodium 148 (H) 135 - 145 mmol/L   Potassium 2.7 (LL) 3.5 - 5.1 mmol/L    Comment: CRITICAL RESULT CALLED TO, READ BACK BY AND VERIFIED WITH TONY WALKER @0550  01/25/18 [facility]    Chloride 118 (H) 98 - 111 mmol/L    Comment: Please note change in reference range.   CO2 21 (L) 22 - 32 mmol/L   Glucose, Bld 296 (H) 70 - 99 mg/dL    Comment: Please note change in reference range.   BUN 41 (H) 6 - 20 mg/dL    Comment: Please note change  in reference range.   Creatinine, Ser 0.68 0.44 - 1.00 mg/dL   Calcium 8.7 (L) 8.9 - 10.3 mg/dL   GFR calc non Af Amer >60 >60 mL/min   GFR calc Af Amer >60 >60 mL/min    Comment: (NOTE) The eGFR has been calculated using the CKD EPI equation. This calculation has not been validated in all clinical situations. eGFR's persistently <60 mL/min signify possible Chronic Kidney Disease.    Anion gap 9 5 - 15    Comment: Performed at [facility], [address] [place], [place] [address]  Brain natriuretic peptide     Status: None   Collection Time: 01/25/18  4:42 AM  Result Value Ref Range   B Natriuretic Peptide 62.0 0.0 - 100.0 pg/mL    Comment: Performed at [facility], [address] [place],  [address]  CBC with Differential/Platelet     Status: Abnormal   Collection Time: 01/25/18  4:42 AM  Result Value Ref Range   WBC 17.2 (H) 3.6 - 11.0 K/uL   RBC 4.88 3.80 - 5.20 MIL/uL   Hemoglobin 14.1 12.0 - 16.0 g/dL   HCT 41.6 35.0 - 47.0 %   MCV 85.2 80.0 - 100.0 fL   MCH 28.9 26.0 - 34.0 pg   MCHC 33.9 32.0 - 36.0 g/dL   RDW 13.1 11.5 - 14.5 %   Platelets 143 (L) 150 - 440 K/uL   Neutrophils Relative % 76 %   Neutro Abs 13.1 (H) 1.4 - 6.5 K/uL   Lymphocytes Relative 16 %   Lymphs Abs 2.8 1.0 - 3.6 K/uL   Monocytes Relative 7 %   Monocytes Absolute 1.1 (H) 0.2 - 0.9 K/uL  Eosinophils Relative 0 %   Eosinophils Absolute 0.0 0 - 0.7 K/uL   Basophils Relative 1 %   Basophils Absolute 0.1 0 - 0.1 K/uL    Comment: Performed at [facility], [address] [place], [place] [address]  Phosphorus     Status: Abnormal   Collection Time: 01/25/18  4:42 AM  Result Value Ref Range   Phosphorus 1.9 (L) 2.5 - 4.6 mg/dL    Comment: Performed at [facility], [address] [place], [place] [address]  Glucose, capillary     Status: Abnormal   Collection Time: 01/25/18  7:25 AM  Result Value Ref Range   Glucose-Capillary 258 (H) 70 - 99  mg/dL   Comment 1 Notify RN   Glucose, capillary     Status: Abnormal   Collection Time: 01/25/18 11:31 AM  Result Value Ref Range   Glucose-Capillary 282 (H) 70 - 99 mg/dL  Glucose, capillary     Status: Abnormal   Collection Time: 01/25/18 12:49 PM  Result Value Ref Range   Glucose-Capillary 303 (H) 70 - 99 mg/dL  C difficile quick scan w PCR reflex     Status: None   Collection Time: 01/25/18  2:49 PM  Result Value Ref Range   C Diff antigen NEGATIVE NEGATIVE   C Diff toxin NEGATIVE NEGATIVE   C Diff interpretation No C. difficile detected.     Comment: Performed at [facility], [address] [place], [place] [address]  Glucose, capillary     Status: Abnormal   Collection Time: 01/25/18  4:42 PM  Result Value Ref Range   Glucose-Capillary 216 (H) 70 - 99 mg/dL    Current Facility-Administered Medications  Medication Dose Route Frequency Provider Last Rate Last Dose  . 0.45 % sodium chloride infusion   Intravenous Continuous Lahoma Rocker, MD 50 mL/hr at 01/25/18 1726    . acetaminophen (TYLENOL) tablet 650 mg  650 mg Oral Q6H PRN Awilda Bill, NP   650 mg at 01/24/18 2033  . [START ON 01/26/2018] amLODipine (NORVASC) tablet 10 mg  10 mg Oral Daily Demetrios Loll, MD      . buprenorphine-naloxone (SUBOXONE) 8-2 mg per SL tablet 1 tablet  1 tablet Sublingual BID Doraine Schexnider T, MD      . famotidine (PEPCID) tablet 20 mg  20 mg Oral BID Lahoma Rocker, MD   20 mg at 01/25/18 1007  . hydrALAZINE (APRESOLINE) injection 10-20 mg  10-20 mg Intravenous Q4H PRN Awilda Bill, NP   20 mg at 01/24/18 2359  . insulin aspart (novoLOG) injection 0-20 Units  0-20 Units Subcutaneous TID WC Demetrios Loll, MD   7 Units at 01/25/18 1725  . insulin aspart (novoLOG) injection 0-5 Units  0-5 Units Subcutaneous QHS Demetrios Loll, MD      . insulin glargine (LANTUS) injection 50 Units  50 Units Subcutaneous QHS Demetrios Loll, MD      . lisinopril (PRINIVIL,ZESTRIL) tablet 20 mg  20 mg Oral Daily Lahoma Rocker, MD   20 mg at 01/25/18 1007  . loperamide (IMODIUM) capsule 2 mg  2 mg Oral Q6H PRN Demetrios Loll, MD   2 mg at 01/25/18 1725  . loperamide (IMODIUM) capsule 4 mg  4 mg Oral PRN Davieon Stockham T, MD      . nicotine (NICODERM CQ - dosed in mg/24 hours) patch 14 mg  14 mg Transdermal Daily Lahoma Rocker, MD   14 mg at 01/25/18 1008  .  protein supplement (PREMIER PROTEIN) liquid - approved for s/p bariatric surgery  11 oz Oral BID BM Lahoma Rocker, MD   11 oz at 01/25/18 1041    Musculoskeletal: Strength & Muscle Tone: within normal limits Gait & Station: normal Patient leans: N/A  Psychiatric Specialty Exam: Physical Exam  Nursing note and vitals reviewed. Constitutional: She appears well-developed and well-nourished.  HENT:  Head: Normocephalic and atraumatic.  Eyes: Pupils are equal, round, and reactive to light. Conjunctivae are normal.  Neck: Normal range of motion.  Cardiovascular: Normal heart sounds.  Respiratory: Effort normal.  GI: Soft.  Musculoskeletal: Normal range of motion.  Neurological: She is alert.  Skin: Skin is warm and dry.  Psychiatric: Judgment normal. Her mood appears anxious. Her affect is blunt. Her speech is delayed. She is slowed. Cognition and memory are normal. She expresses no homicidal and no suicidal ideation.    Review of Systems  Constitutional: Negative.   HENT: Negative.   Eyes: Negative.   Respiratory: Negative.   Cardiovascular: Negative.   Gastrointestinal: Positive for abdominal pain and diarrhea.  Musculoskeletal: Negative.   Skin: Negative.   Neurological: Negative.   Psychiatric/Behavioral: Positive for depression. Negative for suicidal ideas.    Blood pressure (!) 157/79, pulse 79, temperature 98.3 F (36.8 C), temperature source Oral, resp. rate 20, height 5' 1"  (1.549 m), weight 59 kg (130 lb), SpO2 99 %.Body mass index is 24.56 kg/m.  General Appearance: Casual  Eye Contact:  Fair  Speech:  Slow  Volume:  Decreased  Mood:   Dysphoric  Affect:  Congruent  Thought Process:  Goal Directed  Orientation:  Full (Time, Place, and Person)  Thought Content:  Logical  Suicidal Thoughts:  No  Homicidal Thoughts:  No  Memory:  Immediate;   Fair Recent;   Fair Remote;   Fair  Judgement:  Fair  Insight:  Fair  Psychomotor Activity:  Decreased  Concentration:  Concentration: Fair  Recall:  [employer] of Knowledge:  Fair  Language:  Fair  Akathisia:  No  Handed:  Right  AIMS (if indicated):     Assets:  Desire for Improvement Housing Resilience  ADL's:  Impaired  Cognition:  Impaired,  Mild  Sleep:        Treatment Plan Summary: Daily contact with patient to assess and evaluate symptoms and progress in treatment, Medication management and Plan History that I got today confirms that the patient is probably having opiate withdrawal as the primary problem here.  Discussed this with the patient.  Once we found out that she did not have Clostridium difficile I have ordered Suboxone for 3 days and also suggested the addition of Imodium.  I will follow-up as needed and once she is a little more comfortable.  Disposition: No evidence of imminent risk to self or others at present.   Patient does not meet criteria for psychiatric inpatient admission.  Alethia Berthold, MD 01/25/2018 6:46 PM

## 2018-01-25 NOTE — Progress Notes (Signed)
PT Cancellation Note  Patient Details Name: Karen Lindsey MRN: [id number] DOB: 1966-06-11   Cancelled Treatment:    Reason Eval/Treat Not Completed: Patient declined, no reason specified.  Pt currently using bedpan.  Will re-attempt later if time allows.   Glenetta Hew,  PT, DPT 01/25/2018, 1:09 PM

## 2018-01-25 NOTE — Progress Notes (Signed)
PT Cancellation Note  Patient Details Name: Karen Lindsey MRN: [id number] DOB: Jan 04, 1966   Cancelled Treatment:    Reason Eval/Treat Not Completed: Medical issues which prohibited therapy.  Order received.  Chart reviewed.  Pt's potassium level at a critical low.  Will re-attempt later when pt more medically appropriate.   Glenetta Hew, PT, DPT 01/25/2018, 8:25 AM

## 2018-01-25 NOTE — Progress Notes (Signed)
OT Cancellation Note  Patient Details Name: Karen Lindsey MRN: [id number] DOB: 14-Nov-1965   Cancelled Treatment:    Reason Eval/Treat Not Completed: Medical issues which prohibited therapy. Order received, chart reviewed. Pt noted with elevated BP this am and critical value for potassium (2.7). Per therapy guidelines, pt not medically appropriate for therapy at this time. Will re-attempt at later date/time as pt is more medically appropriate.   Richrd Prime, MPH, MS, OTR/L ascom [phone] 01/25/18, 10:28 AM

## 2018-01-25 NOTE — Progress Notes (Addendum)
[facility] - [place] at [facility]   PATIENT NAME: Karen Lindsey    MR#:  [id number]  DATE OF BIRTH:  1966/01/12  SUBJECTIVE:  CHIEF COMPLAINT:   Chief Complaint  Patient presents with  . Altered Mental Status   Patient is awake and alert.  She looks depressed.  She refused to eat.  She had several times diarrhea per RN. REVIEW OF SYSTEMS:  Review of Systems  Constitutional: Negative for chills, fever and malaise/fatigue.  HENT: Negative for sore throat.   Eyes: Negative for blurred vision and double vision.  Respiratory: Negative for cough, hemoptysis, shortness of breath, wheezing and stridor.   Cardiovascular: Negative for chest pain, palpitations, orthopnea and leg swelling.  Gastrointestinal: Positive for diarrhea. Negative for abdominal pain, blood in stool, melena, nausea and vomiting.  Genitourinary: Negative for dysuria, flank pain and hematuria.  Musculoskeletal: Negative for back pain and joint pain.  Neurological: Negative for dizziness, sensory change, focal weakness, seizures, loss of consciousness, weakness and headaches.  Endo/Heme/Allergies: Negative for polydipsia.  Psychiatric/Behavioral: Negative for depression. The patient is not nervous/anxious.     DRUG ALLERGIES:   Allergies  Allergen Reactions  . Ultram [Tramadol Hcl] Palpitations   VITALS:  Blood pressure (!) 157/79, pulse 79, temperature 98.3 F (36.8 C), temperature source Oral, resp. rate 20, height 5\' 1"  (1.549 m), weight 130 lb (59 kg), SpO2 99 %. PHYSICAL EXAMINATION:  Physical Exam  Constitutional: She is oriented to person, place, and time. She appears well-developed. No distress.  HENT:  Head: Normocephalic.  Eyes: Pupils are equal, round, and reactive to light. Conjunctivae are normal. No scleral icterus.  Neck: Neck supple. No JVD present. No tracheal deviation present.  Cardiovascular: Regular rhythm and normal heart sounds. Exam reveals no gallop.  No murmur  heard. Tachycardia.  Pulmonary/Chest: Effort normal and breath sounds normal. No respiratory distress. She has no wheezes. She has no rales.  Abdominal: Soft. Bowel sounds are normal. She exhibits no distension. There is tenderness. There is no rebound.  Musculoskeletal: She exhibits no edema or tenderness.  Neurological: She is alert and oriented to person, place, and time. No cranial nerve deficit.  Skin: No rash noted. No erythema.  Psychiatric:  Looks depressed.   LABORATORY PANEL:  Female CBC Recent Labs  Lab 01/25/18 0442  WBC 17.2*  HGB 14.1  HCT 41.6  PLT 143*   ------------------------------------------------------------------------------------------------------------------ Chemistries  Recent Labs  Lab 01/23/18 2217  01/25/18 0052 01/25/18 0442  NA 138   < > 150* 148*  K 2.6*   < > 2.7* 2.7*  CL 97*   < > 121* 118*  CO2 9*   < > 20* 21*  GLUCOSE 1,064*   < > 247* 296*  BUN 72*   < > 43* 41*  CREATININE 2.18*   < > 0.66 0.68  CALCIUM 9.2   < > 9.0 8.7*  MG  --    < > 2.2  --   AST 17  --   --   --   ALT 20  --   --   --   ALKPHOS 82  --   --   --   BILITOT 2.2*  --   --   --    < > = values in this interval not displayed.   RADIOLOGY:  No results found. ASSESSMENT AND PLAN:   DKA (diabetic ketoacidoses)  Improved with IV insulin and IV fluid support.  Hyperglycemia due to diabetes 2.  Increased NovoLog 70/30 30 units twice daily and continue sliding scale.  Per diabetes coordinator, may discontinue 70/30 since the patient is not eating.  Consider Lantus 50 units at bedtime.  Hypokalemia.  Continue IV and p.o. potassium, follow-up level. Hypernatremia.  On D5 IV, follow-up sodium level.    AKI (acute kidney injury) (HCC) -related to DKA as above, still dehydration, improving with IV fluid support.  Hypernatremia.  Continue 1/2NS IV, follow-up sodium level.  Leukocytosis.  Possible due to reaction to DKA, no source of infection so far.  Follow-up  CBC.  Acute metabolic encephalopathy due to above.  Improving.   HTN (hypertension), resumed lisinopril and HCTZ.  Discontinue HCTZ due to hypokalemia.  Start Norvasc.   Narcotic abuse.  Would not start methadone or any other narcotic medication either for detox for maintenance this moment without further information per Dr. Toni Amend.  Diarrhea.  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  Imodium as needed.  All the records are reviewed and case discussed with Care Management/Social Worker. Management plans discussed with the patient, family and they are in agreement.  CODE STATUS: Full Code  TOTAL TIME TAKING CARE OF THIS PATIENT: 37 minutes.   More than 50% of the time was spent in counseling/coordination of care: YES  POSSIBLE D/C IN 2-3 DAYS, DEPENDING ON CLINICAL CONDITION.   Shaune Pollack M.D on 01/25/2018 at 4:32 PM  Between 7am to 6pm - Pager - [phone]  After 6pm go to [email or web address] - Therapist, nutritional Hospitalists

## 2018-01-25 NOTE — Progress Notes (Signed)
Telephone report called to [facility]  Patient transported to room 243 via bed with RN present.

## 2018-01-25 NOTE — Progress Notes (Signed)
Name: Karen Lindsey MRN: [id number] DOB: 1965/08/04    ADMISSION DATE:  01/23/2018 CONSULTATION DATE: 01/24/2018  REFERRING MD : Dr. Anne Hahn   CHIEF COMPLAINT: Altered Mental Status   BRIEF PATIENT DESCRIPTION:  52 yo female admitted with acute renal failure and acute encephalopathy secondary to possible narcotic use and DKA requiring insulin gtt  SIGNIFICANT EVENTS/STUDIES:  07/16 Pt admitted to ICU   HISTORY OF PRESENT ILLNESS:   This is a 53 yo female with a PMH of Tobacco Abuse, Rheumatoid Arthritis, Diabetic Peripheral Neuropathy, Narcotic Addiction (currently on methadone), Insulin Dependent Diabetes Mellitus, HTN, Fatty Liver, and Chronic Pain.  She presented to [facility] ER on 07/16 via EMS with unresponsiveness and hyperglycemia CBG 539.  Per ER notes en route to the ER she received iv narcan x1 dose with improvement in mentation.  Upon arrival to the ER she was alert and able to follow commands.  She reported she has been noncompliant with taking her insulin and she reported low back pain.  Lab results ruled pt in for DKA, therefore DKA protocol initiated.  She was subsequently admitted to ICU by hospitalist team for further workup and treatment.   01/25/18: Doing ok overnight Improvement in anion gap -- transitioned to 70/30 and now BS slightly high so increased Does not participate in any activity -- psych eval appreciated- suggested avoid any kind of opiates K and phos is low and is replaced   Prior to Admission medications   Medication Sig Start Date End Date Taking? Authorizing Provider  baclofen (LIORESAL) 10 MG tablet Take 1 tablet (10 mg total) by mouth 3 (three) times daily. Patient not taking: Reported on 05/09/2017 10/20/15   Kem Boroughs B, FNP  benzonatate (TESSALON PERLES) 100 MG capsule Take 1 capsule (100 mg total) by mouth every 6 (six) hours as needed for cough. 08/06/17 08/06/18  Jeanmarie Plant, MD  Calcium Carb-Ergocalciferol 500-200 MG-UNIT TABS Take 1 tablet  by mouth daily.    [provider]  canagliflozin (INVOKANA) 100 MG TABS tablet Take 100 mg by mouth daily. 02/24/17   [provider]  cephALEXin (KEFLEX) 500 MG capsule Take 1 capsule (500 mg total) by mouth every 12 (twelve) hours. 11/09/17   Salary, Evelena Asa, MD  cyclobenzaprine (FLEXERIL) 5 MG tablet Take 1 tablet (5 mg total) by mouth 3 (three) times daily as needed for muscle spasms. Patient not taking: Reported on 05/09/2017 11/02/16   Menshew, Charlesetta Ivory, PA-C  diclofenac (VOLTAREN) 50 MG EC tablet Take 1 tablet (50 mg total) by mouth 2 (two) times daily. Patient not taking: Reported on 05/09/2017 11/02/16   Menshew, Charlesetta Ivory, PA-C  DOCOSAHEXAENOIC ACID PO Take 1 capsule by mouth daily.    [provider]  famotidine (PEPCID) 20 MG tablet Take 1 tablet (20 mg total) by mouth 2 (two) times daily. 09/15/17 09/15/18  Merrily Brittle, MD  fluconazole (DIFLUCAN) 150 MG tablet Take 1 tablet (150 mg total) by mouth daily. 11/10/17   Salary, Evelena Asa, MD  gabapentin (NEURONTIN) 300 MG capsule Take 1,200 mg by mouth 3 (three) times daily.     [provider]  glucosamine-chondroitin 500-400 MG tablet Take 1 tablet by mouth daily.    [provider]  ibuprofen (ADVIL,MOTRIN) 200 MG tablet Take 200 mg by mouth every 6 (six) hours as needed.    [provider]  ibuprofen (ADVIL,MOTRIN) 600 MG tablet Take 1 tablet (600 mg total) every 8 (eight) hours as needed by  mouth. 05/24/17   Merrily Brittle, MD  insulin NPH Human (HUMULIN N) 100 UNIT/ML injection Inject 0.4 mLs (40 Units total) into the skin at bedtime. Patient not taking: Reported on 05/09/2017 05/02/16   [name], MD  insulin NPH-regular Human (NOVOLIN 70/30) (70-30) 100 UNIT/ML injection 20-30 units [place] before breakfast and dinner  Check glucose prior to usage Patient taking differently: Inject 32 Units into the skin 2 (two) times daily with a meal.  09/13/16   Schaevitz, Myra Rude, MD  ketorolac (TORADOL) 10 MG tablet Take 1 tablet (10 mg total) by mouth every 8 (eight) hours as needed. Patient not taking: Reported on 05/09/2017 05/12/15   [name], MD  lisinopril-hydrochlorothiazide (PRINZIDE,ZESTORETIC) 10-12.5 MG tablet Take 1 tablet by mouth daily.    [provider]  LORazepam (ATIVAN) 0.5 MG tablet Take 1 tablet (0.5 mg total) by mouth every 8 (eight) hours as needed for anxiety. Patient not taking: Reported on 05/09/2017 03/12/15   Darien Ramus, MD  meloxicam (MOBIC) 15 MG tablet Take 1 tablet (15 mg total) by mouth daily. Patient not taking: Reported on 05/09/2017 10/20/15   Kem Boroughs B, FNP  metFORMIN (GLUCOPHAGE) 500 MG tablet Take 500 mg by mouth every evening.     [provider]  Multiple Vitamin (MULTIVITAMIN WITH MINERALS) TABS tablet Take 1 tablet by mouth daily.    [provider]  ondansetron (ZOFRAN) 4 MG tablet Take 1 tablet (4 mg total) by mouth every 8 (eight) hours as needed for nausea or vomiting. Patient not taking: Reported on 05/09/2017 10/27/15   Phineas Semen, MD  ondansetron [facility]) 4 MG tablet Take 1 tablet (4 mg total) by mouth daily as needed for nausea or vomiting. 06/24/17 06/24/18  Willy Eddy, MD  oxyCODONE-acetaminophen (PERCOCET) 7.5-325 MG tablet Take 1 tablet by mouth every 6 (six) hours as needed for severe pain. Patient not taking: Reported on 05/09/2017 12/10/16   Joni Reining, PA-C  promethazine (PHENERGAN) 12.5 MG tablet Take 1 tablet (12.5 mg total) by mouth every 6 (six) hours as needed for nausea or vomiting. Patient not taking: Reported on 05/09/2017 02/15/16   Willy Eddy, MD  vitamin E 400 UNIT capsule Take 400 Units by mouth daily.    [provider]   Allergies  Allergen Reactions  . Ultram [Tramadol Hcl] Palpitations    VITAL SIGNS: Temp:  [98.2 F (36.8 C)-100.2 F (37.9 C)] 98.2 F (36.8 C) (07/18 0400) Pulse Rate:  [69-113] 71 (07/18  0700) Resp:  [13-36] 27 (07/18 0700) BP: (137-170)/(62-90) 165/78 (07/18 0700) SpO2:  [96 %-100 %] 99 % (07/18 0700)  PHYSICAL EXAMINATION: General: acutely ill appearing female resting in bed  Neuro: lethargic and confused, follows commands, bilateral pupils dilated 4 mm and reactive  HEENT: supple, mild JVD  Cardiovascular: sinus tach, no R/G Lungs: clear throughout, tachypneic  Abdomen: +BS x4, obese, soft, non tender, non distended  Musculoskeletal: normal bulk and tone, no edema  Skin: intact no rashes or lesions   Recent Labs  Lab 01/24/18 1756 01/25/18 0052 01/25/18 0442  NA 150* 150* 148*  K 3.2* 2.7* 2.7*  CL 123* 121* 118*  CO2 18* 20* 21*  BUN 50* 43* 41*  CREATININE 0.80 0.66 0.68  GLUCOSE 357* 247* 296*   Recent Labs  Lab 01/23/18 2217 01/24/18 0303 01/25/18 0442  HGB 16.5* 15.3 14.1  HCT 53.2* 45.6 41.6  WBC 24.7* 21.6* 17.2*  PLT 303 172 143*   Ct Head  Wo Contrast  Result Date: 01/24/2018 CLINICAL DATA:  52 y/o F; episode of unresponsiveness and hyperglycemia. EXAM: CT HEAD WITHOUT CONTRAST TECHNIQUE: Contiguous axial images were obtained from the base of the skull through the vertex without intravenous contrast. COMPARISON:  11/08/2017 CT head. FINDINGS: Brain: No evidence of acute infarction, hemorrhage, hydrocephalus, extra-axial collection or mass lesion/mass effect. Stable mild chronic microvascular ischemic changes and volume loss of the brain. Vascular: Calcific atherosclerosis of carotid siphons. No hyperdense vessel identified. Skull: Normal. Negative for fracture or focal lesion. Sinuses/Orbits: No acute finding. Other: None. IMPRESSION: 1. No acute intracranial abnormality identified. 2. Stable mild chronic microvascular ischemic changes and volume loss of the brain. Electronically Signed   By: Mitzi Hansen M.D.   On: 01/24/2018 05:42   Dg Chest Port 1 View  Result Date: 01/24/2018 CLINICAL DATA:  52 y/o F; arrived unresponsive,  nonresponsive, history of drug abuse. EXAM: PORTABLE CHEST 1 VIEW COMPARISON:  01/20/2018 chest radiograph. FINDINGS: Normal cardiac silhouette. Aortic atherosclerosis with calcification. Clear lungs. No pleural effusion or pneumothorax. No acute osseous abnormality is evident. Right upper quadrant cholecystectomy surgical clips. IMPRESSION: No active disease. Electronically Signed   By: Mitzi Hansen M.D.   On: 01/24/2018 02:29    ASSESSMENT / PLAN: Assessment: DKA improved anion gap closed Hypernatremia- most likely due to fluids change to 1/2 NS Acidosis in now due to hyperchloremia and anion gap is closed will change D5W or 1/2 NS-- Improvement noted  RA on multiple narcotics at home-- None identified with any prescription -- as per psych avoid HTN will resume home meds-- increase lisinopril and HCTZ in am Everyday smoker nicotine patch  Plan: Increase insulin 70/30 to 30 bid -- SSI -- K phos replacement -- Increase lisinopril to 20 and HCTZ to 25 AMS better but still little drowsy and lack of participation-- Psych eval appreciated   NO fever but WBC very elevated again could be due to DKA will get culture nad monitor Renal function improving continue iv fluids-- decrease 1/2 NS to 50 GI DVT prophylaxis< PLT dropped by 50% hold heparin and monitor>   Skin/Wound: chronic changes   Electrolytes: Replace electrolytes per ICU electrolyte replacement protocol.   IVF: none  Nutrition: diabetic diet  Prophylaxis: DVT Prophylaxis with Heparin,. GI Prophylaxis.   Restraints: None  PT/OT eval and treat. OOB when appropriate.   Lines/Tubes:  No  foley  No  central line.  ADVANCE DIRECTIVE:Full code  FAMILY DISCUSSION:Spoke with patient   Quality Care: PPI, DVT prophylaxis, HOB elevated, Infection control all reviewed and addressed.  Events and notes from last 24 hours reviewed. Care plan discussed on multidisciplinary rounds  CC TIME:32 min   Overall doing well- ok  to transfer out of ICU   Old records reviewed discussed results and management plan with patient  Images personally reviewed and results and labs reviewed and discussed with patient.  All medication reviewed and adjusted  Further management depending on test results and work up as outlined above.   Roseanne Reno, M.D

## 2018-01-26 LAB — BASIC METABOLIC PANEL
ANION GAP: 7 (ref 5–15)
BUN: 21 mg/dL — ABNORMAL HIGH (ref 6–20)
CALCIUM: 8.2 mg/dL — AB (ref 8.9–10.3)
CO2: 29 mmol/L (ref 22–32)
Chloride: 105 mmol/L (ref 98–111)
Creatinine, Ser: 0.42 mg/dL — ABNORMAL LOW (ref 0.44–1.00)
GFR calc non Af Amer: >60 mL/min (ref >60)
Glucose, Bld: 263 mg/dL — ABNORMAL HIGH (ref 70–99)
Potassium: 3.1 mmol/L — ABNORMAL LOW (ref 3.5–5.1)
Sodium: 141 mmol/L (ref 135–145)

## 2018-01-26 LAB — CBC
HCT: 38 % (ref 35.0–47.0)
Hemoglobin: 13.3 g/dL (ref 12.0–16.0)
MCH: 29.5 pg (ref 26.0–34.0)
MCHC: 35 g/dL (ref 32.0–36.0)
MCV: 84.3 fL (ref 80.0–100.0)
PLATELETS: 114 10*3/uL — AB (ref 150–440)
RBC: 4.51 MIL/uL (ref 3.80–5.20)
RDW: 12.7 % (ref 11.5–14.5)
WBC: 12.8 10*3/uL — AB (ref 3.6–11.0)

## 2018-01-26 LAB — GLUCOSE, CAPILLARY
GLUCOSE-CAPILLARY: 169 mg/dL — AB (ref 70–99)
GLUCOSE-CAPILLARY: 229 mg/dL — AB (ref 70–99)
GLUCOSE-CAPILLARY: 95 mg/dL (ref 70–99)
Glucose-Capillary: 277 mg/dL — ABNORMAL HIGH (ref 70–99)
Glucose-Capillary: 275 mg/dL — ABNORMAL HIGH (ref 70–99)
Glucose-Capillary: 149 mg/dL — ABNORMAL HIGH (ref 70–99)
Glucose-Capillary: 94 mg/dL (ref 70–99)

## 2018-01-26 LAB — MAGNESIUM: MAGNESIUM: 2.1 mg/dL (ref 1.7–2.4)

## 2018-01-26 LAB — PHOSPHORUS: PHOSPHORUS: 3.3 mg/dL (ref 2.5–4.6)

## 2018-01-26 MED ORDER — POTASSIUM CHLORIDE 20 MEQ PO PACK
40.0000 meq | PACK | Freq: Two times a day (BID) | ORAL | Status: AC
  Administered 2018-01-26: 40 meq via ORAL
  Filled 2018-01-26 (×3): qty 2

## 2018-01-26 MED ORDER — INSULIN GLARGINE 100 UNIT/ML ~~LOC~~ SOLN
10.0000 [IU] | Freq: Every day | SUBCUTANEOUS | Status: DC
  Administered 2018-01-27: 10 [IU] via SUBCUTANEOUS
  Filled 2018-01-26 (×2): qty 0.1

## 2018-01-26 MED ORDER — ADULT MULTIVITAMIN W/MINERALS CH
1.0000 | ORAL_TABLET | Freq: Every day | ORAL | Status: DC
  Administered 2018-01-26 – 2018-01-31 (×6): 1 via ORAL
  Filled 2018-01-26 (×6): qty 1

## 2018-01-26 MED ORDER — GABAPENTIN 300 MG PO CAPS
1200.0000 mg | ORAL_CAPSULE | Freq: Three times a day (TID) | ORAL | Status: DC
  Administered 2018-01-26 – 2018-01-31 (×15): 1200 mg via ORAL
  Filled 2018-01-26 (×2): qty 3
  Filled 2018-01-26: qty 4
  Filled 2018-01-26 (×2): qty 3
  Filled 2018-01-26 (×2): qty 4
  Filled 2018-01-26 (×4): qty 3
  Filled 2018-01-26: qty 4
  Filled 2018-01-26 (×3): qty 3

## 2018-01-26 MED ORDER — POTASSIUM CHLORIDE CRYS ER 20 MEQ PO TBCR
40.0000 meq | EXTENDED_RELEASE_TABLET | Freq: Two times a day (BID) | ORAL | Status: DC
  Filled 2018-01-26: qty 2

## 2018-01-26 MED ORDER — BUPRENORPHINE HCL-NALOXONE HCL 8-2 MG SL SUBL
1.0000 | SUBLINGUAL_TABLET | Freq: Two times a day (BID) | SUBLINGUAL | Status: AC
  Administered 2018-01-26 – 2018-01-28 (×4): 1 via SUBLINGUAL
  Filled 2018-01-26 (×4): qty 1

## 2018-01-26 MED ORDER — INSULIN GLARGINE 100 UNIT/ML ~~LOC~~ SOLN
55.0000 [IU] | Freq: Every day | SUBCUTANEOUS | Status: DC
  Filled 2018-01-26: qty 0.55

## 2018-01-26 MED ORDER — POTASSIUM CHLORIDE 20 MEQ PO PACK: 40.0000 meq | PACK | Freq: Two times a day (BID) | ORAL | Status: DC

## 2018-01-26 NOTE — Evaluation (Signed)
Occupational Therapy Evaluation Patient Details Name: Karen Lindsey MRN: [id number] DOB: 06/13/1966 Today's Date: 01/26/2018    History of Present Illness Pt is a 52 year old female who was admitted to [facility] for DKA following AMS.  PMH includes RA, narcotic addiction, DM, Htn, fatty liver and neuropathy.   Clinical Impression   Pt. presents with weakness, limited activity tolerance, and limited functional mobility which limits her ability to complete basic ADL and IADL functioning. Pt. currently resides in a Motel with friends.  Pt. was independent with ADLs, and IADL functioning: including meal preparation, and medication management. Pt. was able to drive.  Pt. education was provided about A/E use for LE ADLs, and energy conservation for ADLs. Pt. Could benefit from OT services for ADL training, A/E training, and pt. education about energy conservation, work simplification techniques, and DME. Pt. would benefit from SNF level of care upon discharge. Pt. could benefit from follow-up OT services at discharge.    Follow Up Recommendations  SNF    Equipment Recommendations       Recommendations for Other Services       Precautions / Restrictions Precautions Precautions: Fall Restrictions Weight Bearing Restrictions: No      Mobility Bed Mobility                  Transfers Overall transfer level: Needs assistance Equipment used: Rolling walker (2 wheeled) Transfers: Sit to/from Stand Sit to Stand: Min assist              Balance                                           ADL either performed or assessed with clinical judgement   ADL Overall ADL's : Needs assistance/impaired Eating/Feeding: Set up;Independent(Poor appetite)   Grooming: Set up;Independent   Upper Body Bathing: Independent   Lower Body Bathing: Moderate assistance   Upper Body Dressing : Set up;Independent   Lower Body Dressing: Set up;Moderate assistance                 General ADL Comments: Pt. education was provided about A/E use for LE ADLs.     Vision Baseline Vision/History: No visual deficits Patient Visual Report: No change from baseline       Perception     Praxis      Pertinent Vitals/Pain Pain Assessment: No/denies pain     Hand Dominance     Extremity/Trunk Assessment Upper Extremity Assessment Upper Extremity Assessment: Generalized weakness           Communication     Cognition Arousal/Alertness: Awake/alert Behavior During Therapy: Flat affect Overall Cognitive Status: Within Functional Limits for tasks assessed                                     General Comments       Exercises     Shoulder Instructions      Home Living Family/patient expects to be discharged to:: Skilled nursing facility Living Arrangements: Non-relatives/Friends   Type of Home: Other(Comment)(Motel) Home Access: Level entry     Home Layout: One level     Bathroom Shower/Tub: Tub/shower unit         Home Equipment: Environmental consultant - 2 wheels  Prior Functioning/Environment Level of Independence: Independent        Comments: Pt. reports that she was able to drive.        OT Problem List: Decreased strength;Decreased range of motion;Decreased knowledge of use of DME or AE;Impaired balance (sitting and/or standing)      OT Treatment/Interventions: Self-care/ADL training;Therapeutic exercise;Energy conservation;DME and/or AE instruction;Patient/family education    OT Goals(Current goals can be found in the care plan section) Acute Rehab OT Goals Patient Stated Goal: To regain independence, and get stronger. OT Goal Formulation: With patient Potential to Achieve Goals: Good  OT Frequency: Min 1X/week   Barriers to D/C:            Co-evaluation              AM-PAC PT "6 Clicks" Daily Activity     Outcome Measure Help from another person eating meals?: None Help from another person taking  care of personal grooming?: None Help from another person toileting, which includes using toliet, bedpan, or urinal?: A Little Help from another person bathing (including washing, rinsing, drying)?: A Lot Help from another person to put on and taking off regular upper body clothing?: None Help from another person to put on and taking off regular lower body clothing?: A Lot 6 Click Score: 19   End of Session    Activity Tolerance: Patient limited by fatigue Patient left: in bed  OT Visit Diagnosis: Muscle weakness (generalized) (M62.81)                Time: 7628-3151 OT Time Calculation (min): 20 min Charges:  OT General Charges $OT Visit: 1 Visit OT Evaluation $OT Eval Moderate Complexity: 1 Mod G-Codes:     Olegario Messier, MS, OTR/L  Olegario Messier 01/26/2018, 12:19 PM

## 2018-01-26 NOTE — Consult Note (Signed)
[facility] Consult   Reason for Consult: Follow-up consult for this patient came into the hospital with altered mental status Referring Physician: Bridgett Larsson Patient Identification: Karen Lindsey MRN:  [id number] Principal Diagnosis: Acute delirium Diagnosis:   Patient Active Problem List   Diagnosis Date Noted  . Narcotic abuse ([place]) [F11.10] 01/24/2018  . Acute delirium [R41.0] 01/24/2018  . DKA (diabetic ketoacidoses) ([place]) [E13.10] 01/23/2018  . AKI (acute kidney injury) ([place]) [N17.9] 01/23/2018  . HTN (hypertension) [I10] 01/23/2018  . Chest pain [R07.9] 11/08/2017    Total Time spent with patient: 30 minutes  Subjective:   Karen Lindsey is a 52 y.o. female patient admitted with "I just cannot eat".  HPI: Patient seen chart reviewed patient was much more lucid today.  She was able to engage in a reasonable conversation.  She says she is still feeling very sick with lots of back pain and general myalgias.  She does not feel hungry and has not been able to eat anything today although the diarrhea has stopped.  Patient talked about her chronic low-grade depression and her recent decision to stop taking methadone.  No evidence of suicidality or psychosis.  Past Psychiatric History: History of chronic depression history of substance abuse which had recently been under pretty good control with medication  Risk to Self:   Risk to Others:   Prior Inpatient Therapy:   Prior Outpatient Therapy:    Past Medical History:  Past Medical History:  Diagnosis Date  . Arthritis    rheumatoid  . Chronic pain    a. upper/mid back.  . Diabetic peripheral neuropathy ([place])   . Fatty liver   . Hypertension   . Insulin dependent diabetes mellitus ([place])    a. Dx ~ 2011.  . Narcotic addiction ([place])    a. Followed in substance abuse center in [place] - on chronic methadone.  . Neuropathy   . RA (rheumatoid arthritis) ([place])   . Tobacco abuse     Past Surgical History:  Procedure  Laterality Date  . ABDOMINAL HYSTERECTOMY    . CESAREAN SECTION    . CHOLECYSTECTOMY    . KNEE SURGERY    . MANDIBLE FRACTURE SURGERY     Family History: No family history on file. Family Psychiatric  History: See previous note Social History:  Social History   Substance and Sexual Activity  Alcohol Use No   Comment: Hasn't had a drink since 1998     Social History   Substance and Sexual Activity  Drug Use Yes   Comment: Previously abused pain pills. prescribed methadone     Social History   Socioeconomic History  . Marital status: Legally Separated    Spouse name: Not on file  . Number of children: Not on file  . Years of education: Not on file  . Highest education level: Not on file  Occupational History    Comment: Works part-time in [employer], [employer]  Social Needs  . Financial resource strain: Not on file  . Food insecurity:    Worry: Not on file    Inability: Not on file  . Transportation needs:    Medical: Not on file    Non-medical: Not on file  Tobacco Use  . Smoking status: Current Every Day Smoker    Packs/day: 0.50    Types: Cigarettes  . Smokeless tobacco: Never Used  . Tobacco comment: 30+ years  Substance and Sexual Activity  . Alcohol use: No  Comment: Hasn't had a drink since 1998  . Drug use: Yes    Comment: Previously abused pain pills. prescribed methadone   . Sexual activity: Not on file  Lifestyle  . Physical activity:    Days per week: Not on file    Minutes per session: Not on file  . Stress: Not on file  Relationships  . Social connections:    Talks on phone: Not on file    Gets together: Not on file    Attends religious service: Not on file    Active member of club or organization: Not on file    Attends meetings of clubs or organizations: Not on file    Relationship status: Not on file  Other Topics Concern  . Not on file  Social History Narrative   Lives locally with friends/roomates.   Additional Social History:     Allergies:   Allergies  Allergen Reactions  . Ultram [Tramadol Hcl] Palpitations    Labs:  Results for orders placed or performed during the hospital encounter of 01/23/18 (from the past 48 hour(s))  Basic metabolic panel     Status: Abnormal   Collection Time: 01/24/18  5:56 PM  Result Value Ref Range   Sodium 150 (H) 135 - 145 mmol/L   Potassium 3.2 (L) 3.5 - 5.1 mmol/L   Chloride 123 (H) 98 - 111 mmol/L    Comment: Please note change in reference range.   CO2 18 (L) 22 - 32 mmol/L   Glucose, Bld 357 (H) 70 - 99 mg/dL    Comment: Please note change in reference range.   BUN 50 (H) 6 - 20 mg/dL    Comment: Please note change in reference range.   Creatinine, Ser 0.80 0.44 - 1.00 mg/dL   Calcium 9.0 8.9 - 10.3 mg/dL   GFR calc non Af Amer >60 >60 mL/min   GFR calc Af Amer >60 >60 mL/min    Comment: (NOTE) The eGFR has been calculated using the CKD EPI equation. This calculation has not been validated in all clinical situations. eGFR's persistently <60 mL/min signify possible Chronic Kidney Disease.    Anion gap 9 5 - 15    Comment: Performed at [facility], [address] [place], [place] [address]  Glucose, capillary     Status: Abnormal   Collection Time: 01/24/18  7:44 PM  Result Value Ref Range   Glucose-Capillary 265 (H) 70 - 99 mg/dL  Glucose, capillary     Status: Abnormal   Collection Time: 01/25/18 12:03 AM  Result Value Ref Range   Glucose-Capillary 214 (H) 70 - 99 mg/dL  Basic metabolic panel     Status: Abnormal   Collection Time: 01/25/18 12:52 AM  Result Value Ref Range   Sodium 150 (H) 135 - 145 mmol/L   Potassium 2.7 (LL) 3.5 - 5.1 mmol/L    Comment: CRITICAL RESULT CALLED TO, READ BACK BY AND VERIFIED WITH TONY WALKER @0126  01/25/18 [facility]    Chloride 121 (H) 98 - 111 mmol/L    Comment: Please note change in reference range.   CO2 20 (L) 22 - 32 mmol/L   Glucose, Bld 247 (H) 70 - 99 mg/dL    Comment: Please note change in reference  range.   BUN 43 (H) 6 - 20 mg/dL    Comment: Please note change in reference range.   Creatinine, Ser 0.66 0.44 - 1.00 mg/dL   Calcium 9.0 8.9 - 10.3 mg/dL   GFR calc  non Af Amer >60 >60 mL/min   GFR calc Af Amer >60 >60 mL/min    Comment: (NOTE) The eGFR has been calculated using the CKD EPI equation. This calculation has not been validated in all clinical situations. eGFR's persistently <60 mL/min signify possible Chronic Kidney Disease.    Anion gap 9 5 - 15    Comment: Performed at [facility], [address] [place], [place] [address]  Magnesium     Status: None   Collection Time: 01/25/18 12:52 AM  Result Value Ref Range   Magnesium 2.2 1.7 - 2.4 mg/dL    Comment: Performed at [facility], [address] [place], [place] [address]  Glucose, capillary     Status: Abnormal   Collection Time: 01/25/18  4:24 AM  Result Value Ref Range   Glucose-Capillary 288 (H) 70 - 99 mg/dL  Basic metabolic panel     Status: Abnormal   Collection Time: 01/25/18  4:42 AM  Result Value Ref Range   Sodium 148 (H) 135 - 145 mmol/L   Potassium 2.7 (LL) 3.5 - 5.1 mmol/L    Comment: CRITICAL RESULT CALLED TO, READ BACK BY AND VERIFIED WITH TONY WALKER @0550  01/25/18 FLC    Chloride 118 (H) 98 - 111 mmol/L    Comment: Please note change in reference range.   CO2 21 (L) 22 - 32 mmol/L   Glucose, Bld 296 (H) 70 - 99 mg/dL    Comment: Please note change in reference range.   BUN 41 (H) 6 - 20 mg/dL    Comment: Please note change in reference range.   Creatinine, Ser 0.68 0.44 - 1.00 mg/dL   Calcium 8.7 (L) 8.9 - 10.3 mg/dL   GFR calc non Af Amer >60 >60 mL/min   GFR calc Af Amer >60 >60 mL/min    Comment: (NOTE) The eGFR has been calculated using the CKD EPI equation. This calculation has not been validated in all clinical situations. eGFR's persistently <60 mL/min signify possible Chronic Kidney Disease.    Anion gap 9 5 - 15    Comment: Performed at [facility], [address] [place], [place] [address]  Brain natriuretic peptide     Status: None   Collection Time: 01/25/18  4:42 AM  Result Value Ref Range   B Natriuretic Peptide 62.0 0.0 - 100.0 pg/mL    Comment: Performed at [facility], [address] [place],  [address]  CBC with Differential/Platelet     Status: Abnormal   Collection Time: 01/25/18  4:42 AM  Result Value Ref Range   WBC 17.2 (H) 3.6 - 11.0 K/uL   RBC 4.88 3.80 - 5.20 MIL/uL   Hemoglobin 14.1 12.0 - 16.0 g/dL   HCT 41.6 35.0 - 47.0 %   MCV 85.2 80.0 - 100.0 fL   MCH 28.9 26.0 - 34.0 pg   MCHC 33.9 32.0 - 36.0 g/dL   RDW 13.1 11.5 - 14.5 %   Platelets 143 (L) 150 - 440 K/uL   Neutrophils Relative % 76 %   Neutro Abs 13.1 (H) 1.4 - 6.5 K/uL   Lymphocytes Relative 16 %   Lymphs Abs 2.8 1.0 - 3.6 K/uL   Monocytes Relative 7 %   Monocytes Absolute 1.1 (H) 0.2 - 0.9 K/uL   Eosinophils Relative 0 %   Eosinophils Absolute 0.0 0 - 0.7 K/uL   Basophils Relative 1 %   Basophils Absolute 0.1 0 - 0.1 K/uL  Comment: Performed at [facility], [address] [place], [place] [address]  Phosphorus     Status: Abnormal   Collection Time: 01/25/18  4:42 AM  Result Value Ref Range   Phosphorus 1.9 (L) 2.5 - 4.6 mg/dL    Comment: Performed at [facility], [address] [place], [place] [address]  Glucose, capillary     Status: Abnormal   Collection Time: 01/25/18  7:25 AM  Result Value Ref Range   Glucose-Capillary 258 (H) 70 - 99 mg/dL   Comment 1 Notify RN   Glucose, capillary     Status: Abnormal   Collection Time: 01/25/18 11:31 AM  Result Value Ref Range   Glucose-Capillary 282 (H) 70 - 99 mg/dL  Glucose, capillary     Status: Abnormal   Collection Time: 01/25/18 12:49 PM  Result Value Ref Range   Glucose-Capillary 303 (H) 70 - 99 mg/dL  C difficile quick scan w PCR reflex     Status: None   Collection Time: 01/25/18  2:49 PM  Result Value Ref Range   C Diff  antigen NEGATIVE NEGATIVE   C Diff toxin NEGATIVE NEGATIVE   C Diff interpretation No C. difficile detected.     Comment: Performed at [facility], [address] [place], [place] [address]  Glucose, capillary     Status: Abnormal   Collection Time: 01/25/18  4:42 PM  Result Value Ref Range   Glucose-Capillary 216 (H) 70 - 99 mg/dL  Potassium     Status: Abnormal   Collection Time: 01/25/18  6:31 PM  Result Value Ref Range   Potassium 3.3 (L) 3.5 - 5.1 mmol/L    Comment: HEMOLYSIS AT THIS LEVEL MAY AFFECT RESULT Performed at [facility], [address] [place], [place] [address]   Phosphorus     Status: Abnormal   Collection Time: 01/25/18  6:31 PM  Result Value Ref Range   Phosphorus 1.9 (L) 2.5 - 4.6 mg/dL    Comment: Performed at [facility], [address] [place], [place] [address]  Glucose, capillary     Status: Abnormal   Collection Time: 01/25/18  8:38 PM  Result Value Ref Range   Glucose-Capillary 169 (H) 70 - 99 mg/dL   Comment 1 Notify RN    Comment 2 Document in Chart   Glucose, capillary     Status: Abnormal   Collection Time: 01/25/18  9:12 PM  Result Value Ref Range   Glucose-Capillary 197 (H) 70 - 99 mg/dL  Glucose, capillary     Status: Abnormal   Collection Time: 01/26/18  3:51 AM  Result Value Ref Range   Glucose-Capillary 277 (H) 70 - 99 mg/dL  Basic metabolic panel     Status: Abnormal   Collection Time: 01/26/18  5:17 AM  Result Value Ref Range   Sodium 141 135 - 145 mmol/L   Potassium 3.1 (L) 3.5 - 5.1 mmol/L   Chloride 105 98 - 111 mmol/L    Comment: Please note change in reference range.   CO2 29 22 - 32 mmol/L   Glucose, Bld 263 (H) 70 - 99 mg/dL    Comment: Please note change in reference range.   BUN 21 (H) 6 - 20 mg/dL    Comment: Please note change in reference range.   Creatinine, Ser 0.42 (L) 0.44 - 1.00 mg/dL   Calcium 8.2 (L) 8.9 - 10.3 mg/dL   GFR calc non Af Amer >60 >60 mL/min  GFR calc Af Amer  >60 >60 mL/min    Comment: (NOTE) The eGFR has been calculated using the CKD EPI equation. This calculation has not been validated in all clinical situations. eGFR's persistently <60 mL/min signify possible Chronic Kidney Disease.    Anion gap 7 5 - 15    Comment: Performed at [facility], [address] [place], [place] [address]  Magnesium     Status: None   Collection Time: 01/26/18  5:17 AM  Result Value Ref Range   Magnesium 2.1 1.7 - 2.4 mg/dL    Comment: Performed at [facility], [address] [place], [place] [address]  Phosphorus     Status: None   Collection Time: 01/26/18  5:17 AM  Result Value Ref Range   Phosphorus 3.3 2.5 - 4.6 mg/dL    Comment: Performed at [facility], [address] [place], [place] [address]  CBC     Status: Abnormal   Collection Time: 01/26/18  5:17 AM  Result Value Ref Range   WBC 12.8 (H) 3.6 - 11.0 K/uL   RBC 4.51 3.80 - 5.20 MIL/uL   Hemoglobin 13.3 12.0 - 16.0 g/dL   HCT 38.0 35.0 - 47.0 %   MCV 84.3 80.0 - 100.0 fL   MCH 29.5 26.0 - 34.0 pg   MCHC 35.0 32.0 - 36.0 g/dL   RDW 12.7 11.5 - 14.5 %   Platelets 114 (L) 150 - 440 K/uL    Comment: Performed at [facility], [address] [place], [place] [address]  Glucose, capillary     Status: Abnormal   Collection Time: 01/26/18  8:27 AM  Result Value Ref Range   Glucose-Capillary 229 (H) 70 - 99 mg/dL   Comment 1 Notify RN    Comment 2 Document in Chart   Glucose, capillary     Status: Abnormal   Collection Time: 01/26/18 11:10 AM  Result Value Ref Range   Glucose-Capillary 275 (H) 70 - 99 mg/dL   Comment 1 Notify RN    Comment 2 Document in Chart     Current Facility-Administered Medications  Medication Dose Route Frequency Provider Last Rate Last Dose  . acetaminophen (TYLENOL) tablet 650 mg  650 mg Oral Q6H PRN Awilda Bill, NP   650 mg at 01/24/18 2033  . amLODipine (NORVASC) tablet 10 mg  10 mg Oral Daily Demetrios Loll, MD    10 mg at 01/26/18 0914  . buprenorphine-naloxone (SUBOXONE) 2-0.5 mg per SL tablet 1 tablet  1 tablet Sublingual BID Clapacs, John T, MD      . famotidine (PEPCID) tablet 20 mg  20 mg Oral BID Lahoma Rocker, MD   20 mg at 01/26/18 0914  . gabapentin (NEURONTIN) capsule 1,200 mg  1,200 mg Oral TID Clapacs, John T, MD   1,200 mg at 01/26/18 1605  . hydrALAZINE (APRESOLINE) injection 10-20 mg  10-20 mg Intravenous Q4H PRN Awilda Bill, NP   20 mg at 01/24/18 2359  . insulin aspart (novoLOG) injection 0-20 Units  0-20 Units Subcutaneous TID WC Demetrios Loll, MD   11 Units at 01/26/18 1124  . insulin aspart (novoLOG) injection 0-5 Units  0-5 Units Subcutaneous QHS Demetrios Loll, MD      . insulin glargine (LANTUS) injection 55 Units  55 Units Subcutaneous QHS Demetrios Loll, MD      . lisinopril (PRINIVIL,ZESTRIL) tablet 20 mg  20 mg Oral Daily Lahoma Rocker, MD   20 mg at 01/26/18  0915  . loperamide (IMODIUM) capsule 4 mg  4 mg Oral PRN Clapacs, Madie [place], MD   4 mg at 01/26/18 0842  . multivitamin with minerals tablet 1 tablet  1 tablet Oral Daily Demetrios Loll, MD   1 tablet at 01/26/18 1301  . nicotine (NICODERM CQ - dosed in mg/24 hours) patch 14 mg  14 mg Transdermal Daily Lahoma Rocker, MD   14 mg at 01/26/18 0916  . potassium chloride (KLOR-CON) packet 40 mEq  40 mEq Oral BID Demetrios Loll, MD   40 mEq at 01/26/18 1342  . protein supplement (PREMIER PROTEIN) liquid - approved for s/p bariatric surgery  11 oz Oral BID BM Lahoma Rocker, MD   11 oz at 01/26/18 1301  . zolpidem (AMBIEN) tablet 5 mg  5 mg Oral QHS PRN Henreitta Leber, MD   5 mg at 01/25/18 2311    Musculoskeletal: Strength & Muscle Tone: decreased Gait & Station: unable to stand Patient leans: N/A  Psychiatric Specialty Exam: Physical Exam  Nursing note and vitals reviewed. Constitutional: She appears well-developed and well-nourished.  HENT:  Head: Normocephalic and atraumatic.  Eyes: Pupils are equal, round, and reactive to light. Conjunctivae  are normal.  Neck: Normal range of motion.  Cardiovascular: Regular rhythm and normal heart sounds.  Respiratory: Effort normal. No respiratory distress.  GI: Soft.  Musculoskeletal: Normal range of motion.  Neurological: She is alert.  Skin: Skin is warm and dry.  Psychiatric: Her speech is normal. Judgment normal. She is slowed. Thought content is not paranoid. Cognition and memory are normal. She expresses no homicidal and no suicidal ideation.    Review of Systems  Constitutional: Negative.   HENT: Negative.   Eyes: Negative.   Respiratory: Negative.   Cardiovascular: Negative.   Gastrointestinal: Negative.   Musculoskeletal: Positive for myalgias.  Skin: Negative.   Neurological: Negative.   Psychiatric/Behavioral: Positive for depression. Negative for hallucinations, substance abuse and suicidal ideas. The patient is nervous/anxious. The patient does not have insomnia.     Blood pressure 139/64, pulse 73, temperature 97.8 F (36.6 C), temperature source Oral, resp. rate 18, height 5' 1"  (1.549 m), weight 60.9 kg (134 lb 3.2 oz), SpO2 98 %.Body mass index is 25.36 kg/m.  General Appearance: Casual  Eye Contact:  Good  Speech:  Slow  Volume:  Decreased  Mood:  Dysphoric  Affect:  Constricted  Thought Process:  Goal Directed  Orientation:  Full (Time, Place, and Person)  Thought Content:  Logical  Suicidal Thoughts:  No  Homicidal Thoughts:  No  Memory:  Immediate;   Fair Recent;   Fair Remote;   Fair  Judgement:  Fair  Insight:  Fair  Psychomotor Activity:  Decreased  Concentration:  Concentration: Fair  Recall:  [employer] of Knowledge:  Fair  Language:  Fair  Akathisia:  No  Handed:  Right  AIMS (if indicated):     Assets:  Desire for Improvement Housing Resilience  ADL's:  Impaired  Cognition:  WNL  Sleep:        Treatment Plan Summary: Daily contact with patient to assess and evaluate symptoms and progress in treatment, Medication management and Plan  Patient seen.  She is returning towards her baseline.  We talked about her decision to discontinue methadone.  Patient feels that she can simply no longer afford it.  We reviewed how she is likely to have a worsening of her pain problem if she follows through with this.  She has  no resources that would allow her to get into a Suboxone treatment.  For now at least it is reasonable to continue for another 2 days of inpatient Suboxone treatment to try to ease her discomfort.  At her request I reviewed her chart and will restart her gabapentin 1200 mg 3 times a day that she was taking before she came to the hospital.  Supportive counseling and therapy.  We will continue to follow as needed.  Disposition: No evidence of imminent risk to self or others at present.   Patient does not meet criteria for psychiatric inpatient admission. Supportive therapy provided about ongoing stressors.  Alethia Berthold, MD 01/26/2018 4:15 PM

## 2018-01-26 NOTE — Progress Notes (Signed)
PT Cancellation Note  Patient Details Name: Karen Lindsey MRN: [id number] DOB: [birth year]   Cancelled Treatment:    Reason Eval/Treat Not Completed: Other (comment).  Pt reporting that she is "wore out" from getting up to [facility] earlier with nursing assist and pt declining physical therapy at this time (pt's nurse notified).  Will re-attempt PT treatment session at a later date/time.  Hendricks Limes, PT 01/26/18, 4:14 PM [phone]

## 2018-01-26 NOTE — Progress Notes (Signed)
Initial Nutrition Assessment  DOCUMENTATION CODES:   Not applicable  INTERVENTION:   Premier Protein BID, each supplement provides 160 kcal and 30 grams of protein.   MVI daily  NUTRITION DIAGNOSIS:   Inadequate oral intake related to acute illness as evidenced by per patient/family report.  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 Pt reports poor appetite and oral intake for the past week. Pt ate <25% of her breakfast this morning but did drink some of a Premier Protein. Per chart, pt is weight stable. RD will order MVI to help pt meet her micronutrient needs. Continue Premier Protein. Pt likely at refeeding risk; monitor labs. Pt provided with diabetes education handouts today. Pt has been previously educated and reports she does not have any questions today.   Medications reviewed and include: pepcid, insulin, nicotine, KCl  Labs reviewed: K 3.1(L), BUN 21(H), creat 0.42(L), Ca 8.2(L), P 3.3 wnl, Mg 2.1 wnl Wbc- 12.8(H) cbgs- 277, 229, 275 x 24 hrs AIC 11.4(H)- 5/1  NUTRITION - FOCUSED PHYSICAL EXAM:    Most Recent Value  Orbital Region  No depletion  Upper Arm Region  Mild depletion  Thoracic and Lumbar Region  No depletion  Buccal Region  No depletion  Temple Region  No depletion  Clavicle Bone Region  No depletion  Clavicle and Acromion Bone Region  No depletion  Scapular Bone Region  No depletion  Dorsal Hand  Mild depletion  Patellar Region  Mild depletion  Anterior Thigh Region  Mild depletion  Posterior Calf Region  Mild depletion  Edema (RD Assessment)  Mild  Hair  Reviewed  Eyes  Reviewed  Mouth  Reviewed  Skin  Reviewed  Nails  Reviewed     Diet Order:    Diet Order           Diet Carb Modified Fluid consistency: Thin; Room service appropriate? Yes  Diet effective now          EDUCATION NEEDS:   Education needs have been addressed  Skin:  Skin Assessment: Reviewed RN Assessment  Last BM:  7/18- type 7  Height:   Ht Readings from Last 1 Encounters:  01/23/18 5' 1"  (1.549 m)    Weight:   Wt Readings from Last 1 Encounters:  01/26/18 134 lb 3.2 oz (60.9 kg)    Ideal Body Weight:  47.7 kg  BMI:  Body mass index is 25.36 kg/m.  Estimated Nutritional Needs:   Kcal:  1400-1600kcal/day   Protein:  61-73g/day   Fluid:  >1.4L/day   Koleen Distance MS, RD, LDN Pager #- [phone] Office#- [phone] After Hours Pager: [phone]

## 2018-01-26 NOTE — Progress Notes (Signed)
Pharmacy Electrolyte Monitoring Consult:  Pharmacy consulted to assist in monitoring and replacing electrolytes in this 52 y.o. female admitted on 01/23/2018 with DKA.    Labs:  Sodium (mmol/L)  Date Value  01/26/2018 141  03/29/2014 142   Potassium (mmol/L)  Date Value  01/26/2018 3.1 (L)  03/29/2014 3.4 (L)   Magnesium (mg/dL)  Date Value  24/03/7352 2.1  03/29/2014 1.6 (L)   Phosphorus (mg/dL)  Date Value  29/92/4268 3.3   Calcium (mg/dL)  Date Value  34/19/6222 8.2 (L)   Calcium, Total (mg/dL)  Date Value  97/98/9211 8.5   Albumin (g/dL)  Date Value  94/17/4081 3.8  03/29/2014 3.2 (L)    Assessment/Plan:   K= 3.1 this AM. Mg and Phos WNL. Pt refused KCL tab last night. Will attempt again. Will order KCL x 2 doses. Labs in AM.   Pharmacy will continue to monitor and adjust per consult.   Olene Floss, Pharm.D, BCPS Clinical Pharmacist 01/26/2018 7:30 AM

## 2018-01-26 NOTE — Progress Notes (Signed)
[employer] [facility]   PATIENT NAME: Karen Lindsey    MR#:  [id number]  DATE OF BIRTH:  06/23/1966  SUBJECTIVE:  CHIEF COMPLAINT:   Chief Complaint  Patient presents with  . Altered Mental Status   Patient has nausea and one episode diarrhea.  She wants to try to eat more. REVIEW OF SYSTEMS:  Review of Systems  Constitutional: Positive for malaise/fatigue. Negative for chills and fever.  HENT: Negative for sore throat.   Eyes: Negative for blurred vision and double vision.  Respiratory: Negative for cough, hemoptysis, shortness of breath, wheezing and stridor.   Cardiovascular: Negative for chest pain, palpitations, orthopnea and leg swelling.  Gastrointestinal: Positive for diarrhea and nausea. Negative for abdominal pain, blood in stool, constipation, melena and vomiting.  Genitourinary: Negative for dysuria, flank pain and hematuria.  Musculoskeletal: Negative for back pain and joint pain.  Neurological: Negative for dizziness, sensory change, focal weakness, seizures, loss of consciousness, weakness and headaches.  Endo/Heme/Allergies: Negative for polydipsia.  Psychiatric/Behavioral: Negative for depression. The patient is not nervous/anxious.     DRUG ALLERGIES:   Allergies  Allergen Reactions  . Ultram [Tramadol Hcl] Palpitations   VITALS:  Blood pressure 139/64, pulse 73, temperature 97.8 F (36.6 C), temperature source Oral, resp. rate 18, height 5\' 1"  (1.549 m), weight 134 lb 3.2 oz (60.9 kg), SpO2 98 %. PHYSICAL EXAMINATION:  Physical Exam  Constitutional: She is oriented to person, place, and time. She appears well-developed. No distress.  HENT:  Head: Normocephalic.  Eyes: Pupils are equal, round, and reactive to light. Conjunctivae are normal. No scleral icterus.  Neck: Neck supple. No JVD present. No tracheal deviation present.  Cardiovascular: Normal rate, regular rhythm and normal heart sounds. Exam reveals no gallop.    No murmur heard. Pulmonary/Chest: Effort normal and breath sounds normal. No respiratory distress. She has no wheezes. She has no rales.  Abdominal: Soft. Bowel sounds are normal. She exhibits no distension. There is tenderness. There is no rebound.  Musculoskeletal: She exhibits no edema or tenderness.  Neurological: She is alert and oriented to person, place, and time. No cranial nerve deficit.  Skin: No rash noted. No erythema.  Psychiatric: She has a normal mood and affect.   LABORATORY PANEL:  Female CBC Recent Labs  Lab 01/26/18 0517  WBC 12.8*  HGB 13.3  HCT 38.0  PLT 114*   ------------------------------------------------------------------------------------------------------------------ Chemistries  Recent Labs  Lab 01/23/18 2217  01/26/18 0517  NA 138   < > 141  K 2.6*   < > 3.1*  CL 97*   < > 105  CO2 9*   < > 29  GLUCOSE 1,064*   < > 263*  BUN 72*   < > 21*  CREATININE 2.18*   < > 0.42*  CALCIUM 9.2   < > 8.2*  MG  --    < > 2.1  AST 17  --   --   ALT 20  --   --   ALKPHOS 82  --   --   BILITOT 2.2*  --   --    < > = values in this interval not displayed.   RADIOLOGY:  No results found. ASSESSMENT AND PLAN:   DKA (diabetic ketoacidoses)  Improved with IV insulin and IV fluid support.  Hyperglycemia due to diabetes 2.  Increased NovoLog 70/30 30 units twice daily and continue sliding scale.  Per diabetes coordinator, changed to Lantus 50 units at bedtime.  BS is 275 this am, increase to 55 units at bedtime.  Hypokalemia.  Continue IV and p.o. potassium, follow-up level. Hypernatremia.  Improved.    AKI (acute kidney injury) (HCC) -related to DKA as above, still dehydration, improved with IV fluid support.  Leukocytosis.  Possible due to reaction to DKA, no source of infection so far.  Improved.  Acute metabolic encephalopathy due to above.  Improved.   HTN (hypertension), resumed lisinopril and HCTZ.  Discontinued HCTZ due to hypokalemia.  Started  Norvasc.   Narcotic abuse and withdrawal.  Would not start methadone or any other narcotic medication either for detox for maintenance this moment without further information; Suboxone for 3 days and also suggested the addition of Imodium per Dr. Toni Amend.  Diarrhea.  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  Imodium as needed.  Tobacco abuse.  Smoking cessation was counseled for 4 minutes.  Generalized weakness.  PT evaluation suggest nursing facility placement. All the records are reviewed and case discussed with Care Management/Social Worker. Management plans discussed with the patient, family and they are in agreement.  CODE STATUS: Full Code  TOTAL TIME TAKING CARE OF THIS PATIENT: 38 minutes.   More than 50% of the time was spent in counseling/coordination of care: YES  POSSIBLE D/C IN 2 DAYS, DEPENDING ON CLINICAL CONDITION.   Shaune Pollack M.D on 01/26/2018 at 1:08 PM  Between 7am to 6pm - Pager - [phone]  After 6pm go to [email or web address] - Therapist, nutritional Hospitalists

## 2018-01-26 NOTE — Plan of Care (Signed)
  Problem: Health Behavior/Discharge Planning: Goal: Ability to manage health-related needs will improve Outcome: Not Progressing Note:  Patient was possibly going to d/c today per doctor, however, she states she is too weak + nauseated. Patient to spend at least one more day in the hospital. Also, patient is living in an extended stay motel. Will continue to monitor. Jari Favre [facility]

## 2018-01-26 NOTE — Progress Notes (Addendum)
Inpatient Diabetes Program Recommendations  AACE/ADA: New Consensus Statement on Inpatient Glycemic Control (2019)  Target Ranges:  Prepandial:   less than 140 mg/dL      Peak postprandial:   less than 180 mg/dL (1-2 hours)      Critically ill patients:  140 - 180 mg/dL  Results for Karen Lindsey, Karen Lindsey (MRN [id number]) as of 01/26/2018 07:33  Ref. Range 01/25/2018 07:25 01/25/2018 11:31 01/25/2018 12:49 01/25/2018 16:42 01/25/2018 21:12 01/26/2018 03:51  Glucose-Capillary Latest Ref Range: 70 - 99 mg/dL 409 (H) 811 (H) 914 (H) 216 (H) 197 (H) 277 (H)   Review of Glycemic Control  Diabetes history: DM2 Outpatient Diabetes medications: Invokana 100 mg daily, 70/30 32 units BID, Metformin 500 mg QPM (Not taking Metformin due to GI intolerance) Current orders for Inpatient glycemic control: Lantus 50 units QHS, Novolog 0-20 units TID with meals, Novolog 0-5 units QHS  Inpatient Diabetes Program Recommendations:  Insulin - Basal: Please consider increasing Lantus to 55 units QHS. Correction (SSI): Please consider changing frequency of CBGs and Novolog to Q4H if patient continues not to eat. HgbA1C: A1C 11.4% on 11/08/17 indicating an average glucose of 280 mg/dl over the past 2-3 months.  Inpatient Diabetes Coordinator spoke with patient on 11/09/17 during last hospital admission regarding DM control.  Addendum 01/26/18@11 :50-Spoke with patient about diabetes and home regimen for diabetes control. Patient reports that she is taking 70/30 32 units BID and Invokana 100 mg daily as an outpatient for diabetes control. Inquired about Metformin and patient reports that she does not take the Metformin due to GI intolerance.  Patient admits that she does not always remember to take the 70/30 twice a day.  Patient states that she checks her glucose 1-3 times per day and that it ranges from 100-300's mg/dl.  Discussed last A1C results in the chart (11.4% on 11/08/17) and explained that A1C indicates an average glucose of 280  mg/dl over the past 2-3 months. Discussed glucose and A1C goals. Discussed importance of checking CBGs and maintaining good CBG control to prevent long-term and short-term complications. Explained how hyperglycemia leads to damage within blood vessels which lead to the common complications seen with uncontrolled diabetes. Patient reports that she has neuropathy that is extremely painful. Patient asked if she was getting gabapentin as an inpatient like she takes outpatient. Informed patient I would ask her nurse to review medications and discuss with her. Stressed to the patient the importance of improving glycemic control to prevent further complications from uncontrolled diabetes. Discussed impact of nutrition, exercise, stress, sickness, and medications on diabetes control.Patient states that she was found unresponsive at her motel room and she plans to do a better job at taking care of herself as she wants to be healthy and spend time with her 2 grandchildren. Patient asked why she was not allowed to order food she wanted off the menu; reports that she wanted to order Fruit Loops for breakfast in the morning but was told she could not order Fruit Loops but could order Frosted Flakes on the carb modified diet. Reviewed the menu and Fruit Loops are 19 grams of carbs and Frosted Flakes are 25 grams of carbs. Unsure why patient could not have the Fruit Loops especially since they are lower in carbs than Frosted Flakes. Called dietary [phone]) and left message asking them to review patient food request and to see if she could be allowed to get Fruit Loops in the morning.  Encouraged patient to check her glucose 3-4 times  per day and to keep a log book of glucose readings and insulin taken which she will need to take to doctor appointments. Explained how the doctor she follows up with can use the log book to continue to make insulin adjustments if needed. Patient reports that she will need more insulin at time of  discharge because her insulin she had got hot outside and does not work. Patient reports that she usually gets medications via mail from [facility] but she will need help with getting more insulin at time of discharge. Placed consult for CM as patient will likely need to get more insulin from [facility] at time of discharge.  Patient verbalized understanding of information discussed and she states that she has no further questions at this time related to diabetes. However, she wanted to see if nurse could ask MD about changing potassium tablet to liquid potassium because she can not swallow the tablet well and she forgot to discuss with MD during round today. Talked with Brett Canales, RN regarding patient request to change potassium tablet to liquid and to see if she was ordered gabapentin as an inpatient.  Thanks, Orlando Penner, RN, MSN, CDE Diabetes Coordinator Inpatient Diabetes Program [phone] (Team Pager from 8am to 5pm)

## 2018-01-27 LAB — BASIC METABOLIC PANEL
Anion gap: 6 (ref 5–15)
BUN: 19 mg/dL (ref 6–20)
CO2: 32 mmol/L (ref 22–32)
Calcium: 8.5 mg/dL — ABNORMAL LOW (ref 8.9–10.3)
Chloride: 106 mmol/L (ref 98–111)
Creatinine, Ser: 0.33 mg/dL — ABNORMAL LOW (ref 0.44–1.00)
GFR calc Af Amer: >60 mL/min (ref >60)
GLUCOSE: 155 mg/dL — AB (ref 70–99)
POTASSIUM: 3.7 mmol/L (ref 3.5–5.1)
Sodium: 144 mmol/L (ref 135–145)

## 2018-01-27 LAB — PHOSPHORUS: Phosphorus: 3.4 mg/dL (ref 2.5–4.6)

## 2018-01-27 LAB — GLUCOSE, CAPILLARY
GLUCOSE-CAPILLARY: 108 mg/dL — AB (ref 70–99)
GLUCOSE-CAPILLARY: 134 mg/dL — AB (ref 70–99)
Glucose-Capillary: 241 mg/dL — ABNORMAL HIGH (ref 70–99)
Glucose-Capillary: 297 mg/dL — ABNORMAL HIGH (ref 70–99)
Glucose-Capillary: 197 mg/dL — ABNORMAL HIGH (ref 70–99)

## 2018-01-27 LAB — MAGNESIUM: Magnesium: 2.2 mg/dL (ref 1.7–2.4)

## 2018-01-27 MED ORDER — PREMIER PROTEIN SHAKE: 11.0000 [oz_av] | Freq: Two times a day (BID) | ORAL | 0 refills | Status: DC

## 2018-01-27 MED ORDER — POTASSIUM CHLORIDE CRYS ER 20 MEQ PO TBCR
40.0000 meq | EXTENDED_RELEASE_TABLET | Freq: Two times a day (BID) | ORAL | Status: DC
  Administered 2018-01-27 – 2018-01-28 (×5): 40 meq via ORAL
  Filled 2018-01-27 (×5): qty 2

## 2018-01-27 MED ORDER — INSULIN GLARGINE 100 UNIT/ML ~~LOC~~ SOLN: 10.0000 [IU] | Freq: Every day | SUBCUTANEOUS | 11 refills | Status: DC

## 2018-01-27 MED ORDER — METHOCARBAMOL 500 MG PO TABS
750.0000 mg | ORAL_TABLET | Freq: Three times a day (TID) | ORAL | Status: DC | PRN
  Administered 2018-01-27 – 2018-01-31 (×7): 750 mg via ORAL
  Filled 2018-01-27 (×6): qty 1
  Filled 2018-01-27 (×2): qty 1.5
  Filled 2018-01-27 (×2): qty 1
  Filled 2018-01-27 (×2): qty 1.5
  Filled 2018-01-27 (×2): qty 1

## 2018-01-27 MED ORDER — ONDANSETRON HCL 4 MG PO TABS
4.0000 mg | ORAL_TABLET | Freq: Three times a day (TID) | ORAL | Status: DC | PRN
Start: 2018-01-27 — End: 2018-01-31
  Administered 2018-01-27 – 2018-01-28 (×2): 4 mg via ORAL
  Filled 2018-01-27 (×2): qty 1

## 2018-01-27 MED ORDER — AMLODIPINE BESYLATE 10 MG PO TABS: 10.0000 mg | ORAL_TABLET | Freq: Every day | ORAL | 0 refills | Status: DC

## 2018-01-27 MED ORDER — GABAPENTIN 400 MG PO CAPS: 1200.0000 mg | ORAL_CAPSULE | Freq: Three times a day (TID) | ORAL | 0 refills | Status: DC

## 2018-01-27 MED ORDER — AMITRIPTYLINE HCL 10 MG PO TABS
10.0000 mg | ORAL_TABLET | Freq: Every day | ORAL | Status: DC
  Administered 2018-01-27 – 2018-01-30 (×4): 10 mg via ORAL
  Filled 2018-01-27 (×5): qty 1

## 2018-01-27 MED ORDER — INSULIN ASPART 100 UNIT/ML ~~LOC~~ SOLN: 0.0000 [IU] | Freq: Every day | SUBCUTANEOUS | 11 refills | Status: DC

## 2018-01-27 MED ORDER — LISINOPRIL 20 MG PO TABS: 20.0000 mg | ORAL_TABLET | Freq: Every day | ORAL | 0 refills | Status: DC

## 2018-01-27 NOTE — Plan of Care (Signed)
  Problem: Health Behavior/Discharge Planning: Goal: Ability to manage health-related needs will improve Outcome: Not Progressing Note:  Patient declined d/c today stating to the physician that she didn't feel well enough to go home. Probable d/c in the morning. Will continue to monitor status. Jari Favre [facility]

## 2018-01-27 NOTE — Plan of Care (Signed)
  Problem: Activity: Goal: Risk for activity intolerance will decrease Outcome: Progressing   Problem: Coping: Goal: Level of anxiety will decrease Outcome: Not Progressing  Tearful at times.

## 2018-01-27 NOTE — Consult Note (Signed)
[facility] Consult   Reason for Consult: Consult to follow-up with this 52 year old woman who came into the [facility] with altered mental status probably related to narcotic withdrawal Referring Physician: [name] Patient Identification: [facility] MRN:  [id number] Principal Diagnosis: Acute delirium Diagnosis:   Patient Active Problem List   Diagnosis Date Noted  . Narcotic abuse ([facility]) [F11.10] 01/24/2018  . Acute delirium [R41.0] 01/24/2018  . DKA (diabetic ketoacidoses) ([place]) [E13.10] 01/23/2018  . AKI (acute kidney injury) ([place]) [N17.9] 01/23/2018  . HTN (hypertension) [I10] 01/23/2018  . Chest pain [R07.9] 11/08/2017    Total Time spent with patient: 30 minutes  Subjective:   [facility] is a 52 y.o. female patient admitted with "I am scared".  HPI: Patient seen chart reviewed.  I have been following up with this patient for a few days now.  52 year old woman who presented to the [facility] with altered mental status and symptoms that could be largely related to withdrawal from methadone.  Patient has been given Suboxone for the last couple days but by law this can only go on for 3 days in the [facility].  Patient's complaint today is that she is frightened of what will happen when she leaves the [facility].  She is frightened that she will go back to having symptoms of opiate withdrawal which she found miserable previously.  She is also frightened of how she is going to manage her chronic pain.  Patient is having some problems with sleep.  Appetite is reasonably okay.  She is feeling down and discouraged but it seems to be largely related to her fear about her pain and withdrawal.  She denies suicidal or homicidal thought.  No sign of psychosis.  Patient indicates that she does not want to go back to the methadone clinic which was too expensive anyway.  We talked about maintenance Suboxone treatment but the patient does not have Medicaid or anything that would pay for the  medication which is prohibitively expensive.  In any case we would not be able to do that at discharge from the [facility] anyway.  Patient is asking whether she can be given a low dose narcotic pain medicine chronically.  I advised her that that was not my decision to make because I did not treat chronic pain but that the important question would be whether she had an outpatient doctor who would be willing to do that long-term.  Past Psychiatric History: Patient has a history of some anxiety and depression symptoms a lot of chronic pain and a history of narcotic abuse that had been controlled with methadone for a long time  Risk to Self:   Risk to Others:   Prior Inpatient Therapy:   Prior Outpatient Therapy:    Past Medical History:  Past Medical History:  Diagnosis Date  . Arthritis    rheumatoid  . Chronic pain    a. upper/mid back.  . Diabetic peripheral neuropathy ([facility])   . Fatty liver   . Hypertension   . Insulin dependent diabetes mellitus ([facility])    a. Dx ~ 2011.  . Narcotic addiction ([facility])    a. Followed in substance abuse center in [place] - on chronic methadone.  . Neuropathy   . RA (rheumatoid arthritis) ([facility])   . Tobacco abuse     Past Surgical History:  Procedure Laterality Date  . ABDOMINAL HYSTERECTOMY    . CESAREAN SECTION    . CHOLECYSTECTOMY    . KNEE SURGERY    .  MANDIBLE FRACTURE SURGERY     Family History: No family history on file. Family Psychiatric  History: See previous note Social History:  Social History   Substance and Sexual Activity  Alcohol Use No   Comment: Hasn't had a drink since 1998     Social History   Substance and Sexual Activity  Drug Use Yes   Comment: Previously abused pain pills. prescribed methadone     Social History   Socioeconomic History  . Marital status: Legally Separated    Spouse name: Not on file  . Number of children: Not on file  . Years of education: Not on file  . Highest education level: Not on file   Occupational History    Comment: Works part-time in [employer], [employer]  Social Needs  . Financial resource strain: Not on file  . Food insecurity:    Worry: Not on file    Inability: Not on file  . Transportation needs:    Medical: Not on file    Non-medical: Not on file  Tobacco Use  . Smoking status: Current Every Day Smoker    Packs/day: 0.50    Types: Cigarettes  . Smokeless tobacco: Never Used  . Tobacco comment: 30+ years  Substance and Sexual Activity  . Alcohol use: No    Comment: Hasn't had a drink since 1998  . Drug use: Yes    Comment: Previously abused pain pills. prescribed methadone   . Sexual activity: Not on file  Lifestyle  . Physical activity:    Days per week: Not on file    Minutes per session: Not on file  . Stress: Not on file  Relationships  . Social connections:    Talks on phone: Not on file    Gets together: Not on file    Attends religious service: Not on file    Active member of club or organization: Not on file    Attends meetings of clubs or organizations: Not on file    Relationship status: Not on file  Other Topics Concern  . Not on file  Social History Narrative   Lives locally with friends/roomates.   Additional Social History:    Allergies:   Allergies  Allergen Reactions  . Ultram [Tramadol Hcl] Palpitations    Labs:  Results for orders placed or performed during the [facility] encounter of 01/23/18 (from the past 48 hour(s))  Potassium     Status: Abnormal   Collection Time: 01/25/18  6:31 PM  Result Value Ref Range   Potassium 3.3 (L) 3.5 - 5.1 mmol/L    Comment: HEMOLYSIS AT THIS LEVEL MAY AFFECT RESULT Performed at [facility], [address], [place], [place] [address]   Phosphorus     Status: Abnormal   Collection Time: 01/25/18  6:31 PM  Result Value Ref Range   Phosphorus 1.9 (L) 2.5 - 4.6 mg/dL    Comment: Performed at [facility], [place] [place], [place] [address]  Glucose,  capillary     Status: Abnormal   Collection Time: 01/25/18  8:38 PM  Result Value Ref Range   Glucose-Capillary 169 (H) 70 - 99 mg/dL   Comment 1 Notify RN    Comment 2 Document in Chart   Glucose, capillary     Status: Abnormal   Collection Time: 01/25/18  9:12 PM  Result Value Ref Range   Glucose-Capillary 197 (H) 70 - 99 mg/dL  Glucose, capillary     Status: Abnormal  Collection Time: 01/26/18  3:51 AM  Result Value Ref Range   Glucose-Capillary 277 (H) 70 - 99 mg/dL  Basic metabolic panel     Status: Abnormal   Collection Time: 01/26/18  5:17 AM  Result Value Ref Range   Sodium 141 135 - 145 mmol/L   Potassium 3.1 (L) 3.5 - 5.1 mmol/L   Chloride 105 98 - 111 mmol/L    Comment: Please note change in reference range.   CO2 29 22 - 32 mmol/L   Glucose, Bld 263 (H) 70 - 99 mg/dL    Comment: Please note change in reference range.   BUN 21 (H) 6 - 20 mg/dL    Comment: Please note change in reference range.   Creatinine, Ser 0.42 (L) 0.44 - 1.00 mg/dL   Calcium 8.2 (L) 8.9 - 10.3 mg/dL   GFR calc non Af Amer >60 >60 mL/min   GFR calc Af Amer >60 >60 mL/min    Comment: (NOTE) The eGFR has been calculated using the CKD EPI equation. This calculation has not been validated in all clinical situations. eGFR's persistently <60 mL/min signify possible Chronic Kidney Disease.    Anion gap 7 5 - 15    Comment: Performed at [facility], [address] [place], [place] [address]  Magnesium     Status: None   Collection Time: 01/26/18  5:17 AM  Result Value Ref Range   Magnesium 2.1 1.7 - 2.4 mg/dL    Comment: Performed at [facility], [address] [place],  [place] [address]  Phosphorus     Status: None   Collection Time: 01/26/18  5:17 AM  Result Value Ref Range   Phosphorus 3.3 2.5 - 4.6 mg/dL    Comment: Performed at [facility], [address] [place], [place] [address]  CBC     Status: Abnormal   Collection Time: 01/26/18  5:17 AM  Result  Value Ref Range   WBC 12.8 (H) 3.6 - 11.0 K/uL   RBC 4.51 3.80 - 5.20 MIL/uL   Hemoglobin 13.3 12.0 - 16.0 g/dL   HCT 38.0 35.0 - 47.0 %   MCV 84.3 80.0 - 100.0 fL   MCH 29.5 26.0 - 34.0 pg   MCHC 35.0 32.0 - 36.0 g/dL   RDW 12.7 11.5 - 14.5 %   Platelets 114 (L) 150 - 440 K/uL    Comment: Performed at [facility], [address] [place]. [place],  [address]  Glucose, capillary     Status: Abnormal   Collection Time: 01/26/18  8:27 AM  Result Value Ref Range   Glucose-Capillary 229 (H) 70 - 99 mg/dL   Comment 1 Notify RN    Comment 2 Document in Chart   Glucose, capillary     Status: Abnormal   Collection Time: 01/26/18 11:10 AM  Result Value Ref Range   Glucose-Capillary 275 (H) 70 - 99 mg/dL   Comment 1 Notify RN    Comment 2 Document in Chart   Glucose, capillary     Status: Abnormal   Collection Time: 01/26/18  4:17 PM  Result Value Ref Range   Glucose-Capillary 149 (H) 70 - 99 mg/dL  Glucose, capillary     Status: None   Collection Time: 01/26/18  9:38 PM  Result Value Ref Range   Glucose-Capillary 94 70 - 99 mg/dL  Glucose, capillary     Status: None   Collection Time: 01/26/18 11:05 PM  Result Value Ref Range   Glucose-Capillary  95 70 - 99 mg/dL  Glucose, capillary     Status: Abnormal   Collection Time: 01/27/18  3:55 AM  Result Value Ref Range   Glucose-Capillary 108 (H) 70 - 99 mg/dL  Basic metabolic panel     Status: Abnormal   Collection Time: 01/27/18  7:41 AM  Result Value Ref Range   Sodium 144 135 - 145 mmol/L   Potassium 3.7 3.5 - 5.1 mmol/L   Chloride 106 98 - 111 mmol/L    Comment: Please note change in reference range.   CO2 32 22 - 32 mmol/L   Glucose, Bld 155 (H) 70 - 99 mg/dL    Comment: Please note change in reference range.   BUN 19 6 - 20 mg/dL    Comment: Please note change in reference range.   Creatinine, Ser 0.33 (L) 0.44 - 1.00 mg/dL   Calcium 8.5 (L) 8.9 - 10.3 mg/dL   GFR calc non Af Amer >60 >60 mL/min   GFR calc Af  Amer >60 >60 mL/min    Comment: (NOTE) The eGFR has been calculated using the CKD EPI equation. This calculation has not been validated in all clinical situations. eGFR's persistently <60 mL/min signify possible Chronic Kidney Disease.    Anion gap 6 5 - 15    Comment: Performed at [facility], [address] [place], [place] [address]  Magnesium     Status: None   Collection Time: 01/27/18  7:41 AM  Result Value Ref Range   Magnesium 2.2 1.7 - 2.4 mg/dL    Comment: Performed at [facility], [address] [place], [place] [address]  Phosphorus     Status: None   Collection Time: 01/27/18  7:41 AM  Result Value Ref Range   Phosphorus 3.4 2.5 - 4.6 mg/dL    Comment: Performed at [facility], [address] [place], [place] [address]  Glucose, capillary     Status: Abnormal   Collection Time: 01/27/18  7:56 AM  Result Value Ref Range   Glucose-Capillary 134 (H) 70 - 99 mg/dL   Comment 1 Notify RN   Glucose, capillary     Status: Abnormal   Collection Time: 01/27/18 11:58 AM  Result Value Ref Range   Glucose-Capillary 241 (H) 70 - 99 mg/dL  Glucose, capillary     Status: Abnormal   Collection Time: 01/27/18  4:58 PM  Result Value Ref Range   Glucose-Capillary 297 (H) 70 - 99 mg/dL    Current Facility-Administered Medications  Medication Dose Route Frequency Provider Last Rate Last Dose  . acetaminophen (TYLENOL) tablet 650 mg  650 mg Oral Q6H PRN Awilda Bill, NP   650 mg at 01/24/18 2033  . amitriptyline (ELAVIL) tablet 10 mg  10 mg Oral QHS Crystalmarie Yasin T, MD      . amLODipine (NORVASC) tablet 10 mg  10 mg Oral Daily Demetrios Loll, MD   10 mg at 01/27/18 0906  . buprenorphine-naloxone (SUBOXONE) 8-2 mg per SL tablet 1 tablet  1 tablet Sublingual BID Emme Rosenau, Madie Reno, MD   1 tablet at 01/27/18 0905  . famotidine (PEPCID) tablet 20 mg  20 mg Oral BID Lahoma Rocker, MD   20 mg at 01/27/18 0906  . gabapentin (NEURONTIN) capsule 1,200 mg  1,200 mg  Oral TID Cheyann Blecha T, MD   1,200 mg at 01/27/18 1515  . hydrALAZINE (APRESOLINE) injection 10-20 mg  10-20 mg Intravenous Q4H PRN Awilda Bill, NP  20 mg at 01/24/18 2359  . insulin aspart (novoLOG) injection 0-20 Units  0-20 Units Subcutaneous TID WC Demetrios Loll, MD   7 Units at 01/27/18 1202  . insulin aspart (novoLOG) injection 0-5 Units  0-5 Units Subcutaneous QHS Demetrios Loll, MD      . insulin glargine (LANTUS) injection 10 Units  10 Units Subcutaneous QHS Amelia Jo, MD      . lisinopril (PRINIVIL,ZESTRIL) tablet 20 mg  20 mg Oral Daily Lahoma Rocker, MD   20 mg at 01/27/18 0905  . loperamide (IMODIUM) capsule 4 mg  4 mg Oral PRN Prynce Jacober, Madie Reno, MD   4 mg at 01/27/18 1212  . methocarbamol (ROBAXIN) tablet 750 mg  750 mg Oral Q8H PRN Ximenna Fonseca T, MD      . multivitamin with minerals tablet 1 tablet  1 tablet Oral Daily Demetrios Loll, MD   1 tablet at 01/27/18 [phone]  . nicotine (NICODERM CQ - dosed in mg/24 hours) patch 14 mg  14 mg Transdermal Daily Lahoma Rocker, MD   14 mg at 01/27/18 0906  . ondansetron (ZOFRAN) tablet 4 mg  4 mg Oral Q8H PRN Etoy Mcdonnell T, MD      . potassium chloride SA (K-DUR,KLOR-CON) CR tablet 40 mEq  40 mEq Oral BID Amelia Jo, MD   40 mEq at 01/27/18 0906  . protein supplement (PREMIER PROTEIN) liquid - approved for s/p bariatric surgery  11 oz Oral BID BM Lahoma Rocker, MD   11 oz at 01/27/18 1307  . zolpidem (AMBIEN) tablet 5 mg  5 mg Oral QHS PRN Henreitta Leber, MD   5 mg at 01/27/18 0024    Musculoskeletal: Strength & Muscle Tone: within normal limits Gait & Station: normal Patient leans: N/A  Psychiatric Specialty Exam: Physical Exam  Nursing note and vitals reviewed. Constitutional: She appears well-developed and well-nourished.  HENT:  Head: Normocephalic and atraumatic.  Eyes: Pupils are equal, round, and reactive to light. Conjunctivae are normal.  Neck: Normal range of motion.  Cardiovascular: Regular rhythm and normal heart sounds.   Respiratory: Effort normal. No respiratory distress.  GI: Soft.  Musculoskeletal: Normal range of motion.  Neurological: She is alert.  Skin: Skin is warm and dry.  Psychiatric: Her speech is normal and behavior is normal. Judgment and thought content normal. Her mood appears anxious. Cognition and memory are normal. She exhibits a depressed mood.    Review of Systems  Constitutional: Negative.   HENT: Negative.   Eyes: Negative.   Respiratory: Negative.   Cardiovascular: Negative.   Gastrointestinal: Negative.   Musculoskeletal: Positive for back pain and joint pain.  Skin: Negative.   Neurological: Negative.   Psychiatric/Behavioral: Negative for depression, hallucinations, memory loss, substance abuse and suicidal ideas. The patient is nervous/anxious and has insomnia.     Blood pressure 114/70, pulse 80, temperature 98.5 F (36.9 C), temperature source Oral, resp. rate 18, height _0  (1.549 m), weight 54.4 kg (120 lb), SpO2 98 %.Body mass index is 22.67 kg/m.  General Appearance: Casual  Eye Contact:  [place]  Speech:  Clear and Coherent  Volume:  Normal  Mood:  Anxious and Dysphoric  Affect:  Congruent  Thought Process:  Goal Directed  Orientation:  Full (Time, Place, and Person)  Thought Content:  Logical  Suicidal Thoughts:  No  Homicidal Thoughts:  No  Memory:  Immediate;   Fair Recent;   Fair Remote;   Fair  Judgement:  Fair  Insight:  Fair  Psychomotor Activity:  Normal  Concentration:  Concentration: Fair  Recall:  [employer] of Knowledge:  Fair  Language:  Fair  Akathisia:  No  Handed:  Right  AIMS (if indicated):     Assets:  Communication Skills Desire for Improvement Resilience  ADL's:  Intact  Cognition:  [facility]  Sleep:        Treatment Plan Summary: Daily contact with patient to assess and evaluate symptoms and progress in treatment, Medication management and Plan 52 year old woman who is justifiably worried about how she will feel after  discharge.  She has chronic pain which for a long time was at least partially controlled because she was on chronic methadone.  She is afraid of the return of the pain as well as the return of opiate withdrawal symptoms.  I spent some time with the patient doing some education and supportive counseling.  I acknowledged to her that her fears are justified.  She probably is going to feel uncomfortable after discharge.  We discussed things that can be done.  I suggested that muscle relaxers such as Robaxin are often useful during opiate withdrawal.  Antinausea medicine such as Zofran often provide relief.  Imodium can continue to be used for the near term if diarrhea returns.  Additionally we talked about her chronic pain disorder.  I suggested adding a very small dose of amitriptyline at night as part of her longer term chronic pain regimen along with her gabapentin.  Patient says she thinks she is taking this medicine before and did not like it but I think this should be a much smaller dose than what she probably had.  Educated her that this was not going to be a quick fix for pain but that long-term she needed to have a diverse pain management strategy.  No indication for any psychiatric hospitalization.  Orders written.  Recommend she be given the Robaxin the Zofran the Imodium at discharge.  I will check by again tomorrow if she is available.  Disposition: No evidence of imminent risk to self or others at present.   Patient does not meet criteria for psychiatric inpatient admission. Supportive therapy provided about ongoing stressors. Discussed crisis plan, support from social network, calling 911, coming to the Emergency Department, and calling Suicide Hotline.  Alethia Berthold, MD 01/27/2018 5:19 PM

## 2018-01-27 NOTE — Plan of Care (Signed)
  Problem: Nutrition: Goal: Adequate nutrition will be maintained Outcome: Not Progressing   

## 2018-01-27 NOTE — Progress Notes (Signed)
Notified MD of blood sugar of 95 and that pt is scheduled for 55 units of lantus. New orders placed. Pt stating that not able to take the packets of potassium stating that she did not like the taste. Pt stating that she would try and take the pill form. Orders placed. Will continue to monitor and assess.

## 2018-01-27 NOTE — Progress Notes (Signed)
[facility] [place] [facility]   PATIENT NAME: [facility] [place]    MR#:  [id number]  DATE OF BIRTH:  [birth year]  SUBJECTIVE:  CHIEF COMPLAINT:   Chief Complaint  Patient presents with  . Altered Mental Status    patient says her legs are hurting and she feels depressed. REVIEW OF SYSTEMS:  Review of Systems  Constitutional: Positive for malaise/fatigue. Negative for chills and fever.  HENT: Negative for sore throat.   Eyes: Negative for blurred vision and double vision.  Respiratory: Negative for cough, hemoptysis, shortness of breath, wheezing and stridor.   Cardiovascular: Negative for chest pain, palpitations, orthopnea and leg swelling.  Gastrointestinal: Negative for abdominal pain, blood in stool, constipation, diarrhea, melena, nausea and vomiting.  Genitourinary: Negative for dysuria, flank pain and hematuria.  Musculoskeletal: Negative for back pain and joint pain.  Neurological: Negative for dizziness, sensory change, focal weakness, seizures, loss of consciousness, weakness and headaches.  Endo/Heme/Allergies: Negative for polydipsia.  Psychiatric/Behavioral: Negative for depression. The patient is not nervous/anxious.     DRUG ALLERGIES:   Allergies  Allergen Reactions  . Ultram [Tramadol Hcl] Palpitations   VITALS:  Blood pressure 103/64, pulse 71, temperature 98.3 F (36.8 C), temperature source Oral, resp. rate 18, height 5\' 1"  (1.549 m), weight 54.4 kg (120 lb), SpO2 99 %. PHYSICAL EXAMINATION:  Physical Exam  Constitutional: She is oriented to person, place, and time. She appears well-developed. No distress.  HENT:  Head: Normocephalic.  Eyes: Pupils are equal, round, and reactive to light. Conjunctivae are normal. No scleral icterus.  Neck: Neck supple. No JVD present. No tracheal deviation present.  Cardiovascular: Normal rate, regular rhythm and normal heart sounds. Exam reveals no gallop.  No murmur heard. Pulmonary/Chest:  Effort normal and breath sounds normal. No respiratory distress. She has no wheezes. She has no rales.  Abdominal: Soft. Bowel sounds are normal. She exhibits no distension. There is no tenderness. There is no rebound.  Musculoskeletal: She exhibits no edema or tenderness.  Neurological: She is alert and oriented to person, place, and time. No cranial nerve deficit.  Skin: No rash noted. No erythema.  Psychiatric: She has a normal mood and affect.   LABORATORY PANEL:  Female CBC Recent Labs  Lab 01/26/18 0517  WBC 12.8*  HGB 13.3  HCT 38.0  PLT 114*   ------------------------------------------------------------------------------------------------------------------ Chemistries  Recent Labs  Lab 01/23/18 2217  01/27/18 0741  NA 138   < > 144  K 2.6*   < > 3.7  CL 97*   < > 106  CO2 9*   < > 32  GLUCOSE 1,064*   < > 155*  BUN 72*   < > 19  CREATININE 2.18*   < > 0.33*  CALCIUM 9.2   < > 8.5*  MG  --    < > 2.2  AST 17  --   --   ALT 20  --   --   ALKPHOS 82  --   --   BILITOT 2.2*  --   --    < > = values in this interval not displayed.   RADIOLOGY:  No results found. ASSESSMENT AND PLAN:   DKA (diabetic ketoacidoses)  Improved with IV insulin and IV fluid support.  Hyperglycemia due to diabetes 2.  Istarted on Lantus, sliding scale coverage.    Hypokalemia.   improved with supplements.    Hypernatremia.  Improved.    AKI (acute kidney injury) (HCC) -related to  DKA as above, still dehydration, improved with IV fluid support.  Leukocytosis.  Possible due to reaction to DKA, no source of infection so far.  Improved.  Acute metabolic encephalopathy due to above.  Improved.   HTN (hypertension), resumed lisinopril and HCTZ.  Discontinued HCTZ due to hypokalemia.  Started Norvasc.   Narcotic abuse and withdrawal.  Diarrhea.  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  Imodium as needed.  Seen by Dr. Toni Amend from psychiatry .  Patient told him that she cannot afford methadone or  Suboxone but she is requesting to talk to him again and also case manager regarding federal programs and help from government for medications for chronic pain.   Tobacco abuse.  Smoking cessation was counseled for 4 minutes.  Generalized weakness.  Physical therapy today  recommended home health physical therapy  Unable To discharge patient today because patient feels depressed about her pain in the legs, wants to talk to  psychiatry again.  All the records are reviewed and case discussed with Care Management/Social Worker. Management plans discussed with the patient, family and they are in agreement.  CODE STATUS: Full Code  TOTAL TIME TAKING CARE OF THIS PATIENT: 38 minutes.   More than 50% of the time was spent in counseling/coordination of care: YES  POSSIBLE D/C IN 2 DAYS, DEPENDING ON CLINICAL CONDITION.   Katha Hamming M.D on 01/27/2018 at 12:12 PM  Between 7am to 6pm - Pager - [phone]  After 6pm go to [email or web address] - Therapist, nutritional Hospitalists

## 2018-01-27 NOTE — Progress Notes (Signed)
Physical Therapy Treatment Patient Details Name: Karen Lindsey MRN: [id number] DOB: 09-03-1965 Today's Date: 01/27/2018    History of Present Illness Pt is a 52 year old female who was admitted to [facility] for DKA following AMS.  PMH includes RA, narcotic addiction, DM, Htn, fatty liver and neuropathy.    PT Comments    Pt reported walking to/from door this am with nursing staff.  Transitioned in and out of bed with ease.  Stood with supervision and was able to ambulate 200' in hallway with min guard/supervision with no LOB or buckling noted.  Pt does pick up RW at times and education provided. Stated she used Standard walker in past with a knee surgery.  Discussed and stated she prefers RW.  Does not have a walker at home but will benefit from one upon discharge.  To commode after session and she was able to attend to all needs in standing without UE support and without LOB.   Follow Up Recommendations  Home health PT     Equipment Recommendations  Rolling walker with 5" wheels    Recommendations for Other Services       Precautions / Restrictions Precautions Precautions: Fall Restrictions Weight Bearing Restrictions: No    Mobility  Bed Mobility Overal bed mobility: Modified Independent                Transfers Overall transfer level: Modified independent   Transfers: Sit to/from Stand Sit to Stand: Modified independent (Device/Increase time)            Ambulation/Gait Ambulation/Gait assistance: Supervision;Min guard Gait Distance (Feet): 200 Feet Assistive device: Rolling walker (2 wheeled) Gait Pattern/deviations: Step-through pattern Gait velocity: decreased   General Gait Details: picks up rolling walker at times but prefers it over standard walker   Stairs             Wheelchair Mobility    Modified Rankin (Stroke Patients Only)       Balance Overall balance assessment: Needs assistance Sitting-balance support: Feet supported Sitting  balance-Leahy Scale: Good     Standing balance support: No upper extremity supported Standing balance-Leahy Scale: Fair Standing balance comment: able to attend to clothing and attend needs in standing without UE support                            Cognition Arousal/Alertness: Awake/alert Behavior During Therapy: WFL for tasks assessed/performed Overall Cognitive Status: Within Functional Limits for tasks assessed                                        Exercises      General Comments        Pertinent Vitals/Pain Pain Assessment: No/denies pain    Home Living                      Prior Function            PT Goals (current goals can now be found in the care plan section) Progress towards PT goals: Progressing toward goals    Frequency    Min 2X/week      PT Plan Discharge plan needs to be updated    Co-evaluation              AM-PAC PT "6 Clicks" Daily Activity  Outcome Measure  Difficulty turning  over in bed (including adjusting bedclothes, sheets and blankets)?: None Difficulty moving from lying on back to sitting on the side of the bed? : None Difficulty sitting down on and standing up from a chair with arms (e.g., wheelchair, bedside commode, etc,.)?: None Help needed moving to and from a bed to chair (including a wheelchair)?: A Little Help needed walking in hospital room?: A Little Help needed climbing 3-5 steps with a railing? : A Little 6 Click Score: 21    End of Session Equipment Utilized During Treatment: Gait belt Activity Tolerance: Patient tolerated treatment well Patient left: in bed;with bed alarm set;with call bell/phone within reach;with nursing/sitter in room Nurse Communication: Mobility status       Time: 2536-6440 PT Time Calculation (min) (ACUTE ONLY): 18 min  Charges:  $Gait Training: 8-22 mins                    G Codes:       Danielle Dess, PTA 01/27/18, 9:11 AM

## 2018-01-27 NOTE — Progress Notes (Signed)
Pharmacy Electrolyte Monitoring Consult:  Pharmacy consulted to assist in monitoring and replacing electrolytes in this 52 y.o. female admitted on 01/23/2018 with DKA.    Labs:  Sodium (mmol/L)  Date Value  01/27/2018 144  03/29/2014 142   Potassium (mmol/L)  Date Value  01/27/2018 3.7  03/29/2014 3.4 (L)   Magnesium (mg/dL)  Date Value  49/44/9675 2.2  03/29/2014 1.6 (L)   Phosphorus (mg/dL)  Date Value  91/63/8466 3.4   Calcium (mg/dL)  Date Value  59/93/5701 8.5 (L)   Calcium, Total (mg/dL)  Date Value  77/93/9030 8.5   Albumin (g/dL)  Date Value  04/02/3006 3.8  03/29/2014 3.2 (L)    Assessment/Plan:   Electrolytes WNL. No supplementation is warranted for today.  Recheck with am labs.   Pharmacy will continue to monitor and adjust per consult.   Stormy Card [facility] Clinical Pharmacist 01/27/2018 8:39 AM

## 2018-01-28 ENCOUNTER — Other Ambulatory Visit: Payer: Self-pay

## 2018-01-28 LAB — URINALYSIS, ROUTINE W REFLEX MICROSCOPIC
Bilirubin Urine: NEGATIVE
Glucose, UA: >=500 mg/dL — AB
KETONES UR: NEGATIVE mg/dL
Nitrite: NEGATIVE
PH: 7 (ref 5.0–8.0)
Protein, ur: NEGATIVE mg/dL
Specific Gravity, Urine: 1.007 (ref 1.005–1.030)

## 2018-01-28 LAB — BASIC METABOLIC PANEL
Anion gap: 3 — ABNORMAL LOW (ref 5–15)
BUN: 19 mg/dL (ref 6–20)
CHLORIDE: 110 mmol/L (ref 98–111)
CO2: 27 mmol/L (ref 22–32)
CREATININE: 0.59 mg/dL (ref 0.44–1.00)
Calcium: 8.2 mg/dL — ABNORMAL LOW (ref 8.9–10.3)
GFR calc non Af Amer: >60 mL/min (ref >60)
Glucose, Bld: 338 mg/dL — ABNORMAL HIGH (ref 70–99)
POTASSIUM: 4.5 mmol/L (ref 3.5–5.1)
SODIUM: 140 mmol/L (ref 135–145)

## 2018-01-28 LAB — GLUCOSE, CAPILLARY
GLUCOSE-CAPILLARY: 274 mg/dL — AB (ref 70–99)
GLUCOSE-CAPILLARY: 291 mg/dL — AB (ref 70–99)
GLUCOSE-CAPILLARY: 260 mg/dL — AB (ref 70–99)
Glucose-Capillary: 295 mg/dL — ABNORMAL HIGH (ref 70–99)

## 2018-01-28 MED ORDER — METHOCARBAMOL 750 MG PO TABS: 750.0000 mg | ORAL_TABLET | Freq: Three times a day (TID) | ORAL | 0 refills | Status: DC | PRN

## 2018-01-28 MED ORDER — INSULIN GLARGINE 100 UNIT/ML ~~LOC~~ SOLN: 10.0000 [IU] | Freq: Every day | SUBCUTANEOUS | 11 refills | Status: DC

## 2018-01-28 MED ORDER — AMITRIPTYLINE HCL 10 MG PO TABS: 10.0000 mg | ORAL_TABLET | Freq: Every day | ORAL | 0 refills | Status: DC

## 2018-01-28 MED ORDER — CEFTRIAXONE SODIUM 1 G IJ SOLR
1.0000 g | INTRAMUSCULAR | Status: DC
  Administered 2018-01-28 – 2018-01-29 (×2): 1 g via INTRAVENOUS
  Filled 2018-01-28: qty 10
  Filled 2018-01-28 (×2): qty 1

## 2018-01-28 MED ORDER — LOPERAMIDE HCL 2 MG PO CAPS: 4.0000 mg | ORAL_CAPSULE | ORAL | 0 refills | Status: DC | PRN

## 2018-01-28 MED ORDER — INSULIN GLARGINE 100 UNIT/ML ~~LOC~~ SOLN
13.0000 [IU] | Freq: Every day | SUBCUTANEOUS | Status: DC
  Administered 2018-01-28: 13 [IU] via SUBCUTANEOUS
  Filled 2018-01-28 (×2): qty 0.13

## 2018-01-28 MED ORDER — SODIUM CHLORIDE 0.9% FLUSH
3.0000 mL | Freq: Two times a day (BID) | INTRAVENOUS | Status: DC
  Administered 2018-01-28 – 2018-01-31 (×6): 3 mL via INTRAVENOUS

## 2018-01-28 MED ORDER — ONDANSETRON HCL 4 MG PO TABS: 4.0000 mg | ORAL_TABLET | Freq: Three times a day (TID) | ORAL | 0 refills | Status: DC | PRN

## 2018-01-28 NOTE — Progress Notes (Signed)
[employer] - [place] at [facility]   PATIENT NAME: Karen Lindsey    MR#:  [id number]  DATE OF BIRTH:  [birth year]  SUBJECTIVE:c/o dysuria.Bg high.more thn 300.  CHIEF COMPLAINT:   Chief Complaint  Patient presents with  . Altered Mental Status    patient says her legs are hurting and she feels depressed. REVIEW OF SYSTEMS:  Review of Systems  Constitutional: Positive for malaise/fatigue. Negative for chills and fever.  HENT: Negative for sore throat.   Eyes: Negative for blurred vision and double vision.  Respiratory: Negative for cough, hemoptysis, shortness of breath, wheezing and stridor.   Cardiovascular: Negative for chest pain, palpitations, orthopnea and leg swelling.  Gastrointestinal: Negative for abdominal pain, blood in stool, constipation, diarrhea, melena, nausea and vomiting.  Genitourinary: Negative for dysuria, flank pain and hematuria.  Musculoskeletal: Negative for back pain and joint pain.  Neurological: Negative for dizziness, sensory change, focal weakness, seizures, loss of consciousness, weakness and headaches.  Endo/Heme/Allergies: Negative for polydipsia.  Psychiatric/Behavioral: Negative for depression. The patient is not nervous/anxious.     DRUG ALLERGIES:   Allergies  Allergen Reactions  . Ultram [Tramadol Hcl] Palpitations   VITALS:  Blood pressure 117/65, pulse 83, temperature 98 F (36.7 C), temperature source Oral, resp. rate 19, height 5\' 1"  (1.549 m), weight 55.9 kg (123 lb 3.2 oz), SpO2 99 %. PHYSICAL EXAMINATION:  Physical Exam  Constitutional: She is oriented to person, place, and time. She appears well-developed. No distress.  HENT:  Head: Normocephalic.  Eyes: Pupils are equal, round, and reactive to light. Conjunctivae are normal. No scleral icterus.  Neck: Neck supple. No JVD present. No tracheal deviation present.  Cardiovascular: Normal rate, regular rhythm and normal heart sounds. Exam reveals no gallop.  No  murmur heard. Pulmonary/Chest: Effort normal and breath sounds normal. No respiratory distress. She has no wheezes. She has no rales.  Abdominal: Soft. Bowel sounds are normal. She exhibits no distension. There is no tenderness. There is no rebound.  Musculoskeletal: She exhibits no edema or tenderness.  Neurological: She is alert and oriented to person, place, and time. No cranial nerve deficit.  Skin: No rash noted. No erythema.  Psychiatric: She has a normal mood and affect.   LABORATORY PANEL:  Female CBC Recent Labs  Lab 01/26/18 0517  WBC 12.8*  HGB 13.3  HCT 38.0  PLT 114*   ------------------------------------------------------------------------------------------------------------------ Chemistries  Recent Labs  Lab 01/23/18 2217  01/27/18 0741 01/28/18 0716  NA 138   < > 144 140  K 2.6*   < > 3.7 4.5  CL 97*   < > 106 110  CO2 9*   < > 32 27  GLUCOSE 1,064*   < > 155* 338*  BUN 72*   < > 19 19  CREATININE 2.18*   < > 0.33* 0.59  CALCIUM 9.2   < > 8.5* 8.2*  MG  --    < > 2.2  --   AST 17  --   --   --   ALT 20  --   --   --   ALKPHOS 82  --   --   --   BILITOT 2.2*  --   --   --    < > = values in this interval not displayed.   RADIOLOGY:  No results found. ASSESSMENT AND PLAN:   DKA (diabetic ketoacidoses)  Improved with IV insulin and IV fluid support.  Hyperglycemia due to diabetes 2. Uncontrolled,BG  more than 300;adjust lantus  Hypokalemia.   improved with supplements.    Hypernatremia.  Improved.    AKI (acute kidney injury) (HCC) -related to DKA as above,, improved with IV fluid support.  UTI;started on Rocephin.follow urine cultures.  Acute metabolic encephalopathy due to above.  Improved.   HTN (hypertension), resumed lisinopril and HCTZ.  Discontinued HCTZ due to hypokalemia.  Started Norvasc.   Narcotic abuse and withdrawal.  Diarrhea.  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  Imodium as needed.  Seen by Dr. Toni Amend from psychiatry .  Patient told  him that she cannot afford methadone or Suboxone .started on robaxin,amytriptyline and immodium.   Tobacco abuse.  Smoking cessation was counseled for 4 minutes.  Generalized weakness.  Physical therapy today  recommended home health physical therapy  Unable To discharge patient today because patient has hight BG and UTI.Marland Kitchen  All the records are reviewed and case discussed with Care Management/Social Worker. Management plans discussed with the patient, family and they are in agreement.  CODE STATUS: Full Code  TOTAL TIME TAKING CARE OF THIS PATIENT: 38 minutes.   More than 50% of the time was spent in counseling/coordination of care: YES  POSSIBLE D/C IN 2 DAYS, DEPENDING ON CLINICAL CONDITION.   Katha Hamming M.D on 01/28/2018 at 3:25 PM  Between 7am to 6pm - Pager - [phone]  After 6pm go to [email or web address] - Therapist, nutritional Hospitalists

## 2018-01-28 NOTE — Care Management (Addendum)
Care Management consulted by MD for a RW and cab voucher for possible discharge planning. Pateint currently lives at a hotel with a young man "who is like her adopted son". She has had many issues with substance abuse and withdrawal. She obtains medications from [facility] for free. PCP is Phineas Real. Patient denies need for home health right now since she is in a small hotel room. Patient does not work and has limited transport. Will seek a charity walker at time of discharge if needed and assist with transport as needed. Buddy Duty RN BSN RNCM [phone]

## 2018-01-28 NOTE — Progress Notes (Signed)
Patient c/o lower abdominal pressure and cramping.  She says it was painful to void.  It felt like she had just had a foley catheter removed.  Dr. Luberta Mutter informed.

## 2018-01-28 NOTE — Progress Notes (Signed)
Likely discharge later today if the UA has no infection, discharged home with home health, rolling walker.  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  Seen by Dr. Toni Amend, started on Robaxin and also amitriptyline, meloxicam continue Neurontin, use Zofran for nausea and Imodium for diarrhea.  Patient is worried about how she feels when she is discharged, patient says her psychiatrist that she cannot afford methadone.  She received Suboxone in the hospital for 3 days.  He may come for opiate withdrawal I told her that her chronic pain will take long time before she gets better.  Time spent;20 min

## 2018-01-28 NOTE — Progress Notes (Signed)
Pharmacy Electrolyte Monitoring Consult:  Pharmacy consulted to assist in monitoring and replacing electrolytes in this 52 y.o. female admitted on 01/23/2018 with DKA.    Labs:  Sodium (mmol/L)  Date Value  01/28/2018 140  03/29/2014 142   Potassium (mmol/L)  Date Value  01/28/2018 4.5  03/29/2014 3.4 (L)   Magnesium (mg/dL)  Date Value  41/32/4401 2.2  03/29/2014 1.6 (L)   Phosphorus (mg/dL)  Date Value  02/72/5366 3.4   Calcium (mg/dL)  Date Value  44/09/4740 8.2 (L)   Calcium, Total (mg/dL)  Date Value  59/56/3875 8.5   Albumin (g/dL)  Date Value  64/33/2951 3.8  03/29/2014 3.2 (L)    Assessment/Plan:   Electrolytes WNL. No supplementation is warranted for today.  Recheck with am labs.   Pharmacy will continue to monitor and adjust per consult.   Stormy Card [facility] Clinical Pharmacist 01/28/2018 9:48 AM

## 2018-01-29 LAB — CULTURE, BLOOD (ROUTINE X 2)
CULTURE: NO GROWTH
Culture: NO GROWTH
Special Requests: ADEQUATE

## 2018-01-29 LAB — BASIC METABOLIC PANEL
ANION GAP: 5 (ref 5–15)
BUN: 21 mg/dL — ABNORMAL HIGH (ref 6–20)
CALCIUM: 8.7 mg/dL — AB (ref 8.9–10.3)
CHLORIDE: 108 mmol/L (ref 98–111)
CO2: 27 mmol/L (ref 22–32)
Creatinine, Ser: 0.51 mg/dL (ref 0.44–1.00)
GFR calc non Af Amer: >60 mL/min (ref >60)
Glucose, Bld: 346 mg/dL — ABNORMAL HIGH (ref 70–99)
Potassium: 5 mmol/L (ref 3.5–5.1)
Sodium: 140 mmol/L (ref 135–145)

## 2018-01-29 LAB — BLOOD GAS, ARTERIAL
Bicarbonate: UNDETERMINED mmol/L (ref 20.0–28.0)
FIO2: 0.21
O2 SAT: UNDETERMINED %
PATIENT TEMPERATURE: 37
PCO2 ART: 19 mmHg — AB (ref 32.0–48.0)
PO2 ART: 89 mmHg (ref 83.0–108.0)
pH, Arterial: 7.29 — ABNORMAL LOW (ref 7.350–7.450)

## 2018-01-29 LAB — URINE CULTURE

## 2018-01-29 LAB — GLUCOSE, CAPILLARY
GLUCOSE-CAPILLARY: 387 mg/dL — AB (ref 70–99)
Glucose-Capillary: 313 mg/dL — ABNORMAL HIGH (ref 70–99)
Glucose-Capillary: 262 mg/dL — ABNORMAL HIGH (ref 70–99)
Glucose-Capillary: 324 mg/dL — ABNORMAL HIGH (ref 70–99)

## 2018-01-29 MED ORDER — INSULIN GLARGINE 100 UNIT/ML ~~LOC~~ SOLN
17.0000 [IU] | Freq: Every day | SUBCUTANEOUS | Status: DC
  Filled 2018-01-29: qty 0.17

## 2018-01-29 MED ORDER — AMLODIPINE BESYLATE 5 MG PO TABS
2.5000 mg | ORAL_TABLET | Freq: Every day | ORAL | Status: DC
  Administered 2018-01-31: 09:00:00 2.5 mg via ORAL
  Filled 2018-01-29 (×2): qty 1

## 2018-01-29 MED ORDER — IBUPROFEN 400 MG PO TABS
600.0000 mg | ORAL_TABLET | Freq: Three times a day (TID) | ORAL | Status: DC | PRN
  Administered 2018-01-30 – 2018-01-31 (×3): 600 mg via ORAL
  Filled 2018-01-29 (×3): qty 2

## 2018-01-29 MED ORDER — ENOXAPARIN SODIUM 40 MG/0.4ML ~~LOC~~ SOLN
40.0000 mg | SUBCUTANEOUS | Status: DC
  Administered 2018-01-29 – 2018-01-30 (×2): 40 mg via SUBCUTANEOUS
  Filled 2018-01-29 (×2): qty 0.4

## 2018-01-29 MED ORDER — INSULIN GLARGINE 100 UNIT/ML ~~LOC~~ SOLN
25.0000 [IU] | Freq: Every day | SUBCUTANEOUS | Status: DC
  Filled 2018-01-29: qty 0.25

## 2018-01-29 MED ORDER — INSULIN GLARGINE 100 UNIT/ML ~~LOC~~ SOLN
25.0000 [IU] | Freq: Every day | SUBCUTANEOUS | Status: DC
  Administered 2018-01-29: 25 [IU] via SUBCUTANEOUS
  Filled 2018-01-29 (×2): qty 0.25

## 2018-01-29 MED ORDER — DULOXETINE HCL 30 MG PO CPEP
30.0000 mg | ORAL_CAPSULE | Freq: Every day | ORAL | Status: DC
  Administered 2018-01-29 – 2018-01-31 (×3): 30 mg via ORAL
  Filled 2018-01-29 (×3): qty 1

## 2018-01-29 MED ORDER — INSULIN ASPART 100 UNIT/ML ~~LOC~~ SOLN
4.0000 [IU] | Freq: Three times a day (TID) | SUBCUTANEOUS | Status: DC
  Administered 2018-01-29 – 2018-01-30 (×4): 4 [IU] via SUBCUTANEOUS
  Filled 2018-01-29 (×4): qty 1

## 2018-01-29 NOTE — Progress Notes (Signed)
Pt has no cardiac monitoring anymore, and no reason for telemetry, Dr. Enedina Finner, gave orders to transfer to med surg floor. Bed request placed. Shirley Friar, RN, BSN

## 2018-01-29 NOTE — Progress Notes (Signed)
Pt to be transferred to 1C Room 110. Receiving RN Tresa Endo given report and all questions answered. Nighttime meds given. Pt notified of transfer and all questions/concerns addressed. Lamonte Richer, RN

## 2018-01-29 NOTE — Progress Notes (Signed)
Occupational Therapy Treatment Patient Details Name: Karen Lindsey MRN: [id number] DOB: 29-Dec-1965 Today's Date: 01/29/2018    History of present illness Pt is a 52 year old female who was admitted to [facility] for DKA following AMS.  PMH includes RA, narcotic addiction, DM, Htn, fatty liver and neuropathy.   OT comments  Pt seen for OT tx this date. Session focused on cognitive behavioral strategies for improved self mgt of chronic pain, stress mgt skills, and sleep hygiene to maximize pt's safety and independence with mobility, ADL, and IADL. Please see below for additional detail. Pt verbalized understanding of education/training provided. Pt reports being anxious about discharge planning and what she will do as she is going through withdrawal. Pt fearful of being alone through this process. The "adopted son" she lives with works during the day so she will be alone from 7:30am to 3pm at least each day. Pt continues to benefit from skilled OT services to maximize pt's return to PLOF and minimize risk of falls/injury. Recommend HHOT services and supervision assist to support pt upon discharge.   Follow Up Recommendations  Home health OT;Supervision - Intermittent    Equipment Recommendations  None recommended by OT    Recommendations for Other Services      Precautions / Restrictions Precautions Precautions: Fall Restrictions Weight Bearing Restrictions: No       Mobility   Balance    ADL either performed or assessed with clinical judgement   ADL                                               Vision Baseline Vision/History: No visual deficits Patient Visual Report: No change from baseline     Perception     Praxis      Cognition Arousal/Alertness: Awake/alert Behavior During Therapy: WFL for tasks assessed/performed Overall Cognitive Status: Within Functional Limits for tasks assessed                                 General Comments:  Follows commands inconsistently. Alert to self.        Exercises Other Exercises Other Exercises: Pt educated in cognitive behavioral pain coping strategies to support self mgt of her chronic pain and maximize safety Other Exercises: Pt educated in sleep hygiene strategies to improve sleep, as pt notes this is difficult for her Other Exercises: Pt educated in stress mgt strategies to support her participation in ADL, mobility, and meaningful occupations   Shoulder Instructions       General Comments      Pertinent Vitals/ Pain       Pain Assessment: 0-10 Pain Score: 8  Faces Pain Scale: Hurts little more Pain Location: chronic lower back pain, BLE, feet Pain Descriptors / Indicators: Aching;Grimacing Pain Intervention(s): Limited activity within patient's tolerance;Monitored during session;Repositioned;Utilized relaxation techniques  Home Living                                          Prior Functioning/Environment              Frequency  Min 1X/week        Progress Toward Goals  OT Goals(current goals can now be found in the  care plan section)  Progress towards OT goals: Progressing toward goals  Acute Rehab OT Goals Patient Stated Goal: To regain independence, and get stronger. OT Goal Formulation: With patient Potential to Achieve Goals: Good  Plan Discharge plan needs to be updated;Frequency remains appropriate    Co-evaluation                 AM-PAC PT "6 Clicks" Daily Activity     Outcome Measure   Help from another person eating meals?: None Help from another person taking care of personal grooming?: None Help from another person toileting, which includes using toliet, bedpan, or urinal?: A Little Help from another person bathing (including washing, rinsing, drying)?: A Little Help from another person to put on and taking off regular upper body clothing?: None Help from another person to put on and taking off regular lower  body clothing?: A Little 6 Click Score: 21    End of Session    OT Visit Diagnosis: Muscle weakness (generalized) (M62.81)   Activity Tolerance Patient tolerated treatment well   Patient Left in bed;with call bell/phone within reach;with bed alarm set   Nurse Communication          Time: 3536-1443 OT Time Calculation (min): 35 min  Charges: OT General Charges $OT Visit: 1 Visit OT Treatments $Therapeutic Activity: 23-37 mins  Richrd Prime, MPH, MS, OTR/L ascom [phone] 01/29/18, 4:56 PM

## 2018-01-29 NOTE — Progress Notes (Signed)
Physical Therapy Treatment Patient Details Name: Karen Lindsey MRN: [id number] DOB: 1966/05/31 Today's Date: 01/29/2018    History of Present Illness Pt is a 52 year old female who was admitted to [facility] for DKA following AMS.  PMH includes RA, narcotic addiction, DM, Htn, fatty liver and neuropathy.    PT Comments    Patient requires cues and supervision for mobility in bed. Patient limited by abdominal pain, fatigue, and reports of muscle cramping while ambulating. Patient will continue to benefit from PT while here in the hospital to improve independence with functional mobility.     Follow Up Recommendations  Home health PT;Supervision for mobility/OOB     Equipment Recommendations  Rolling walker with 5" wheels    Recommendations for Other Services       Precautions / Restrictions Precautions Precautions: Fall Restrictions Weight Bearing Restrictions: No    Mobility  Bed Mobility Overal bed mobility: Modified Independent Bed Mobility: Supine to Sit;Sit to Supine     Supine to sit: Min guard Sit to supine: Min guard   General bed mobility comments: requires assistance for straightening covers, adjusting bed, cues for proper positioning.  Transfers Overall transfer level: Modified independent Equipment used: Rolling walker (2 wheeled) Transfers: Sit to/from Stand Sit to Stand: Modified independent (Device/Increase time)         General transfer comment: patient requires increased time and effort due to pain in abdomen with movement.  Ambulation/Gait Ambulation/Gait assistance: Modified independent (Device/Increase time);Min guard Gait Distance (Feet): 50 Feet Assistive device: Rolling walker (2 wheeled) Gait Pattern/deviations: Step-to pattern Gait velocity: decreased   General Gait Details: patient requires increased time due to reports of pain and cramping. Stops every few feet. Requests to turn around after 25 feet.    Stairs              Wheelchair Mobility    Modified Rankin (Stroke Patients Only)       Balance Overall balance assessment: Mild deficits observed, not formally tested Sitting-balance support: Bilateral upper extremity supported   Sitting balance - Comments: Remains flexed in posture when sitting.   Standing balance support: Bilateral upper extremity supported Standing balance-Leahy Scale: Fair Standing balance comment: required increased assist from B UEs this day                            Cognition Arousal/Alertness: Awake/alert Behavior During Therapy: WFL for tasks assessed/performed Overall Cognitive Status: Within Functional Limits for tasks assessed                                 General Comments: Follows commands inconsistently. Alert to self.      Exercises Other Exercises Other Exercises: ap, heel slides, hip abd/add, slr x 5-10 reps to tolerance. Increased difficulty with increased reps.    General Comments        Pertinent Vitals/Pain Pain Assessment: 0-10 Pain Score: 4  Faces Pain Scale: Hurts little more Pain Location: Patient has abdominal pain with movement/exercise Pain Descriptors / Indicators: Aching;Grimacing Pain Intervention(s): Limited activity within patient's tolerance;Monitored during session    Home Living                      Prior Function            PT Goals (current goals can now be found in the care plan section)  Frequency    Min 2X/week      PT Plan Current plan remains appropriate    Co-evaluation              AM-PAC PT "6 Clicks" Daily Activity  Outcome Measure  Difficulty turning over in bed (including adjusting bedclothes, sheets and blankets)?: None Difficulty moving from lying on back to sitting on the side of the bed? : None Difficulty sitting down on and standing up from a chair with arms (e.g., wheelchair, bedside commode, etc,.)?: Unable Help needed moving to and from a  bed to chair (including a wheelchair)?: A Little Help needed walking in hospital room?: A Little Help needed climbing 3-5 steps with a railing? : A Lot 6 Click Score: 17    End of Session Equipment Utilized During Treatment: Gait belt Activity Tolerance: Patient limited by fatigue;Patient limited by lethargy Patient left: with bed alarm set;in bed;with call bell/phone within reach Nurse Communication: Mobility status PT Visit Diagnosis: Other abnormalities of gait and mobility (R26.89);Difficulty in walking, not elsewhere classified (R26.2);Muscle weakness (generalized) (M62.81)     Time: 1130-1147 PT Time Calculation (min) (ACUTE ONLY): 17 min  Charges:  $Gait Training: 8-22 mins            Jimmylee Ratterree, PT, GCS 01/29/18,2:48 PM

## 2018-01-29 NOTE — Progress Notes (Signed)
Inpatient Diabetes Program Recommendations  AACE/ADA: New Consensus Statement on Inpatient Glycemic Control (2019)  Target Ranges:  Prepandial:   less than 140 mg/dL      Peak postprandial:   less than 180 mg/dL (1-2 hours)      Critically ill patients:  140 - 180 mg/dL   Results for LADASHA, SCHNACKENBERG (MRN [id number]) as of 01/29/2018 08:41  Ref. Range 01/28/2018 08:17 01/28/2018 11:37 01/28/2018 17:09 01/28/2018 20:55 01/29/2018 08:10  Glucose-Capillary Latest Ref Range: 70 - 99 mg/dL 086 (H) 578 (H) 469 (H) 295 (H) 313 (H)   Review of Glycemic Control Diabetes history: DM2 Outpatient Diabetes medications: Invokana 100 mg daily, 70/30 32 units BID, Metformin 500 mg QPM (Not taking Metformin due to GI intolerance) Current orders for Inpatient glycemic control: Lantus 17 units QHS, Novolog 0-20 units TID with meals, Novolog 0-5 units QHS  Inpatient Diabetes Program Recommendations:  Insulin - Basal: Please consider increasing Lantus to 25 units QHS. Insulin - Meal Coverage: Please consider ordering Novolog 4 units TID with meals for meal coverage if patient eats at least 50% of meals. HgbA1C: A1C 11.4% on 11/08/17 indicating an average glucose of 280 mg/dl over the past 2-3 months.  Inpatient Diabetes Coordinator spoke with patient on 01/26/18 regarding DM control.  Thanks, Orlando Penner, RN, MSN, CDE Diabetes Coordinator Inpatient Diabetes Program [phone] (Team Pager from 8am to 5pm)

## 2018-01-29 NOTE — Progress Notes (Signed)
Pharmacy Electrolyte Monitoring Consult:  Pharmacy consulted to assist in monitoring and replacing electrolytes in this 52 y.o. female admitted on 01/23/2018 with DKA.    Labs:  Sodium (mmol/L)  Date Value  01/29/2018 140  03/29/2014 142   Potassium (mmol/L)  Date Value  01/29/2018 5.0  03/29/2014 3.4 (L)   Magnesium (mg/dL)  Date Value  96/29/5284 2.2  03/29/2014 1.6 (L)   Phosphorus (mg/dL)  Date Value  13/24/4010 3.4   Calcium (mg/dL)  Date Value  27/25/3664 8.7 (L)   Calcium, Total (mg/dL)  Date Value  40/34/7425 8.5   Albumin (g/dL)  Date Value  95/63/8756 3.8  03/29/2014 3.2 (L)    Assessment/Plan:   Electrolytes WNL. No supplementation is warranted for today.  Recheck with am labs.   01/29/18 04:05 K 5, Ca 8.7. Magnesium and phosphorus not assessed.    Pharmacy will continue to monitor and adjust per consult. Patient had been on Klor-Con 40 mEq po BID. I discontinued the Klor-Con order. Will recheck all electrolytes tomorrow with AM labs.   Carola Frost, [facility] Clinical Pharmacist 01/29/2018 7:11 AM

## 2018-01-29 NOTE — Progress Notes (Signed)
[facility] - [place] at [facility]   PATIENT NAME: Karen Lindsey    MR#:  [id number]  DATE OF BIRTH:  1965-07-17  SUBJECTIve' complains of dysuria,, still has been having pain related problems or neuropathy.  CHIEF COMPLAINT:   Chief Complaint  Patient presents with  . Altered Mental Status   Started on Rocephin for UTI.  Blood sugars. more than 300.  REVIEW OF SYSTEMS:  Review of Systems  Constitutional: Positive for malaise/fatigue. Negative for chills and fever.  HENT: Negative for sore throat.   Eyes: Negative for blurred vision and double vision.  Respiratory: Negative for cough, hemoptysis, shortness of breath, wheezing and stridor.   Cardiovascular: Negative for chest pain, palpitations, orthopnea and leg swelling.  Gastrointestinal: Negative for abdominal pain, blood in stool, constipation, diarrhea, melena, nausea and vomiting.  Genitourinary: Positive for dysuria. Negative for flank pain and hematuria.  Musculoskeletal: Negative for back pain and joint pain.  Neurological: Negative for dizziness, sensory change, focal weakness, seizures, loss of consciousness, weakness and headaches.  Endo/Heme/Allergies: Negative for polydipsia.  Psychiatric/Behavioral: Negative for depression. The patient is not nervous/anxious.     DRUG ALLERGIES:   Allergies  Allergen Reactions  . Ultram [Tramadol Hcl] Palpitations   VITALS:  Blood pressure 97/66, pulse 82, temperature 97.7 F (36.5 C), temperature source Oral, resp. rate 14, height 5\' 1"  (1.549 m), weight 54.9 kg (121 lb), SpO2 96 %. PHYSICAL EXAMINATION:  Physical Exam  Constitutional: She is oriented to person, place, and time. She appears well-developed. No distress.  HENT:  Head: Normocephalic.  Eyes: Pupils are equal, round, and reactive to light. Conjunctivae are normal. No scleral icterus.  Neck: Neck supple. No JVD present. No tracheal deviation present.  Cardiovascular: Normal rate, regular  rhythm and normal heart sounds. Exam reveals no gallop.  No murmur heard. Pulmonary/Chest: Effort normal and breath sounds normal. No respiratory distress. She has no wheezes. She has no rales.  Abdominal: Soft. Bowel sounds are normal. She exhibits no distension. There is no tenderness. There is no rebound.  Musculoskeletal: She exhibits no edema or tenderness.  Neurological: She is alert and oriented to person, place, and time. No cranial nerve deficit.  Skin: No rash noted. No erythema.  Psychiatric: She has a normal mood and affect.   LABORATORY PANEL:  Female CBC Recent Labs  Lab 01/26/18 0517  WBC 12.8*  HGB 13.3  HCT 38.0  PLT 114*   ------------------------------------------------------------------------------------------------------------------ Chemistries  Recent Labs  Lab 01/23/18 2217  01/27/18 0741  01/29/18 0405  NA 138   < > 144   < > 140  K 2.6*   < > 3.7   < > 5.0  CL 97*   < > 106   < > 108  CO2 9*   < > 32   < > 27  GLUCOSE 1,064*   < > 155*   < > 346*  BUN 72*   < > 19   < > 21*  CREATININE 2.18*   < > 0.33*   < > 0.51  CALCIUM 9.2   < > 8.5*   < > 8.7*  MG  --    < > 2.2  --   --   AST 17  --   --   --   --   ALT 20  --   --   --   --   ALKPHOS 82  --   --   --   --  BILITOT 2.2*  --   --   --   --    < > = values in this interval not displayed.   RADIOLOGY:  No results found. ASSESSMENT AND PLAN:   DKA (diabetic ketoacidoses)  Improved with IV insulin and IV fluid support.   Hyperglycemia due to diabetes 2. Uncontrolled,BG more than 300;adjusted lantus, Lantus 25 units at night now, added mealtime coverage also.  If the blood glucose is controlled better than today, discharge home am.   Hypokalemia.   improved with supplements.    Hypernatremia.  Improved.    AKI (acute kidney injury) (HCC) -related to DKA as above,, improved with IV fluid support.  UTI;started on Rocephin.follow urine cultures.  Acute metabolic encephalopathy due to  above.  Improved.   HTN (hypertension), ; hypotensive today, because the dose of Norvasc,   Narcotic abuse and withdrawal.  Diarrhea.  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  Imodium as needed.  Seen by Dr. Toni Amend from psychiatry .  Patient told him that she cannot afford methadone or Suboxone .started on robaxin,amytriptyline and immodium.  Cannot give any narcotics.  Patient can continue Neurontin for neuropathy.  Patient still complains of pain related  Problems..  Can discuss with Dr. Toni Amend chronic pain medication needs.  Patient needs referral for outpatient pain management.   Tobacco abuse.  Smoking cessation was counseled for 4 minutes.  Generalized weakness.  Physical therapy today  recommended home health physical therapy  Unable To discharge patient today because patient has hight BG and UTI.Marland Kitchen  All the records are reviewed and case discussed with Care Management/Social Worker. Management plans discussed with the patient, family and they are in agreement.  CODE STATUS: Full Code  TOTAL TIME TAKING CARE OF THIS PATIENT: 38 minutes.   More than 50% of the time was spent in counseling/coordination of care: YES  POSSIBLE D/C IN 2 DAYS, DEPENDING ON CLINICAL CONDITION.   Katha Hamming M.D on 01/29/2018 at 1:32 PM  Between 7am to 6pm - Pager - [phone]  After 6pm go to [email or web address] - Therapist, nutritional Hospitalists

## 2018-01-30 LAB — GLUCOSE, CAPILLARY
GLUCOSE-CAPILLARY: 255 mg/dL — AB (ref 70–99)
Glucose-Capillary: 327 mg/dL — ABNORMAL HIGH (ref 70–99)
Glucose-Capillary: 308 mg/dL — ABNORMAL HIGH (ref 70–99)
Glucose-Capillary: 235 mg/dL — ABNORMAL HIGH (ref 70–99)

## 2018-01-30 LAB — BASIC METABOLIC PANEL
Anion gap: 4 — ABNORMAL LOW (ref 5–15)
BUN: 27 mg/dL — AB (ref 6–20)
CALCIUM: 8.5 mg/dL — AB (ref 8.9–10.3)
CO2: 28 mmol/L (ref 22–32)
CREATININE: 0.64 mg/dL (ref 0.44–1.00)
Chloride: 104 mmol/L (ref 98–111)
GFR calc Af Amer: >60 mL/min (ref >60)
GFR calc non Af Amer: >60 mL/min (ref >60)
Glucose, Bld: 452 mg/dL — ABNORMAL HIGH (ref 70–99)
Potassium: 4.8 mmol/L (ref 3.5–5.1)
SODIUM: 136 mmol/L (ref 135–145)

## 2018-01-30 LAB — MAGNESIUM: MAGNESIUM: 2.3 mg/dL (ref 1.7–2.4)

## 2018-01-30 LAB — PHOSPHORUS: Phosphorus: 4.1 mg/dL (ref 2.5–4.6)

## 2018-01-30 MED ORDER — INSULIN ASPART PROT & ASPART (70-30 MIX) 100 UNIT/ML ~~LOC~~ SUSP
25.0000 [IU] | Freq: Two times a day (BID) | SUBCUTANEOUS | Status: DC
  Administered 2018-01-30: 10:00:00 25 [IU] via SUBCUTANEOUS
  Filled 2018-01-30: qty 10

## 2018-01-30 MED ORDER — INSULIN ASPART PROT & ASPART (70-30 MIX) 100 UNIT/ML ~~LOC~~ SUSP: 25.0000 [IU] | Freq: Two times a day (BID) | SUBCUTANEOUS | Status: DC

## 2018-01-30 MED ORDER — INSULIN ASPART PROT & ASPART (70-30 MIX) 100 UNIT/ML ~~LOC~~ SUSP
35.0000 [IU] | Freq: Two times a day (BID) | SUBCUTANEOUS | Status: DC
  Administered 2018-01-30 – 2018-01-31 (×2): 35 [IU] via SUBCUTANEOUS
  Filled 2018-01-30 (×2): qty 10

## 2018-01-30 MED ORDER — CEPHALEXIN 500 MG PO CAPS
500.0000 mg | ORAL_CAPSULE | Freq: Two times a day (BID) | ORAL | Status: DC
  Administered 2018-01-30 – 2018-01-31 (×3): 500 mg via ORAL
  Filled 2018-01-30 (×3): qty 1

## 2018-01-30 NOTE — Care Management (Signed)
Spoke with Dr. Teressa Senter. Possible discharge to home today. Discussed discharge information with Ms. Minnifield at the bedside.States she last seen Dr. Letta Pate at [facility] about 2 weeks ago. States she has to pay a co-pay at [employer].  Goes to [facility] and gets charity help with medications when she has transportation.  Lives at [facility]. No insurance listed. Application given for [facility] States friend Jeronimo Norma will transport. Rolling walker from [facility]. Wants no other services in the home. Telephone# [phone] Will send referral to [facility] Gwenette Greet RN MSN CCM Care Management [phone]

## 2018-01-30 NOTE — Progress Notes (Addendum)
Inpatient Diabetes Program Recommendations  AACE/ADA: New Consensus Statement on Inpatient Glycemic Control (2015)  Target Ranges:  Prepandial:   less than 140 mg/dL      Peak postprandial:   less than 180 mg/dL (1-2 hours)      Critically ill patients:  140 - 180 mg/dL   Results for Karen Lindsey, Karen Lindsey (MRN [id number]) as of 01/30/2018 08:25  Ref. Range 01/29/2018 08:10 01/29/2018 11:44 01/29/2018 16:40 01/29/2018 20:55 01/30/2018 07:44  Glucose-Capillary Latest Ref Range: 70 - 99 mg/dL 149 (H) 702 (H) 637 (H) 324 (H) 327 (H)   Review of Glycemic Control Diabetes history:DM2 Outpatient Diabetes medications: Invokana 100 mg daily, 70/30 32 units BID, Metformin 500 mg QPM(Not taking Metformin due to GI intolerance) Current orders for Inpatient glycemic control:Lantus 25 units QHS,Novolog 0-20units TID with meals, Novolog 0-5 units QHS, Novolog 4 units TID with meals  Inpatient Diabetes Program Recommendations:  Insulin - Basal: Please consider discontinuing Lantus and ordering 70/30 25 units BID. Insulin - Meal Coverage: If 70/30 is ordered as recommended, please discontinue Novolog 4 units TID which is for meal coverage since 70/30 would provide insulin for meal coverage. HgbA1C: A1C 11.4% on 11/08/17 indicating an average glucose of 280 mg/dl over the past 2-3 months.  Inpatient Diabetes Coordinator spoke with patient on 01/26/18 regarding DM control.  Addendum 01/30/18@1 :17: Noted noon CBG 308 mg/dl. Recommend increasing 70/30 to 35 units BID and discontinuing Novolog 4 units TID with meals. Sent page to Dr. Luberta Mutter at 1:20.   Thanks, Orlando Penner, RN, MSN, CDE Diabetes Coordinator Inpatient Diabetes Program [phone] (Team Pager from 8am to 5pm)

## 2018-01-30 NOTE — Plan of Care (Signed)
  Problem: Education: Goal: Knowledge of General Education information will improve Outcome: Progressing   Problem: Clinical Measurements: Goal: Ability to maintain clinical measurements within normal limits will improve Outcome: Progressing Goal: Will remain free from infection Outcome: Progressing Goal: Diagnostic test results will improve Outcome: Progressing Goal: Respiratory complications will improve Outcome: Progressing Goal: Cardiovascular complication will be avoided Outcome: Progressing   Problem: Activity: Goal: Risk for activity intolerance will decrease Outcome: Progressing   Problem: Nutrition: Goal: Adequate nutrition will be maintained Outcome: Progressing   Problem: Coping: Goal: Level of anxiety will decrease Outcome: Progressing   Problem: Elimination: Goal: Will not experience complications related to bowel motility Outcome: Progressing Goal: Will not experience complications related to urinary retention Outcome: Progressing   Problem: Pain Managment: Goal: General experience of comfort will improve Outcome: Progressing   Problem: Safety: Goal: Ability to remain free from injury will improve Outcome: Progressing   Problem: Skin Integrity: Goal: Risk for impaired skin integrity will decrease Outcome: Progressing   

## 2018-01-30 NOTE — Progress Notes (Signed)
Pharmacy Electrolyte Monitoring Consult:  Pharmacy consulted to assist in monitoring and replacing electrolytes in this 52 y.o. female admitted on 01/23/2018 with DKA.    Labs:  Sodium (mmol/L)  Date Value  01/30/2018 136  03/29/2014 142   Potassium (mmol/L)  Date Value  01/30/2018 4.8  03/29/2014 3.4 (L)   Magnesium (mg/dL)  Date Value  11/94/1740 2.3  03/29/2014 1.6 (L)   Phosphorus (mg/dL)  Date Value  81/44/8185 4.1   Calcium (mg/dL)  Date Value  63/14/9702 8.5 (L)   Calcium, Total (mg/dL)  Date Value  63/78/5885 8.5   Albumin (g/dL)  Date Value  02/77/4128 3.8  03/29/2014 3.2 (L)    Assessment/Plan:   Electrolytes WNL. No supplementation is warranted for today.  Recheck with am labs.   01/29/18 04:05 K 5, Ca 8.7. Magnesium and phosphorus not assessed. Patient had been on Klor-Con 40 mEq po BID. I discontinued the Klor-Con order. Will recheck all electrolytes tomorrow with AM labs.   01/30/18 04:46 K 4.8, Ca 8.5, Mg 2.3, Phos 4.1. No additional supplement indicated at this time. Will recheck all electrolytes with AM labs in 2 days.  Pharmacy will continue to monitor and adjust per consult.  Carola Frost, [facility] Clinical Pharmacist 01/30/2018 7:42 AM

## 2018-01-30 NOTE — Plan of Care (Signed)
  Problem: Education: Goal: Knowledge of General Education information will improve Outcome: Progressing   Problem: Health Behavior/Discharge Planning: Goal: Ability to manage health-related needs will improve Outcome: Progressing   Problem: Clinical Measurements: Goal: Ability to maintain clinical measurements within normal limits will improve Outcome: Progressing   Problem: Elimination: Goal: Will not experience complications related to urinary retention Outcome: Progressing   Problem: Safety: Goal: Ability to remain free from injury will improve Outcome: Progressing   Problem: Skin Integrity: Goal: Risk for impaired skin integrity will decrease Outcome: Progressing

## 2018-01-30 NOTE — Progress Notes (Addendum)
[facility] - [place] at [facility]   PATIENT NAME: Karen Lindsey    MR#:  [id number]  DATE OF BIRTH:  1966/05/19  Patient does have some abdominal pain but no other complaints she appears better than last 2 days.  CHIEF COMPLAINT:   Chief Complaint  Patient presents with  . Altered Mental Status   Started on Rocephin for UTI.  Blood sugars. more than 300.  REVIEW OF SYSTEMS:  Review of Systems  Constitutional: Positive for malaise/fatigue. Negative for chills and fever.  HENT: Negative for sore throat.   Eyes: Negative for blurred vision and double vision.  Respiratory: Negative for cough, hemoptysis, shortness of breath, wheezing and stridor.   Cardiovascular: Negative for chest pain, palpitations, orthopnea and leg swelling.  Gastrointestinal: Negative for abdominal pain, blood in stool, constipation, diarrhea, melena, nausea and vomiting.  Genitourinary: Positive for dysuria. Negative for flank pain and hematuria.  Musculoskeletal: Negative for back pain and joint pain.  Neurological: Negative for dizziness, sensory change, focal weakness, seizures, loss of consciousness, weakness and headaches.  Endo/Heme/Allergies: Negative for polydipsia.  Psychiatric/Behavioral: Negative for depression. The patient is not nervous/anxious.     DRUG ALLERGIES:   Allergies  Allergen Reactions  . Ultram [Tramadol Hcl] Palpitations   VITALS:  Blood pressure 109/64, pulse 75, temperature 97.6 F (36.4 C), temperature source Oral, resp. rate 16, height 5\' 1"  (1.549 m), weight 60.1 kg (132 lb 6.4 oz), SpO2 97 %. PHYSICAL EXAMINATION:  Physical Exam  Constitutional: She is oriented to person, place, and time. She appears well-developed. No distress.  HENT:  Head: Normocephalic.  Eyes: Pupils are equal, round, and reactive to light. Conjunctivae are normal. No scleral icterus.  Neck: Neck supple. No JVD present. No tracheal deviation present.  Cardiovascular: Normal  rate, regular rhythm and normal heart sounds. Exam reveals no gallop.  No murmur heard. Pulmonary/Chest: Effort normal and breath sounds normal. No respiratory distress. She has no wheezes. She has no rales.  Abdominal: Soft. Bowel sounds are normal. She exhibits no distension. There is no tenderness. There is no rebound.  Musculoskeletal: She exhibits no edema or tenderness.  Neurological: She is alert and oriented to person, place, and time. No cranial nerve deficit.  Skin: No rash noted. No erythema.  Psychiatric: She has a normal mood and affect.   LABORATORY PANEL:  Female CBC Recent Labs  Lab 01/26/18 0517  WBC 12.8*  HGB 13.3  HCT 38.0  PLT 114*   ------------------------------------------------------------------------------------------------------------------ Chemistries  Recent Labs  Lab 01/23/18 2217  01/30/18 0446  NA 138   < > 136  K 2.6*   < > 4.8  CL 97*   < > 104  CO2 9*   < > 28  GLUCOSE 1,064*   < > 452*  BUN 72*   < > 27*  CREATININE 2.18*   < > 0.64  CALCIUM 9.2   < > 8.5*  MG  --    < > 2.3  AST 17  --   --   ALT 20  --   --   ALKPHOS 82  --   --   BILITOT 2.2*  --   --    < > = values in this interval not displayed.   RADIOLOGY:  No results found. ASSESSMENT AND PLAN:   DKA (diabetic ketoacidoses)  Improved with IV insulin and IV fluid support.   Hyperglycemia due to diabetes 2. Uncontrolled,BG more than 300;diabetes coordinatorRecommend increasing 70/30 to 35 units  BID and discontinuing Novolog 4 units TID with meals.  Patient still has elevated blood sugar up to 300 and we are trying to titrate her 70/30.  So we will plan to discharge her tomorrow morning if the blood sugar is controlled. Discontinued the Lantus because of persistently elevated blood sugar more than 300.   Hypokalemia.   improved with supplements.    Hypernatremia.  Improved.    AKI (acute kidney injury) (HCC) -related to DKA as above,, improved with IV fluid  support.  UTI;started on Rocephin.urine cultures are showing strep agalactia antibiotics are changed to Keflex.  Patient is total of 7 days of antibiotic . Acute metabolic encephalopathy due to above.  Improved.   HTN (hypertension), ; hypotension improved.  Watch closely, continue low-dose Norvasc.  ,  Narcotic abuse and withdrawal.  Diarrhea.  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  Imodium as needed.  Seen by Dr. Toni Amend from psychiatry .  Patient told him that she cannot afford methadone or Suboxone .started on robaxin,amytriptyline and immodium.  Cannot give any narcotics.  Patient can continue Neurontin for neuropathy.  Looks like her withdrawal symptoms are better today.,  Restarted Cymbalta 30 mg daily for depression.  Tobacco abuse.  Smoking cessation was counseled for 4 minutes.  Generalized weakness.  Physical therapy today  recommended home health physical therapy  Unable To discharge patient today because patient has hight BG   All the records are reviewed and case discussed with Care Management/Social Worker. Management plans discussed with the patient, family and they are in agreement.  CODE STATUS: Full Code  TOTAL TIME TAKING CARE OF THIS PATIENT: 38 minutes.   More than 50% of the time was spent in counseling/coordination of care: YES  POSSIBLE D/C IN 2 DAYS, DEPENDING ON CLINICAL CONDITION.   Katha Hamming M.D on 01/30/2018 at 1:29 PM  Between 7am to 6pm - Pager - [phone]  After 6pm go to [email or web address] - Therapist, nutritional Hospitalists

## 2018-01-31 LAB — GLUCOSE, CAPILLARY
Glucose-Capillary: 294 mg/dL — ABNORMAL HIGH (ref 70–99)
Glucose-Capillary: 274 mg/dL — ABNORMAL HIGH (ref 70–99)

## 2018-01-31 MED ORDER — LISINOPRIL 20 MG PO TABS: 20.0000 mg | ORAL_TABLET | Freq: Every day | ORAL | 0 refills | Status: DC

## 2018-01-31 MED ORDER — METHOCARBAMOL 750 MG PO TABS: 750.0000 mg | ORAL_TABLET | Freq: Three times a day (TID) | ORAL | 0 refills | Status: DC | PRN

## 2018-01-31 MED ORDER — MUPIROCIN 2 % EX OINT: TOPICAL_OINTMENT | Freq: Two times a day (BID) | CUTANEOUS | Status: DC

## 2018-01-31 MED ORDER — MUPIROCIN 2 % EX OINT: TOPICAL_OINTMENT | Freq: Two times a day (BID) | CUTANEOUS | 0 refills | Status: DC

## 2018-01-31 MED ORDER — MUPIROCIN 2 % EX OINT
TOPICAL_OINTMENT | Freq: Two times a day (BID) | CUTANEOUS | Status: DC
  Administered 2018-01-31: 1 via TOPICAL
  Administered 2018-01-31: 04:00:00 via TOPICAL
  Filled 2018-01-31: qty 22

## 2018-01-31 MED ORDER — DULOXETINE HCL 30 MG PO CPEP: 30.0000 mg | ORAL_CAPSULE | Freq: Every day | ORAL | 0 refills | Status: DC

## 2018-01-31 MED ORDER — AMLODIPINE BESYLATE 2.5 MG PO TABS: 2.5000 mg | ORAL_TABLET | Freq: Every day | ORAL | 0 refills | Status: DC

## 2018-01-31 MED ORDER — AMITRIPTYLINE HCL 10 MG PO TABS: 10.0000 mg | ORAL_TABLET | Freq: Every day | ORAL | 0 refills | Status: DC

## 2018-01-31 MED ORDER — ZOLPIDEM TARTRATE 5 MG PO TABS: 5.0000 mg | ORAL_TABLET | Freq: Every evening | ORAL | 0 refills | Status: DC | PRN

## 2018-01-31 MED ORDER — CEPHALEXIN 500 MG PO CAPS: 500.0000 mg | ORAL_CAPSULE | Freq: Two times a day (BID) | ORAL | 0 refills | Status: AC

## 2018-01-31 MED ORDER — INSULIN ASPART PROT & ASPART (70-30 MIX) 100 UNIT/ML ~~LOC~~ SUSP: 40.0000 [IU] | Freq: Two times a day (BID) | SUBCUTANEOUS | Status: DC

## 2018-01-31 MED ORDER — ONDANSETRON HCL 4 MG PO TABS: 4.0000 mg | ORAL_TABLET | Freq: Three times a day (TID) | ORAL | 0 refills | Status: DC | PRN

## 2018-01-31 MED ORDER — GABAPENTIN 400 MG PO CAPS: 1200.0000 mg | ORAL_CAPSULE | Freq: Three times a day (TID) | ORAL | 0 refills | Status: DC

## 2018-01-31 MED ORDER — INSULIN ASPART PROT & ASPART (70-30 MIX) 100 UNIT/ML ~~LOC~~ SUSP: 42.0000 [IU] | Freq: Two times a day (BID) | SUBCUTANEOUS | 11 refills | Status: DC

## 2018-01-31 NOTE — Care Management (Signed)
Prescriptions faxed to [facility]. Rolling walker delivered yesterday.   Donated glucometer kit given Shelbie Ammons RN MSN CCM Care Management [phone]

## 2018-01-31 NOTE — Discharge Summary (Signed)
Karen Lindsey, is a 52 y.o. female  DOB 06/15/1966  MRN [id number].  Admission date:  01/23/2018  Admitting Physician  Oralia Manis, MD  Discharge Date:  01/31/2018   Primary MD  Center, [facility]  Recommendations for primary care physician for things to follow:   Follow-up at [facility] in 10 days    Admission Diagnosis  Cough [R05] Hypokalemia [E87.6] Altered mental status, unspecified altered mental status type [R41.82] Diabetic ketoacidosis without coma associated with diabetes mellitus due to underlying condition (HCC) [E08.10]   Discharge Diagnosis  Cough [R05] Hypokalemia [E87.6] Altered mental status, unspecified altered mental status type [R41.82] Diabetic ketoacidosis without coma associated with diabetes mellitus due to underlying condition (HCC) [E08.10]    Principal Problem:   Acute delirium Active Problems:   DKA (diabetic ketoacidoses) (HCC)   AKI (acute kidney injury) (HCC)   HTN (hypertension)   Narcotic abuse (HCC)      Past Medical History:  Diagnosis Date  . Arthritis    rheumatoid  . Chronic pain    a. upper/mid back.  . Diabetic peripheral neuropathy (HCC)   . Fatty liver   . Hypertension   . Insulin dependent diabetes mellitus (HCC)    a. Dx ~ 2011.  . Narcotic addiction (HCC)    a. Followed in substance abuse center in [place] - on chronic methadone.  . Neuropathy   . RA (rheumatoid arthritis) (HCC)   . Tobacco abuse     Past Surgical History:  Procedure Laterality Date  . ABDOMINAL HYSTERECTOMY    . CESAREAN SECTION    . CHOLECYSTECTOMY    . KNEE SURGERY    . MANDIBLE FRACTURE SURGERY         History of present illness and  Hospital Course:     Kindly see H&P for history of present illness and admission details, please review complete Labs,  Consult reports and Test reports for all details in brief  HPI  from the history and physical done on the day of admission 52 year old female patient for AMS/DKA.  Patient admitted for DKA/and altered mental status.  An gap could not be calculated on admission   Hospital Course  #1 DKA on admission with elevated sodium of 153, metabolic acidosis with bicarb of 17, patient is admitted to intensive care unit stepdown status started on aggressive hydration, insulin drip.  Patient's DKA resolved, subsequently anion gap read as 9.  But patient continues to have severe hypoglycemia and initially required high-dose Lantus but Lantus did not help to bring down her sugars for diabetes coordinator recommended to start her on NPH insulin insulin 70/30.  And right now on NPH insulin 70/30 at 42 units twice a day.     Hypokalemia.   improved with supplements.    Hypernatremia.  Improved.  AKI (acute kidney injury) (HCC) -related to DKA as above,, improved with IV fluid support.  UTI;started on Rocephin.urine cultures are showing strep agalactia antibiotics are changed to Keflex.  Patient is total of 7 days of antibiotic . Acute metabolic encephalopathy due to above.  Improved.  HTN (hypertension), ; hypotension improved.  Watch closely, continue low-dose Norvasc.  Narcotic abuse, methadone withdrawal: Patient had diarrhea, abdominal pain, patient is seen by Dr. Toni Amend from psychiatric, patient told Dr. Toni Amend that she cannot afford methadone or Suboxone.  Patient started on Robaxin, amitriptyline, Imodium, Zofran.  Patient also started back on her Neurontin.  Patient will be discharged home with Neurontin, amitriptyline, Robaxin.  Patient is given application for [facility] and also prescriptions are printed for medication management to help with patient medicines.     Discharge Condition:stable   Follow UP  Follow-up Information    Center, [facility]. Schedule  an appointment as soon as possible for a visit in 1 week(s).   Specialty:  General Practice Contact information: [address] [place] [address] [phone]        Iran Ouch, MD .   Specialty:  Cardiology Contact information: [address] [place] [address] [phone]             Discharge Instructions  and  Discharge Medications      Allergies as of 01/31/2018      Reactions   Ultram Marcia Brash Hcl] Palpitations      Medication List    STOP taking these medications   baclofen 10 MG tablet Commonly known as:  LIORESAL   famotidine 20 MG tablet Commonly known as:  PEPCID   fluconazole 150 MG tablet Commonly known as:  DIFLUCAN   insulin NPH Human 100 UNIT/ML injection Commonly known as:  HUMULIN N   insulin NPH-regular Human (70-30) 100 UNIT/ML injection Commonly known as:  NOVOLIN 70/30 Replaced by:  insulin aspart protamine- aspart (70-30) 100 UNIT/ML injection     TAKE these medications   amitriptyline 10 MG tablet Commonly known as:  ELAVIL Take 1 tablet (10 mg total) by mouth at bedtime.   amLODipine 2.5 MG tablet Commonly known as:  NORVASC Take 1 tablet (2.5 mg total) by mouth daily. Start taking on:  02/01/2018   cephALEXin 500 MG capsule Commonly known as:  KEFLEX Take 1 capsule (500 mg total) by mouth every 12 (twelve) hours for 5 days.   DULoxetine 30 MG capsule Commonly known as:  CYMBALTA Take 30 mg by mouth daily. What changed:  Another medication with the same name was added. Make sure you understand how and when to take each.   DULoxetine 30 MG capsule Commonly known as:  CYMBALTA Take 1 capsule (30 mg total) by mouth daily. Start taking on:  02/01/2018 What changed:  You were already taking a medication with the same name, and this prescription was added. Make sure you understand how and when to take each.   gabapentin 400 MG capsule Commonly known as:  NEURONTIN Take 3  capsules (1,200 mg total) by mouth 3 (three) times daily.   hydrOXYzine 25 MG capsule Commonly known as:  VISTARIL Take 25-50 mg by mouth 3 (three) times daily as needed for anxiety.   insulin aspart protamine- aspart (70-30) 100 UNIT/ML injection Commonly known as:  NOVOLOG MIX 70/30 Inject 0.42 mLs (42 Units total) into the skin 2 (two) times daily with a meal. Replaces:  insulin NPH-regular Human (70-30) 100 UNIT/ML injection   lisinopril 20 MG tablet Commonly known as:  PRINIVIL,ZESTRIL Take 1 tablet (20 mg total) by mouth daily.   loperamide 2 MG capsule Commonly known as:  IMODIUM Take 2 capsules (4 mg total) by mouth as needed for diarrhea or loose stools.   methocarbamol 750 MG tablet Commonly known as:  ROBAXIN Take 1 tablet (750 mg total) by mouth every 8 (eight) hours as needed for muscle spasms (withdrawl pain).   mupirocin ointment 2 % Commonly known as:  BACTROBAN Apply topically 2 (two) times daily.   ondansetron 4 MG tablet Commonly known as:  ZOFRAN Take 1 tablet (4 mg total) by mouth  every 8 (eight) hours as needed for nausea or vomiting.   protein supplement shake Liqd Commonly known as:  PREMIER PROTEIN Take 325 mLs (11 oz total) by mouth 2 (two) times daily between meals.   zolpidem 5 MG tablet Commonly known as:  AMBIEN Take 1 tablet (5 mg total) by mouth at bedtime as needed for sleep.            Durable Medical Equipment  (From admission, onward)        Start     Ordered   01/28/18 1247  For home use only DME Walker rolling  Once    Question:  Patient needs a walker to treat with the following condition  Answer:  Weakness   01/28/18 1246        Diet and Activity recommendation: See Discharge Instructions above   Consults obtained - psychiatry   Major procedures and Radiology Reports - PLEASE review detailed and final reports for all details, in brief -     Dg Chest 2 View  Result Date: 01/20/2018 CLINICAL DATA:  Patient  reports onset of heart palpitations today. Reports some SOB. Denies cough or fever. Hx DM, HTN, cholecystectomy. Patient has previous hx of drug use and has been on methadone, but has not taken it in the past 3 days. Current smoker. EXAM: CHEST - 2 VIEW COMPARISON:  01/08/2018 FINDINGS: The heart size and mediastinal contours are within normal limits. Both lungs are clear. No pleural effusion or pneumothorax. The visualized skeletal structures are unremarkable. IMPRESSION: No active cardiopulmonary disease. Electronically Signed   By: Amie Portland M.D.   On: 01/20/2018 19:05   Dg Chest 2 View  Result Date: 01/08/2018 CLINICAL DATA:  53 year old female with acute chest pain. EXAM: CHEST - 2 VIEW COMPARISON:  11/08/2017 and prior exams FINDINGS: The cardiomediastinal silhouette is unremarkable. Mild chronic peribronchial thickening again noted. There is no evidence of focal airspace disease, pulmonary edema, suspicious pulmonary nodule/mass, pleural effusion, or pneumothorax. No acute bony abnormalities are identified. IMPRESSION: No active cardiopulmonary disease. Electronically Signed   By: Harmon Pier M.D.   On: 01/08/2018 20:33   Ct Head Wo Contrast  Result Date: 01/24/2018 CLINICAL DATA:  52 y/o F; episode of unresponsiveness and hyperglycemia. EXAM: CT HEAD WITHOUT CONTRAST TECHNIQUE: Contiguous axial images were obtained from the base of the skull through the vertex without intravenous contrast. COMPARISON:  11/08/2017 CT head. FINDINGS: Brain: No evidence of acute infarction, hemorrhage, hydrocephalus, extra-axial collection or mass lesion/mass effect. Stable mild chronic microvascular ischemic changes and volume loss of the brain. Vascular: Calcific atherosclerosis of carotid siphons. No hyperdense vessel identified. Skull: Normal. Negative for fracture or focal lesion. Sinuses/Orbits: No acute finding. Other: None. IMPRESSION: 1. No acute intracranial abnormality identified. 2. Stable mild chronic  microvascular ischemic changes and volume loss of the brain. Electronically Signed   By: Mitzi Hansen M.D.   On: 01/24/2018 05:42   Dg Chest Port 1 View  Result Date: 01/24/2018 CLINICAL DATA:  52 y/o F; arrived unresponsive, nonresponsive, history of drug abuse. EXAM: PORTABLE CHEST 1 VIEW COMPARISON:  01/20/2018 chest radiograph. FINDINGS: Normal cardiac silhouette. Aortic atherosclerosis with calcification. Clear lungs. No pleural effusion or pneumothorax. No acute osseous abnormality is evident. Right upper quadrant cholecystectomy surgical clips. IMPRESSION: No active disease. Electronically Signed   By: Mitzi Hansen M.D.   On: 01/24/2018 02:29    Micro Results     Recent Results (from the past 240 hour(s))  MRSA PCR Screening  Status: None   Collection Time: 01/24/18  1:27 AM  Result Value Ref Range Status   MRSA by PCR NEGATIVE NEGATIVE Final    Comment:        The GeneXpert MRSA Assay (FDA approved for NASAL specimens only), is one component of a comprehensive MRSA colonization surveillance program. It is not intended to diagnose MRSA infection nor to guide or monitor treatment for MRSA infections. Performed at [facility], [address] [place], [place] [address]   CULTURE, BLOOD (ROUTINE X 2) w Reflex to ID Panel     Status: None   Collection Time: 01/24/18 10:46 AM  Result Value Ref Range Status   Specimen Description BLOOD RIGHT [facility]  Final   Special Requests   Final    BOTTLES DRAWN AEROBIC AND ANAEROBIC Blood Culture adequate volume   Culture   Final    NO GROWTH 5 DAYS Performed at [facility], [address] [place], [place] [address]    Report Status 01/29/2018 FINAL  Final  CULTURE, BLOOD (ROUTINE X 2) w Reflex to ID Panel     Status: None   Collection Time: 01/24/18 11:49 AM  Result Value Ref Range Status   Specimen Description BLOOD RIGHT ARM  Final   Special Requests   Final    BOTTLES DRAWN AEROBIC  ONLY Blood Culture results may not be optimal due to an inadequate volume of blood received in culture bottles   Culture   Final    NO GROWTH 5 DAYS Performed at [facility], [address] [place], [place] [address]    Report Status 01/29/2018 FINAL  Final  C difficile quick scan w PCR reflex     Status: None   Collection Time: 01/25/18  2:49 PM  Result Value Ref Range Status   C Diff antigen NEGATIVE NEGATIVE Final   C Diff toxin NEGATIVE NEGATIVE Final   C Diff interpretation No C. difficile detected.  Final    Comment: Performed at [facility], [address] [place], [place] [address]  Urine Culture     Status: Abnormal   Collection Time: 01/28/18 12:34 PM  Result Value Ref Range Status   Specimen Description   Final    URINE, RANDOM Performed at [facility], [address], [place], [place] [address]    Special Requests   Final    NONE Performed at [facility], [address] [place], [place] [address]    Culture (A)  Final    >=100,000 COLONIES/mL GROUP B STREP(S.AGALACTIAE)ISOLATED TESTING AGAINST S. AGALACTIAE NOT ROUTINELY PERFORMED DUE TO PREDICTABILITY OF AMP/PEN/VAN SUSCEPTIBILITY. Performed at [facility], [address] [place], [place] [address]    Report Status 01/29/2018 FINAL  Final       Today   Subjective:   Karen Lindsey today has no headache,no chest abdominal pain,no new weakness tingling or numbness, feels much better wants to go home today.   Objective:   Blood pressure 127/61, pulse 81, temperature 98.3 F (36.8 C), temperature source Oral, resp. rate 18, height 5\' 1"  (1.549 m), weight 58.2 kg (128 lb 6.4 oz), SpO2 100 %.   Intake/Output Summary (Last 24 hours) at 01/31/2018 0932 Last data filed at 01/30/2018 1741 Gross per 24 hour  Intake 1320 ml  Output -  Net 1320 ml    Exam Awake Alert, Oriented x 3, No new F.N deficits, Normal affect [place].AT,PERRAL Supple Neck,No JVD, No cervical  lymphadenopathy appriciated.  Symmetrical Chest wall movement, Good air movement bilaterally, CTAB RRR,No Gallops,Rubs or new Murmurs, No Parasternal Heave +ve B.Sounds, Abd Soft, Non tender, No organomegaly appriciated, No rebound -guarding or rigidity. No Cyanosis, Clubbing or edema, No new Rash or bruise  Data Review   CBC w Diff:  Lab Results  Component Value Date   WBC 12.8 (H) 01/26/2018   HGB 13.3 01/26/2018   HGB 13.0 03/29/2014   HCT 38.0 01/26/2018   HCT 41.0 03/29/2014   PLT 114 (L) 01/26/2018   PLT 171 03/29/2014   LYMPHOPCT 16 01/25/2018   LYMPHOPCT 12.3 03/05/2014   MONOPCT 7 01/25/2018   MONOPCT 5.2 03/05/2014   EOSPCT 0 01/25/2018   EOSPCT 0.0 03/05/2014   BASOPCT 1 01/25/2018   BASOPCT 0.3 03/05/2014    CMP:  Lab Results  Component Value Date   NA 136 01/30/2018   NA 142 03/29/2014   K 4.8 01/30/2018   K 3.4 (L) 03/29/2014   CL 104 01/30/2018   CL 106 03/29/2014   CO2 28 01/30/2018   CO2 28 03/29/2014   BUN 27 (H) 01/30/2018   BUN 8 03/29/2014   CREATININE 0.64 01/30/2018   CREATININE 0.66 03/29/2014   PROT 6.8 01/23/2018   PROT 6.5 03/29/2014   ALBUMIN 3.8 01/23/2018   ALBUMIN 3.2 (L) 03/29/2014   BILITOT 2.2 (H) 01/23/2018   BILITOT 0.3 03/29/2014   ALKPHOS 82 01/23/2018   ALKPHOS 82 03/29/2014   AST 17 01/23/2018   AST 29 03/29/2014   ALT 20 01/23/2018   ALT 32 03/29/2014  .   Total Time in preparing paper work, data evaluation and todays exam - 35 minutes  Katha Hamming M.D on 01/31/2018 at 9:32 AM    Note: This dictation was prepared with Dragon dictation along with smaller phrase technology. Any transcriptional errors that result from this process are unintentional.

## 2018-01-31 NOTE — Plan of Care (Signed)

## 2018-01-31 NOTE — Progress Notes (Signed)
Patient discharge home with friend, discharge instructions education provided to patient. All the questions answered and concerns addressed. Patient verbalized understanding.

## 2018-01-31 NOTE — Plan of Care (Signed)
  Problem: Education: Goal: Knowledge of General Education information will improve Outcome: Progressing   Problem: Coping: Goal: Level of anxiety will decrease Outcome: Progressing   Problem: Elimination: Goal: Will not experience complications related to bowel motility Outcome: Progressing Goal: Will not experience complications related to urinary retention Outcome: Progressing   Problem: Pain Managment: Goal: General experience of comfort will improve Outcome: Progressing   Problem: Safety: Goal: Ability to remain free from injury will improve Outcome: Progressing   Problem: Skin Integrity: Goal: Risk for impaired skin integrity will decrease Outcome: Progressing

## 2018-02-16 ENCOUNTER — Emergency Department: Payer: Self-pay

## 2018-02-16 ENCOUNTER — Other Ambulatory Visit: Payer: Self-pay

## 2018-02-16 ENCOUNTER — Encounter: Payer: Self-pay | Admitting: Emergency Medicine

## 2018-02-16 ENCOUNTER — Emergency Department
Admission: EM | Admit: 2018-02-16 | Discharge: 2018-02-17 | Disposition: A | Discharge status: Home / Self Care | Payer: Self-pay | Attending: Emergency Medicine | Admitting: Emergency Medicine

## 2018-02-16 DIAGNOSIS — I10 Essential (primary) hypertension: Secondary | ICD-10-CM | POA: Insufficient documentation

## 2018-02-16 DIAGNOSIS — R0789 Other chest pain: Secondary | ICD-10-CM | POA: Insufficient documentation

## 2018-02-16 DIAGNOSIS — Z79899 Other long term (current) drug therapy: Secondary | ICD-10-CM | POA: Insufficient documentation

## 2018-02-16 DIAGNOSIS — F17210 Nicotine dependence, cigarettes, uncomplicated: Secondary | ICD-10-CM | POA: Insufficient documentation

## 2018-02-16 DIAGNOSIS — Z794 Long term (current) use of insulin: Secondary | ICD-10-CM | POA: Insufficient documentation

## 2018-02-16 DIAGNOSIS — R55 Syncope and collapse: Secondary | ICD-10-CM | POA: Insufficient documentation

## 2018-02-16 DIAGNOSIS — E1142 Type 2 diabetes mellitus with diabetic polyneuropathy: Secondary | ICD-10-CM | POA: Insufficient documentation

## 2018-02-16 DIAGNOSIS — R51 Headache: Secondary | ICD-10-CM | POA: Insufficient documentation

## 2018-02-16 LAB — CBC WITH DIFFERENTIAL/PLATELET
BASOS ABS: 0 10*3/uL (ref 0–0.1)
BASOS PCT: 1 %
Eosinophils Absolute: 0.4 10*3/uL (ref 0–0.7)
Eosinophils Relative: 5 %
HCT: 30.1 % — ABNORMAL LOW (ref 35.0–47.0)
Hemoglobin: 10.4 g/dL — ABNORMAL LOW (ref 12.0–16.0)
LYMPHS PCT: 45 %
Lymphs Abs: 3.2 10*3/uL (ref 1.0–3.6)
MCH: 29.7 pg (ref 26.0–34.0)
MCHC: 34.7 g/dL (ref 32.0–36.0)
MCV: 85.6 fL (ref 80.0–100.0)
MONO ABS: 0.5 10*3/uL (ref 0.2–0.9)
Monocytes Relative: 7 %
NEUTROS ABS: 3 10*3/uL (ref 1.4–6.5)
Neutrophils Relative %: 42 %
PLATELETS: 213 10*3/uL (ref 150–440)
RBC: 3.51 MIL/uL — AB (ref 3.80–5.20)
RDW: 13.6 % (ref 11.5–14.5)
WBC: 7.2 10*3/uL (ref 3.6–11.0)

## 2018-02-16 MED ORDER — SODIUM CHLORIDE 0.9 % IV BOLUS
1000.0000 mL | Freq: Once | INTRAVENOUS | Status: AC
Start: 2018-02-16 — End: 2018-02-17
  Administered 2018-02-16: 1000 mL via INTRAVENOUS

## 2018-02-16 MED ORDER — DIPHENHYDRAMINE HCL 50 MG/ML IJ SOLN
25.0000 mg | Freq: Once | INTRAMUSCULAR | Status: AC
  Administered 2018-02-16: 25 mg via INTRAVENOUS
  Filled 2018-02-16: qty 1

## 2018-02-16 MED ORDER — KETOROLAC TROMETHAMINE 30 MG/ML IJ SOLN
15.0000 mg | Freq: Once | INTRAMUSCULAR | Status: AC
  Administered 2018-02-16: 15 mg via INTRAVENOUS
  Filled 2018-02-16: qty 1

## 2018-02-16 MED ORDER — PROCHLORPERAZINE EDISYLATE 10 MG/2ML IJ SOLN
10.0000 mg | Freq: Once | INTRAMUSCULAR | Status: AC
  Administered 2018-02-16: 10 mg via INTRAVENOUS
  Filled 2018-02-16: qty 2

## 2018-02-16 NOTE — ED Provider Notes (Signed)
[facility] Emergency Department Provider Note  ____________________________________________   First MD Initiated Contact with Patient 02/16/18 2306     (approximate)  I have reviewed the triage vital signs and the nursing notes.   HISTORY  Chief Complaint Loss of Consciousness   HPI Karen Lindsey is a 52 y.o. female who comes to the emergency department by EMS after having a syncopal event in the parking lot of her homeless shelter.  She said she developed gradual onset not maximal onset bifrontal throbbing headache similar to previous headaches prior to syncope.  She denies antecedent chest pain or palpitations.  She did have some prior nausea and warmth.  She currently has the persistent headache.  No numbness or weakness.  She is about 33 days clean from methadone.    Past Medical History:  Diagnosis Date  . Arthritis    rheumatoid  . Chronic pain    a. upper/mid back.  . Diabetic peripheral neuropathy (HCC)   . Fatty liver   . Hypertension   . Insulin dependent diabetes mellitus (HCC)    a. Dx ~ 2011.  . Narcotic addiction (HCC)    a. Followed in substance abuse center in [place] - on chronic methadone.  . Neuropathy   . RA (rheumatoid arthritis) (HCC)   . Tobacco abuse     Patient Active Problem List   Diagnosis Date Noted  . Narcotic abuse (HCC) 01/24/2018  . Acute delirium 01/24/2018  . DKA (diabetic ketoacidoses) (HCC) 01/23/2018  . AKI (acute kidney injury) (HCC) 01/23/2018  . HTN (hypertension) 01/23/2018  . Chest pain 11/08/2017    Past Surgical History:  Procedure Laterality Date  . ABDOMINAL HYSTERECTOMY    . CESAREAN SECTION    . CHOLECYSTECTOMY    . KNEE SURGERY    . MANDIBLE FRACTURE SURGERY      Prior to Admission medications   Medication Sig Start Date End Date Taking? Authorizing Provider  amitriptyline (ELAVIL) 10 MG tablet Take 1 tablet (10 mg total) by mouth at bedtime. 01/31/18   Katha Hamming, MD    amLODipine (NORVASC) 2.5 MG tablet Take 1 tablet (2.5 mg total) by mouth daily. 02/01/18   Katha Hamming, MD  DULoxetine (CYMBALTA) 30 MG capsule Take 30 mg by mouth daily.    [provider]  DULoxetine (CYMBALTA) 30 MG capsule Take 1 capsule (30 mg total) by mouth daily. 02/01/18   Katha Hamming, MD  gabapentin (NEURONTIN) 400 MG capsule Take 3 capsules (1,200 mg total) by mouth 3 (three) times daily. 01/31/18   Katha Hamming, MD  hydrOXYzine (VISTARIL) 25 MG capsule Take 25-50 mg by mouth 3 (three) times daily as needed for anxiety.    [provider]  insulin aspart protamine- aspart (NOVOLOG MIX 70/30) (70-30) 100 UNIT/ML injection Inject 0.42 mLs (42 Units total) into the skin 2 (two) times daily with a meal. 01/31/18   Katha Hamming, MD  lisinopril (PRINIVIL,ZESTRIL) 20 MG tablet Take 1 tablet (20 mg total) by mouth daily. 01/31/18   Katha Hamming, MD  loperamide (IMODIUM) 2 MG capsule Take 2 capsules (4 mg total) by mouth as needed for diarrhea or loose stools. 01/28/18   Katha Hamming, MD  methocarbamol (ROBAXIN) 750 MG tablet Take 1 tablet (750 mg total) by mouth every 8 (eight) hours as needed for muscle spasms (withdrawl pain). 01/31/18   Katha Hamming, MD  mupirocin ointment (BACTROBAN) 2 % Apply topically 2 (two) times daily. 01/31/18   Katha Hamming, MD  ondansetron (ZOFRAN) 4 MG tablet Take 1 tablet (4 mg total) by mouth every 8 (eight) hours as needed for nausea or vomiting. 01/31/18   Katha Hamming, MD  protein supplement shake (PREMIER PROTEIN) LIQD Take 325 mLs (11 oz total) by mouth 2 (two) times daily between meals. 01/27/18   Katha Hamming, MD  zolpidem (AMBIEN) 5 MG tablet Take 1 tablet (5 mg total) by mouth at bedtime as needed for sleep. 01/31/18   Katha Hamming, MD    Allergies Ultram Marcia Brash hcl]  No family history on file.  Social History Social History   Tobacco Use  . Smoking  status: Current Every Day Smoker    Packs/day: 0.50    Types: Cigarettes  . Smokeless tobacco: Never Used  . Tobacco comment: 30+ years  Substance Use Topics  . Alcohol use: No    Comment: Hasn't had a drink since 1998  . Drug use: Yes    Comment: Previously abused pain pills. prescribed methadone     Review of Systems Constitutional: No fever/chills Eyes: No visual changes. ENT: No sore throat. Cardiovascular: Denies chest pain. Respiratory: Denies shortness of breath. Gastrointestinal: No abdominal pain.  Positive for nausea, no vomiting.  No diarrhea.  No constipation. Genitourinary: Negative for dysuria. Musculoskeletal: Negative for back pain. Skin: Negative for rash. Neurological: Positive for headache  ____________________________________________   PHYSICAL EXAM:  VITAL SIGNS: ED Triage Vitals  Enc Vitals Group     BP      Pulse      Resp      Temp      Temp src      SpO2      Weight      Height      Head Circumference      Peak Flow      Pain Score      Pain Loc      Pain Edu?      Excl. in GC?     Constitutional: Alert and oriented x4 appears uncomfortable nontoxic no diaphoresis speaks focally sentences Eyes: PERRL EOMI. midrange and brisk Head: Atraumatic. Nose: No congestion/rhinnorhea. Mouth/Throat: No trismus Neck: No stridor.  No meningismus Cardiovascular: Normal rate, regular rhythm. Grossly normal heart sounds.  Good peripheral circulation. Respiratory: Normal respiratory effort.  No retractions. Lungs CTAB and moving good air Gastrointestinal: Soft nontender Musculoskeletal: No lower extremity edema   Neurologic:  Normal speech and language. No gross focal neurologic deficits are appreciated. Skin:  Skin is warm, dry and intact. No rash noted. Psychiatric: Mood and affect are normal. Speech and behavior are normal.    ____________________________________________   DIFFERENTIAL includes but not limited to  Subarachnoid hemorrhage,  cardiogenic syncope, vasovagal syncope, dehydration, alcohol intoxication ____________________________________________   LABS (all labs ordered are listed, but only abnormal results are displayed)  Labs Reviewed  BLOOD GAS, VENOUS - Abnormal; Notable for the following components:      Result Value   Bicarbonate 28.5 (*)    Acid-Base Excess 3.3 (*)    All other components within normal limits  COMPREHENSIVE METABOLIC PANEL - Abnormal; Notable for the following components:   Glucose, Bld 365 (*)    Calcium 8.3 (*)    Total Protein 6.0 (*)    Albumin 3.4 (*)    All other components within normal limits  CBC WITH DIFFERENTIAL/PLATELET - Abnormal; Notable for the following components:   RBC 3.51 (*)    Hemoglobin 10.4 (*)    HCT 30.1 (*)  All other components within normal limits  URINE DRUG SCREEN, QUALITATIVE ([facility] ONLY) - Abnormal; Notable for the following components:   Tricyclic, Ur Screen POSITIVE (*)    Benzodiazepine, Ur Scrn TEST NOT PERFORMED, REAGENT NOT AVAILABLE (*)    All other components within normal limits  ETHANOL  LIPASE, BLOOD  PROTIME-INR  TROPONIN I    Lab work reviewed by me with no acute disease __________________________________________  EKG  ED ECG REPORT I, Merrily Brittle, the attending physician, personally viewed and interpreted this ECG.  Date: 02/18/2018 EKG Time:  Rate: 74 Rhythm: normal sinus rhythm QRS Axis: normal Intervals: normal ST/T Wave abnormalities: normal Narrative Interpretation: no evidence of acute ischemia  ____________________________________________  RADIOLOGY  Head CT reviewed by me with no acute disease Chest x-ray reviewed by me with no acute disease ____________________________________________   PROCEDURES  Procedure(s) performed: no  Procedures  Critical Care performed: no  ____________________________________________   INITIAL IMPRESSION / ASSESSMENT AND PLAN / ED COURSE  Pertinent labs &  imaging results that were available during my care of the patient were reviewed by me and considered in my medical decision making (see chart for details).   As part of my medical decision making, I reviewed the following data within the electronic MEDICAL RECORD NUMBER History obtained from family if available, nursing notes, old chart and ekg, as well as notes from prior ED visits.  The patient comes the emergency department with severe headache and syncopal event.  Head CT obtained within 6 hours of the event is negative which is adequate to rule out subarachnoid hemorrhage.  Treated her headache symptomatically with Compazine Toradol and Benadryl with near complete resolution.  She feels improved after fluids.  She was kept on monitor several hours with no ectopy.  Discharged to the community in improved condition.      ____________________________________________   FINAL CLINICAL IMPRESSION(S) / ED DIAGNOSES  Final diagnoses:  Syncope, unspecified syncope type  Atypical chest pain      NEW MEDICATIONS STARTED DURING THIS VISIT:  Discharge Medication List as of 02/17/2018 12:50 AM       Note:  This document was prepared using Dragon voice recognition software and may include unintentional dictation errors.     Merrily Brittle, MD 02/18/18 [phone]

## 2018-02-16 NOTE — ED Triage Notes (Signed)
Pt arrives via [facility] for syncopal event in homeless shelter parking lot. Pt reports that she has a bad HA at this time as well. Per EMS, [facility] 303 on scene. Pt is in NAD.

## 2018-02-17 LAB — URINE DRUG SCREEN, QUALITATIVE (ARMC ONLY)
AMPHETAMINES, UR SCREEN: NOT DETECTED
Barbiturates, Ur Screen: NOT DETECTED
Cannabinoid 50 Ng, Ur ~~LOC~~: NOT DETECTED
Cocaine Metabolite,Ur ~~LOC~~: NOT DETECTED
MDMA (Ecstasy)Ur Screen: NOT DETECTED
METHADONE SCREEN, URINE: NOT DETECTED
Opiate, Ur Screen: NOT DETECTED
PHENCYCLIDINE (PCP) UR S: NOT DETECTED
Tricyclic, Ur Screen: POSITIVE — AB

## 2018-02-17 LAB — COMPREHENSIVE METABOLIC PANEL
ALT: 44 U/L (ref 0–44)
ANION GAP: 5 (ref 5–15)
AST: 26 U/L (ref 15–41)
Albumin: 3.4 g/dL — ABNORMAL LOW (ref 3.5–5.0)
Alkaline Phosphatase: 83 U/L (ref 38–126)
BILIRUBIN TOTAL: 0.4 mg/dL (ref 0.3–1.2)
BUN: 20 mg/dL (ref 6–20)
CO2: 27 mmol/L (ref 22–32)
Calcium: 8.3 mg/dL — ABNORMAL LOW (ref 8.9–10.3)
Chloride: 107 mmol/L (ref 98–111)
Creatinine, Ser: 0.59 mg/dL (ref 0.44–1.00)
Glucose, Bld: 365 mg/dL — ABNORMAL HIGH (ref 70–99)
POTASSIUM: 4.1 mmol/L (ref 3.5–5.1)
Sodium: 139 mmol/L (ref 135–145)
TOTAL PROTEIN: 6 g/dL — AB (ref 6.5–8.1)

## 2018-02-17 LAB — LIPASE, BLOOD: LIPASE: 47 U/L (ref 11–51)

## 2018-02-17 LAB — BLOOD GAS, VENOUS
ACID-BASE EXCESS: 3.3 mmol/L — AB (ref 0.0–2.0)
BICARBONATE: 28.5 mmol/L — AB (ref 20.0–28.0)
O2 SAT: 75.5 %
PATIENT TEMPERATURE: 37
pCO2, Ven: 45 mmHg (ref 44.0–60.0)
pH, Ven: 7.41 (ref 7.250–7.430)
pO2, Ven: 40 mmHg (ref 32.0–45.0)

## 2018-02-17 LAB — ETHANOL: Alcohol, Ethyl (B): <10 mg/dL (ref <10)

## 2018-02-17 LAB — PROTIME-INR
INR: 0.96
Prothrombin Time: 12.7 seconds (ref 11.4–15.2)

## 2018-02-17 LAB — TROPONIN I

## 2018-02-17 NOTE — Discharge Instructions (Signed)
It was a pleasure to take care of you today, and thank you for coming to our emergency department.  If you have any questions or concerns before leaving please ask the nurse to grab me and I'm more than happy to go through your aftercare instructions again.  If you were prescribed any opioid pain medication today such as Norco, Vicodin, Percocet, morphine, hydrocodone, or oxycodone please make sure you do not drive when you are taking this medication as it can alter your ability to drive safely.  If you have any concerns once you are home that you are not improving or are in fact getting worse before you can make it to your follow-up appointment, please do not hesitate to call 911 and come back for further evaluation.  Merrily Brittle, MD  Results for orders placed or performed during the hospital encounter of 02/16/18  Blood gas, venous  Result Value Ref Range   pH, Ven 7.41 7.250 - 7.430   pCO2, Ven 45 44.0 - 60.0 mmHg   pO2, Ven 40.0 32.0 - 45.0 mmHg   Bicarbonate 28.5 (H) 20.0 - 28.0 mmol/L   Acid-Base Excess 3.3 (H) 0.0 - 2.0 mmol/L   O2 Saturation 75.5 %   Patient temperature 37.0    Collection site VEIN    Sample type VENOUS   Ethanol  Result Value Ref Range   Alcohol, Ethyl (B) <10 <10 mg/dL  CBC with Differential  Result Value Ref Range   WBC 7.2 3.6 - 11.0 K/uL   RBC 3.51 (L) 3.80 - 5.20 MIL/uL   Hemoglobin 10.4 (L) 12.0 - 16.0 g/dL   HCT 53.9 (L) 76.7 - 34.1 %   MCV 85.6 80.0 - 100.0 fL   MCH 29.7 26.0 - 34.0 pg   MCHC 34.7 32.0 - 36.0 g/dL   RDW 93.7 90.2 - 40.9 %   Platelets 213 150 - 440 K/uL   Neutrophils Relative % 42 %   Neutro Abs 3.0 1.4 - 6.5 K/uL   Lymphocytes Relative 45 %   Lymphs Abs 3.2 1.0 - 3.6 K/uL   Monocytes Relative 7 %   Monocytes Absolute 0.5 0.2 - 0.9 K/uL   Eosinophils Relative 5 %   Eosinophils Absolute 0.4 0 - 0.7 K/uL   Basophils Relative 1 %   Basophils Absolute 0.0 0 - 0.1 K/uL  Urine Drug Screen, Qualitative  Result Value Ref Range     Tricyclic, Ur Screen POSITIVE (A) NONE DETECTED   Amphetamines, Ur Screen NONE DETECTED NONE DETECTED   MDMA (Ecstasy)Ur Screen NONE DETECTED NONE DETECTED   Cocaine Metabolite,Ur [place] NONE DETECTED NONE DETECTED   Opiate, Ur Screen NONE DETECTED NONE DETECTED   Phencyclidine (PCP) Ur S NONE DETECTED NONE DETECTED   Cannabinoid 50 Ng, Ur [place] NONE DETECTED NONE DETECTED   Barbiturates, Ur Screen NONE DETECTED NONE DETECTED   Benzodiazepine, Ur Scrn TEST NOT PERFORMED, REAGENT NOT AVAILABLE (A) NONE DETECTED   Methadone Scn, Ur NONE DETECTED NONE DETECTED  Protime-INR  Result Value Ref Range   Prothrombin Time 12.7 11.4 - 15.2 seconds   INR 0.96   Troponin I  Result Value Ref Range   Troponin I <0.03 <0.03 ng/mL   Dg Chest 2 View  Result Date: 01/20/2018 CLINICAL DATA:  Patient reports onset of heart palpitations today. Reports some SOB. Denies cough or fever. Hx DM, HTN, cholecystectomy. Patient has previous hx of drug use and has been on methadone, but has not taken it in the past 3  days. Current smoker. EXAM: CHEST - 2 VIEW COMPARISON:  01/08/2018 FINDINGS: The heart size and mediastinal contours are within normal limits. Both lungs are clear. No pleural effusion or pneumothorax. The visualized skeletal structures are unremarkable. IMPRESSION: No active cardiopulmonary disease. Electronically Signed   By: Amie Portland M.D.   On: 01/20/2018 19:05   Ct Head Wo Contrast  Result Date: 02/17/2018 CLINICAL DATA:  Syncope and headache EXAM: CT HEAD WITHOUT CONTRAST TECHNIQUE: Contiguous axial images were obtained from the base of the skull through the vertex without intravenous contrast. COMPARISON:  Head CT 01/24/2018 FINDINGS: Brain: There is no mass, hemorrhage or extra-axial collection. The size and configuration of the ventricles and extra-axial CSF spaces are normal. There is no acute or chronic infarction. The brain parenchyma is normal. Vascular: No abnormal hyperdensity of the major  intracranial arteries or dural venous sinuses. No intracranial atherosclerosis. Skull: The visualized skull base, calvarium and extracranial soft tissues are normal. Sinuses/Orbits: No fluid levels or advanced mucosal thickening of the visualized paranasal sinuses. No mastoid or middle ear effusion. The orbits are normal. IMPRESSION: Normal head CT. Electronically Signed   By: Deatra Robinson M.D.   On: 02/17/2018 00:04   Ct Head Wo Contrast  Result Date: 01/24/2018 CLINICAL DATA:  52 y/o F; episode of unresponsiveness and hyperglycemia. EXAM: CT HEAD WITHOUT CONTRAST TECHNIQUE: Contiguous axial images were obtained from the base of the skull through the vertex without intravenous contrast. COMPARISON:  11/08/2017 CT head. FINDINGS: Brain: No evidence of acute infarction, hemorrhage, hydrocephalus, extra-axial collection or mass lesion/mass effect. Stable mild chronic microvascular ischemic changes and volume loss of the brain. Vascular: Calcific atherosclerosis of carotid siphons. No hyperdense vessel identified. Skull: Normal. Negative for fracture or focal lesion. Sinuses/Orbits: No acute finding. Other: None. IMPRESSION: 1. No acute intracranial abnormality identified. 2. Stable mild chronic microvascular ischemic changes and volume loss of the brain. Electronically Signed   By: Mitzi Hansen M.D.   On: 01/24/2018 05:42   Dg Chest Port 1 View  Result Date: 02/17/2018 CLINICAL DATA:  Chest pain after fall EXAM: PORTABLE CHEST 1 VIEW COMPARISON:  01/24/2018 FINDINGS: The heart size and mediastinal contours are within normal limits. Both lungs are clear. The visualized skeletal structures are unremarkable. IMPRESSION: No active disease. Electronically Signed   By: Deatra Robinson M.D.   On: 02/17/2018 00:09   Dg Chest Port 1 View  Result Date: 01/24/2018 CLINICAL DATA:  52 y/o F; arrived unresponsive, nonresponsive, history of drug abuse. EXAM: PORTABLE CHEST 1 VIEW COMPARISON:  01/20/2018 chest  radiograph. FINDINGS: Normal cardiac silhouette. Aortic atherosclerosis with calcification. Clear lungs. No pleural effusion or pneumothorax. No acute osseous abnormality is evident. Right upper quadrant cholecystectomy surgical clips. IMPRESSION: No active disease. Electronically Signed   By: Mitzi Hansen M.D.   On: 01/24/2018 02:29

## 2018-02-26 ENCOUNTER — Other Ambulatory Visit: Payer: Self-pay

## 2018-02-26 ENCOUNTER — Encounter: Payer: Self-pay | Admitting: *Deleted

## 2018-02-26 ENCOUNTER — Emergency Department
Admission: EM | Admit: 2018-02-26 | Discharge: 2018-02-27 | Disposition: A | Discharge status: Other Acute Inpatient Hospital | Payer: Self-pay | Attending: Emergency Medicine | Admitting: Emergency Medicine

## 2018-02-26 DIAGNOSIS — F333 Major depressive disorder, recurrent, severe with psychotic symptoms: Secondary | ICD-10-CM | POA: Insufficient documentation

## 2018-02-26 DIAGNOSIS — R45851 Suicidal ideations: Secondary | ICD-10-CM | POA: Insufficient documentation

## 2018-02-26 DIAGNOSIS — F17210 Nicotine dependence, cigarettes, uncomplicated: Secondary | ICD-10-CM | POA: Insufficient documentation

## 2018-02-26 DIAGNOSIS — I10 Essential (primary) hypertension: Secondary | ICD-10-CM | POA: Insufficient documentation

## 2018-02-26 DIAGNOSIS — E119 Type 2 diabetes mellitus without complications: Secondary | ICD-10-CM

## 2018-02-26 DIAGNOSIS — Z046 Encounter for general psychiatric examination, requested by authority: Secondary | ICD-10-CM | POA: Insufficient documentation

## 2018-02-26 DIAGNOSIS — G8929 Other chronic pain: Secondary | ICD-10-CM

## 2018-02-26 DIAGNOSIS — Z79899 Other long term (current) drug therapy: Secondary | ICD-10-CM | POA: Insufficient documentation

## 2018-02-26 DIAGNOSIS — F332 Major depressive disorder, recurrent severe without psychotic features: Secondary | ICD-10-CM

## 2018-02-26 DIAGNOSIS — Z794 Long term (current) use of insulin: Secondary | ICD-10-CM | POA: Insufficient documentation

## 2018-02-26 LAB — CBC
HCT: 43 % (ref 35.0–47.0)
HEMOGLOBIN: 14.4 g/dL (ref 12.0–16.0)
MCH: 28.9 pg (ref 26.0–34.0)
MCHC: 33.6 g/dL (ref 32.0–36.0)
MCV: 85.9 fL (ref 80.0–100.0)
Platelets: 277 10*3/uL (ref 150–440)
RBC: 5 MIL/uL (ref 3.80–5.20)
RDW: 13.9 % (ref 11.5–14.5)
WBC: 11.6 10*3/uL — ABNORMAL HIGH (ref 3.6–11.0)

## 2018-02-26 LAB — COMPREHENSIVE METABOLIC PANEL
ALK PHOS: 70 U/L (ref 38–126)
ALT: 18 U/L (ref 0–44)
ANION GAP: 7 (ref 5–15)
AST: 23 U/L (ref 15–41)
Albumin: 4.3 g/dL (ref 3.5–5.0)
BUN: 16 mg/dL (ref 6–20)
CALCIUM: 9 mg/dL (ref 8.9–10.3)
CO2: 26 mmol/L (ref 22–32)
Chloride: 107 mmol/L (ref 98–111)
Creatinine, Ser: 0.43 mg/dL — ABNORMAL LOW (ref 0.44–1.00)
GFR calc non Af Amer: >60 mL/min (ref >60)
Glucose, Bld: 150 mg/dL — ABNORMAL HIGH (ref 70–99)
Potassium: 4.2 mmol/L (ref 3.5–5.1)
Sodium: 140 mmol/L (ref 135–145)
TOTAL PROTEIN: 7.5 g/dL (ref 6.5–8.1)
Total Bilirubin: 0.7 mg/dL (ref 0.3–1.2)

## 2018-02-26 LAB — URINE DRUG SCREEN, QUALITATIVE (ARMC ONLY)
Amphetamines, Ur Screen: NOT DETECTED
BARBITURATES, UR SCREEN: NOT DETECTED
Cannabinoid 50 Ng, Ur ~~LOC~~: NOT DETECTED
Cocaine Metabolite,Ur ~~LOC~~: NOT DETECTED
MDMA (Ecstasy)Ur Screen: NOT DETECTED
METHADONE SCREEN, URINE: NOT DETECTED
Opiate, Ur Screen: NOT DETECTED
Phencyclidine (PCP) Ur S: NOT DETECTED
TRICYCLIC, UR SCREEN: POSITIVE — AB

## 2018-02-26 LAB — ETHANOL: Alcohol, Ethyl (B): <10 mg/dL (ref <10)

## 2018-02-26 LAB — SALICYLATE LEVEL

## 2018-02-26 LAB — ACETAMINOPHEN LEVEL

## 2018-02-26 LAB — GLUCOSE, CAPILLARY: GLUCOSE-CAPILLARY: 249 mg/dL — AB (ref 70–99)

## 2018-02-26 MED ORDER — INSULIN ASPART 100 UNIT/ML ~~LOC~~ SOLN
0.0000 [IU] | Freq: Three times a day (TID) | SUBCUTANEOUS | Status: DC
  Administered 2018-02-27: 3 [IU] via SUBCUTANEOUS
  Filled 2018-02-26: qty 1

## 2018-02-26 MED ORDER — INSULIN ASPART PROT & ASPART (70-30 MIX) 100 UNIT/ML ~~LOC~~ SUSP
30.0000 [IU] | Freq: Two times a day (BID) | SUBCUTANEOUS | Status: DC
  Administered 2018-02-26 – 2018-02-27 (×2): 30 [IU] via SUBCUTANEOUS
  Filled 2018-02-26 (×3): qty 10

## 2018-02-26 MED ORDER — AMITRIPTYLINE HCL 10 MG PO TABS
10.0000 mg | ORAL_TABLET | Freq: Every day | ORAL | Status: DC
  Administered 2018-02-26: 10 mg via ORAL
  Filled 2018-02-26 (×3): qty 1

## 2018-02-26 MED ORDER — HYDROXYZINE HCL 25 MG PO TABS
25.0000 mg | ORAL_TABLET | Freq: Three times a day (TID) | ORAL | Status: DC | PRN
  Administered 2018-02-26: 50 mg via ORAL
  Filled 2018-02-26: qty 1
  Filled 2018-02-26: qty 2

## 2018-02-26 MED ORDER — AMLODIPINE BESYLATE 5 MG PO TABS
2.5000 mg | ORAL_TABLET | Freq: Every day | ORAL | Status: DC
  Administered 2018-02-27: 2.5 mg via ORAL
  Filled 2018-02-26: qty 1

## 2018-02-26 MED ORDER — GABAPENTIN 300 MG PO CAPS
1200.0000 mg | ORAL_CAPSULE | Freq: Three times a day (TID) | ORAL | Status: DC
  Administered 2018-02-26 – 2018-02-27 (×3): 1200 mg via ORAL
  Filled 2018-02-26: qty 3
  Filled 2018-02-26: qty 4
  Filled 2018-02-26: qty 3
  Filled 2018-02-26: qty 4

## 2018-02-26 MED ORDER — LISINOPRIL 20 MG PO TABS
20.0000 mg | ORAL_TABLET | Freq: Every day | ORAL | Status: DC
  Administered 2018-02-27: 20 mg via ORAL
  Filled 2018-02-26: qty 1

## 2018-02-26 MED ORDER — ONDANSETRON HCL 4 MG PO TABS: 4.0000 mg | ORAL_TABLET | Freq: Three times a day (TID) | ORAL | Status: DC | PRN

## 2018-02-26 MED ORDER — IBUPROFEN 600 MG PO TABS
600.0000 mg | ORAL_TABLET | ORAL | Status: AC
  Administered 2018-02-26: 600 mg via ORAL
  Filled 2018-02-26: qty 1

## 2018-02-26 MED ORDER — HYDROXYZINE PAMOATE 25 MG PO CAPS: 25.0000 mg | ORAL_CAPSULE | Freq: Three times a day (TID) | ORAL | Status: DC | PRN

## 2018-02-26 NOTE — ED Notes (Signed)
Patient is eating some food, but about 25% and she said its not enough for her to take her insulin aspart protamine- aspart (NOVOLOG MIX 70/30) injection 30 Units, she said she hasn't eaten all day. Will offer again when she eats more

## 2018-02-26 NOTE — ED Notes (Signed)
IVC 

## 2018-02-26 NOTE — ED Triage Notes (Signed)
Pt brought in by [facility] from [facility]. Pt is IVC.  Pt reports feeling suicidal today.  Denies HI.  Denies etoh use.  States drug use.  Pt calm and cooperative.

## 2018-02-26 NOTE — ED Notes (Signed)
Patient assigned to appropriate care area   Introduced self to pt  Patient oriented to unit/care area: Informed that, for their safety, care areas are designed for safety and visiting and phone hours explained to patient. Patient verbalizes understanding, and verbal contract for safety obtained  Environment secured. Patient is tearful.  Pt brought in by [facility] from [facility]. Pt is IVC.  Pt reports feeling suicidal today. Pt calm and cooperative. Patient stated that she took oxycodone this weekend from a friend due to the pain of Rheumatoid Arthritis and diabetic neuropathy. Patient said about 2 months ago she quit methadone cold [place] and she has been depressed ever since.

## 2018-02-26 NOTE — ED Notes (Signed)
Pt. Transferred to [facility] from ED to room 1 after screening for contraband. Report to include Situation, Background, Assessment and Recommendations from [facility]. Pt. Oriented to unit including Q15 minute rounds as well as the security cameras for their protection. Patient is alert and oriented, warm and dry in no acute distress. Patient denies HI, and AH. Patient states she still has SI to cut her legs off and sees shadows. Pt. Encouraged to let me know if needs arise.

## 2018-02-26 NOTE — ED Notes (Signed)
Pt requesting pain mediatation. Dr Fanny Bien informed. Will order meds.

## 2018-02-26 NOTE — ED Notes (Signed)
Hourly rounding reveals patient in room. No complaints, stable, in no acute distress. Q15 minute rounds and monitoring via Security Cameras to continue. 

## 2018-02-26 NOTE — ED Notes (Signed)
Hourly rounding reveals patient in room. C/O insomnia and anxiety, stable, in no acute distress. Q15 minute rounds and monitoring via [employer] to continue.

## 2018-02-26 NOTE — ED Notes (Signed)
Hourly rounding reveals patient sleeping in room. No complaints, stable, in no acute distress. Q15 minute rounds and monitoring via Security Cameras to continue. 

## 2018-02-26 NOTE — ED Provider Notes (Signed)
[facility] Emergency Department Provider Note   ____________________________________________   First MD Initiated Contact with Patient 02/26/18 1703     (approximate)  I have reviewed the triage vital signs and the nursing notes.   HISTORY  Chief Complaint Behavior Problem   HPI Karen Lindsey is a 52 y.o. female history of diabetes, recent admission for hypokalemia and DKA.  She reports this happened in the setting of methadone withdrawal.  She reports she is since been able to stop it is no longer using methadone.  She went to [facility] clinic today and reported her severe depressive symptoms which she reports she is had for at least a few weeks time, having ideas commit suicide at times but does not have a specific plan.  No nausea or vomiting.  No fevers or chills.  Reports her blood sugars been much better and she is using her twice daily insulin 42 units of NPH at this time.  She does report at times her blood sugars will get a little bit low and she is having to occasionally hold a dose of her insulin.  She last used insulin today this AM.    Past Medical History:  Diagnosis Date  . Arthritis    rheumatoid  . Chronic pain    a. upper/mid back.  . Diabetic peripheral neuropathy (HCC)   . Fatty liver   . Hypertension   . Insulin dependent diabetes mellitus (HCC)    a. Dx ~ 2011.  . Narcotic addiction (HCC)    a. Followed in substance abuse center in [place] - on chronic methadone.  . Neuropathy   . RA (rheumatoid arthritis) (HCC)   . Tobacco abuse     Patient Active Problem List   Diagnosis Date Noted  . Narcotic abuse (HCC) 01/24/2018  . Acute delirium 01/24/2018  . DKA (diabetic ketoacidoses) (HCC) 01/23/2018  . AKI (acute kidney injury) (HCC) 01/23/2018  . HTN (hypertension) 01/23/2018  . Chest pain 11/08/2017    Past Surgical History:  Procedure Laterality Date  . ABDOMINAL HYSTERECTOMY    . CESAREAN SECTION    . CHOLECYSTECTOMY    . KNEE  SURGERY    . MANDIBLE FRACTURE SURGERY      Prior to Admission medications   Medication Sig Start Date End Date Taking? Authorizing Provider  amitriptyline (ELAVIL) 10 MG tablet Take 1 tablet (10 mg total) by mouth at bedtime. 01/31/18   Katha Hamming, MD  amLODipine (NORVASC) 2.5 MG tablet Take 1 tablet (2.5 mg total) by mouth daily. 02/01/18   Katha Hamming, MD  DULoxetine (CYMBALTA) 30 MG capsule Take 30 mg by mouth daily.    [provider]  DULoxetine (CYMBALTA) 30 MG capsule Take 1 capsule (30 mg total) by mouth daily. 02/01/18   Katha Hamming, MD  gabapentin (NEURONTIN) 400 MG capsule Take 3 capsules (1,200 mg total) by mouth 3 (three) times daily. 01/31/18   Katha Hamming, MD  hydrOXYzine (VISTARIL) 25 MG capsule Take 25-50 mg by mouth 3 (three) times daily as needed for anxiety.    [provider]  insulin aspart protamine- aspart (NOVOLOG MIX 70/30) (70-30) 100 UNIT/ML injection Inject 0.42 mLs (42 Units total) into the skin 2 (two) times daily with a meal. 01/31/18   Katha Hamming, MD  lisinopril (PRINIVIL,ZESTRIL) 20 MG tablet Take 1 tablet (20 mg total) by mouth daily. 01/31/18   Katha Hamming, MD  loperamide (IMODIUM) 2 MG capsule Take 2 capsules (4 mg total) by mouth  as needed for diarrhea or loose stools. 01/28/18   Katha Hamming, MD  methocarbamol (ROBAXIN) 750 MG tablet Take 1 tablet (750 mg total) by mouth every 8 (eight) hours as needed for muscle spasms (withdrawl pain). 01/31/18   Katha Hamming, MD  mupirocin ointment (BACTROBAN) 2 % Apply topically 2 (two) times daily. 01/31/18   Katha Hamming, MD  ondansetron (ZOFRAN) 4 MG tablet Take 1 tablet (4 mg total) by mouth every 8 (eight) hours as needed for nausea or vomiting. 01/31/18   Katha Hamming, MD  protein supplement shake (PREMIER PROTEIN) LIQD Take 325 mLs (11 oz total) by mouth 2 (two) times daily between meals. 01/27/18   Katha Hamming,  MD  zolpidem (AMBIEN) 5 MG tablet Take 1 tablet (5 mg total) by mouth at bedtime as needed for sleep. 01/31/18   Katha Hamming, MD    Allergies Ultram Marcia Brash hcl]  No family history on file.  Social History Social History   Tobacco Use  . Smoking status: Current Every Day Smoker    Packs/day: 0.50    Types: Cigarettes  . Smokeless tobacco: Never Used  . Tobacco comment: 30+ years  Substance Use Topics  . Alcohol use: No    Comment: Hasn't had a drink since 1998  . Drug use: Yes    Comment: Previously abused pain pills. prescribed methadone     Review of Systems Constitutional: No fever/chills Eyes: No visual changes. ENT: No sore throat. Cardiovascular: Denies chest pain. Respiratory: Denies shortness of breath. Gastrointestinal: No abdominal pain.  No nausea, no vomiting.  No diarrhea.  No constipation. Genitourinary: Negative for dysuria. Musculoskeletal: Negative for back pain. Skin: Negative for rash. Neurological: Negative for headaches, focal weakness or numbness.  Some suicidal ideations.  Reports compliant with her medications.  No hallucinations.    ____________________________________________   PHYSICAL EXAM:  VITAL SIGNS: ED Triage Vitals  Enc Vitals Group     BP 02/26/18 1628 133/80     Pulse Rate 02/26/18 1628 100     Resp 02/26/18 1628 18     Temp 02/26/18 1628 98.5 F (36.9 C)     Temp Source 02/26/18 1628 Oral     SpO2 02/26/18 1628 100 %     Weight 02/26/18 1629 127 lb (57.6 kg)     Height 02/26/18 1629 5\' 1"  (1.549 m)     Head Circumference --      Peak Flow --      Pain Score --      Pain Loc --      Pain Edu? --      Excl. in GC? --     Constitutional: Alert and oriented. Well appearing and in no acute distress.  She is resting very pleasantly.  Eyes: Conjunctivae are normal. Head: Atraumatic. Nose: No congestion/rhinnorhea. Mouth/Throat: Mucous membranes are moist. Neck: No stridor.   Cardiovascular: Normal rate,  regular rhythm. Grossly normal heart sounds.  Good peripheral circulation. Respiratory: Normal respiratory effort.  No retractions. Lungs CTAB. Gastrointestinal: Soft and nontender. No distention. Musculoskeletal: No lower extremity tenderness nor edema. Neurologic:  Normal speech and language. No gross focal neurologic deficits are appreciated.  Skin:  Skin is warm, dry and intact. No rash noted. Psychiatric: Mood and affect are slightly flat. Speech and behavior are normal.  Agree she would not harm herself while here in the ER, but has thoughts about suicide often at home with severe depression.  ____________________________________________   LABS (all labs ordered are listed, but only abnormal  results are displayed)  Labs Reviewed  COMPREHENSIVE METABOLIC PANEL - Abnormal; Notable for the following components:      Result Value   Glucose, Bld 150 (*)    Creatinine, Ser 0.43 (*)    All other components within normal limits  ACETAMINOPHEN LEVEL - Abnormal; Notable for the following components:   Acetaminophen (Tylenol), Serum <10 (*)    All other components within normal limits  CBC - Abnormal; Notable for the following components:   WBC 11.6 (*)    All other components within normal limits  ETHANOL  SALICYLATE LEVEL  URINE DRUG SCREEN, QUALITATIVE ([facility] ONLY)   ____________________________________________  EKG   ____________________________________________  RADIOLOGY   ____________________________________________   PROCEDURES  Procedure(s) performed: None  Procedures  Critical Care performed: No  ____________________________________________   INITIAL IMPRESSION / ASSESSMENT AND PLAN / ED COURSE  Pertinent labs & imaging results that were available during my care of the patient were reviewed by me and considered in my medical decision making (see chart for details).  Patient presents under involuntary commitment from local psychiatric clinic.  Reports  suicidal ideations, but no plan and contracts verbally for safety here.  Please consult to psychiatry.  Recent admission for DKA in the setting methadone withdrawal withdrawal, lab work very reassuring and reports compliance with her insulin.  Will reorder her home medications, and have reduced her insulin dose some and had a slight sliding scale she reports she is occasionally having some low times we will continue to monitor her glucoses regularly.  ----------------------------------------- 5:21 PM on 02/26/2018 -----------------------------------------  Medically cleared for psychiatric evaluation at this time.    Ongoing care assigned to Dr. Lamont Snowball at approximately 1 AM.  Follow-up on recommendations from psychiatry.  ____________________________________________   FINAL CLINICAL IMPRESSION(S) / ED DIAGNOSES  Final diagnoses:  Severe episode of recurrent major depressive disorder, with psychotic features (HCC)      NEW MEDICATIONS STARTED DURING THIS VISIT:  New Prescriptions   No medications on file     Note:  This document was prepared using Dragon voice recognition software and may include unintentional dictation errors.     Sharyn Creamer, MD 02/27/18 1049

## 2018-02-27 ENCOUNTER — Other Ambulatory Visit: Payer: Self-pay

## 2018-02-27 ENCOUNTER — Inpatient Hospital Stay
Admission: AD | Admit: 2018-02-27 | Discharge: 2018-03-07 | DRG: 885 | Disposition: A | Discharge status: Home / Self Care | Payer: No Typology Code available for payment source | Source: Intra-hospital | Attending: Psychiatry | Admitting: Psychiatry

## 2018-02-27 DIAGNOSIS — R45851 Suicidal ideations: Secondary | ICD-10-CM

## 2018-02-27 DIAGNOSIS — Z9049 Acquired absence of other specified parts of digestive tract: Secondary | ICD-10-CM

## 2018-02-27 DIAGNOSIS — K760 Fatty (change of) liver, not elsewhere classified: Secondary | ICD-10-CM | POA: Diagnosis present

## 2018-02-27 DIAGNOSIS — I10 Essential (primary) hypertension: Secondary | ICD-10-CM | POA: Diagnosis present

## 2018-02-27 DIAGNOSIS — G4700 Insomnia, unspecified: Secondary | ICD-10-CM | POA: Diagnosis present

## 2018-02-27 DIAGNOSIS — Z885 Allergy status to narcotic agent status: Secondary | ICD-10-CM

## 2018-02-27 DIAGNOSIS — Z90710 Acquired absence of both cervix and uterus: Secondary | ICD-10-CM | POA: Diagnosis not present

## 2018-02-27 DIAGNOSIS — M069 Rheumatoid arthritis, unspecified: Secondary | ICD-10-CM | POA: Diagnosis present

## 2018-02-27 DIAGNOSIS — Z809 Family history of malignant neoplasm, unspecified: Secondary | ICD-10-CM | POA: Diagnosis not present

## 2018-02-27 DIAGNOSIS — E119 Type 2 diabetes mellitus without complications: Secondary | ICD-10-CM

## 2018-02-27 DIAGNOSIS — E1142 Type 2 diabetes mellitus with diabetic polyneuropathy: Secondary | ICD-10-CM | POA: Diagnosis present

## 2018-02-27 DIAGNOSIS — F332 Major depressive disorder, recurrent severe without psychotic features: Secondary | ICD-10-CM

## 2018-02-27 DIAGNOSIS — Z794 Long term (current) use of insulin: Secondary | ICD-10-CM | POA: Diagnosis not present

## 2018-02-27 DIAGNOSIS — G8929 Other chronic pain: Secondary | ICD-10-CM

## 2018-02-27 DIAGNOSIS — F17210 Nicotine dependence, cigarettes, uncomplicated: Secondary | ICD-10-CM | POA: Diagnosis present

## 2018-02-27 DIAGNOSIS — Z590 Homelessness: Secondary | ICD-10-CM

## 2018-02-27 DIAGNOSIS — F17200 Nicotine dependence, unspecified, uncomplicated: Secondary | ICD-10-CM | POA: Diagnosis present

## 2018-02-27 DIAGNOSIS — Z915 Personal history of self-harm: Secondary | ICD-10-CM | POA: Diagnosis not present

## 2018-02-27 LAB — GLUCOSE, CAPILLARY
GLUCOSE-CAPILLARY: 62 mg/dL — AB (ref 70–99)
GLUCOSE-CAPILLARY: 80 mg/dL (ref 70–99)
GLUCOSE-CAPILLARY: 210 mg/dL — AB (ref 70–99)
GLUCOSE-CAPILLARY: 179 mg/dL — AB (ref 70–99)
Glucose-Capillary: 121 mg/dL — ABNORMAL HIGH (ref 70–99)
Glucose-Capillary: 55 mg/dL — ABNORMAL LOW (ref 70–99)
Glucose-Capillary: 140 mg/dL — ABNORMAL HIGH (ref 70–99)
Glucose-Capillary: 155 mg/dL — ABNORMAL HIGH (ref 70–99)
Glucose-Capillary: 315 mg/dL — ABNORMAL HIGH (ref 70–99)
Glucose-Capillary: 181 mg/dL — ABNORMAL HIGH (ref 70–99)

## 2018-02-27 MED ORDER — LOPERAMIDE HCL 2 MG PO CAPS
2.0000 mg | ORAL_CAPSULE | ORAL | Status: DC | PRN
  Administered 2018-02-28 – 2018-03-06 (×2): 2 mg via ORAL
  Filled 2018-02-27 (×2): qty 1

## 2018-02-27 MED ORDER — IBUPROFEN 600 MG PO TABS
600.0000 mg | ORAL_TABLET | Freq: Once | ORAL | Status: AC
  Administered 2018-02-27: 600 mg via ORAL
  Filled 2018-02-27: qty 1

## 2018-02-27 MED ORDER — MAGNESIUM HYDROXIDE 400 MG/5ML PO SUSP: 30.0000 mL | Freq: Every day | ORAL | Status: DC | PRN

## 2018-02-27 MED ORDER — CAPSAICIN 0.025 % EX CREA
TOPICAL_CREAM | Freq: Two times a day (BID) | CUTANEOUS | Status: DC
  Administered 2018-02-27: 15:00:00 via TOPICAL
  Filled 2018-02-27 (×2): qty 60

## 2018-02-27 MED ORDER — CAPSAICIN 0.025 % EX CREA
1.0000 "application " | TOPICAL_CREAM | Freq: Two times a day (BID) | CUTANEOUS | Status: DC
  Administered 2018-02-27: 1 via TOPICAL
  Filled 2018-02-27: qty 60

## 2018-02-27 MED ORDER — TRAZODONE HCL 100 MG PO TABS
100.0000 mg | ORAL_TABLET | Freq: Every evening | ORAL | Status: DC | PRN
  Administered 2018-02-27: 100 mg via ORAL
  Filled 2018-02-27: qty 1

## 2018-02-27 MED ORDER — LISINOPRIL 20 MG PO TABS
20.0000 mg | ORAL_TABLET | Freq: Every day | ORAL | Status: DC
  Administered 2018-02-28 – 2018-03-07 (×8): 20 mg via ORAL
  Filled 2018-02-27 (×8): qty 1

## 2018-02-27 MED ORDER — AMLODIPINE BESYLATE 5 MG PO TABS
2.5000 mg | ORAL_TABLET | Freq: Every day | ORAL | Status: DC
  Administered 2018-02-28 – 2018-03-07 (×8): 2.5 mg via ORAL
  Filled 2018-02-27 (×8): qty 1

## 2018-02-27 MED ORDER — GABAPENTIN 400 MG PO CAPS
1200.0000 mg | ORAL_CAPSULE | Freq: Three times a day (TID) | ORAL | Status: DC
  Administered 2018-02-27 – 2018-03-07 (×23): 1200 mg via ORAL
  Filled 2018-02-27 (×23): qty 3

## 2018-02-27 MED ORDER — INSULIN ASPART 100 UNIT/ML ~~LOC~~ SOLN
0.0000 [IU] | Freq: Three times a day (TID) | SUBCUTANEOUS | Status: DC
  Administered 2018-02-28: 5 [IU] via SUBCUTANEOUS
  Administered 2018-02-28: 8 [IU] via SUBCUTANEOUS
  Administered 2018-03-01: 2 [IU] via SUBCUTANEOUS
  Administered 2018-03-01: 8 [IU] via SUBCUTANEOUS
  Administered 2018-03-01: 5 [IU] via SUBCUTANEOUS
  Administered 2018-03-02: 3 [IU] via SUBCUTANEOUS
  Administered 2018-03-02: 5 [IU] via SUBCUTANEOUS
  Administered 2018-03-02 – 2018-03-03 (×2): 3 [IU] via SUBCUTANEOUS
  Administered 2018-03-03: 8 [IU] via SUBCUTANEOUS
  Administered 2018-03-03: 2 [IU] via SUBCUTANEOUS
  Administered 2018-03-04 (×2): 5 [IU] via SUBCUTANEOUS
  Administered 2018-03-04 – 2018-03-05 (×2): 3 [IU] via SUBCUTANEOUS
  Administered 2018-03-05: 15 [IU] via SUBCUTANEOUS
  Administered 2018-03-05 – 2018-03-06 (×2): 8 [IU] via SUBCUTANEOUS
  Administered 2018-03-06: 3 [IU] via SUBCUTANEOUS
  Administered 2018-03-06: 8 [IU] via SUBCUTANEOUS
  Administered 2018-03-07: 5 [IU] via SUBCUTANEOUS
  Filled 2018-02-27 (×15): qty 1

## 2018-02-27 MED ORDER — IBUPROFEN 600 MG PO TABS
800.0000 mg | ORAL_TABLET | Freq: Three times a day (TID) | ORAL | Status: DC | PRN
  Administered 2018-02-27 – 2018-02-28 (×2): 800 mg via ORAL
  Filled 2018-02-27 (×2): qty 1

## 2018-02-27 MED ORDER — ACETAMINOPHEN 325 MG PO TABS
650.0000 mg | ORAL_TABLET | Freq: Four times a day (QID) | ORAL | Status: DC | PRN
  Administered 2018-02-28 – 2018-03-05 (×7): 650 mg via ORAL
  Filled 2018-02-27 (×7): qty 2

## 2018-02-27 MED ORDER — AMITRIPTYLINE HCL 10 MG PO TABS
10.0000 mg | ORAL_TABLET | Freq: Every day | ORAL | Status: DC
  Administered 2018-02-27: 10 mg via ORAL
  Filled 2018-02-27 (×2): qty 1

## 2018-02-27 MED ORDER — IBUPROFEN 200 MG PO TABS: 400.0000 mg | ORAL_TABLET | Freq: Four times a day (QID) | ORAL | Status: DC | PRN

## 2018-02-27 MED ORDER — ALUM & MAG HYDROXIDE-SIMETH 200-200-20 MG/5ML PO SUSP: 30.0000 mL | ORAL | Status: DC | PRN

## 2018-02-27 MED ORDER — IBUPROFEN 800 MG PO TABS
400.0000 mg | ORAL_TABLET | Freq: Four times a day (QID) | ORAL | Status: DC | PRN
  Administered 2018-02-27: 400 mg via ORAL
  Filled 2018-02-27: qty 1

## 2018-02-27 MED ORDER — INSULIN ASPART PROT & ASPART (70-30 MIX) 100 UNIT/ML ~~LOC~~ SUSP
30.0000 [IU] | Freq: Two times a day (BID) | SUBCUTANEOUS | Status: DC
  Administered 2018-02-27 – 2018-02-28 (×2): 30 [IU] via SUBCUTANEOUS
  Filled 2018-02-27 (×2): qty 10

## 2018-02-27 MED ORDER — HYDROXYZINE HCL 25 MG PO TABS
25.0000 mg | ORAL_TABLET | Freq: Three times a day (TID) | ORAL | Status: DC | PRN
  Administered 2018-02-27 – 2018-02-28 (×2): 25 mg via ORAL
  Filled 2018-02-27 (×2): qty 1

## 2018-02-27 NOTE — ED Notes (Signed)
Hourly rounding reveals patient in room. In no acute distress. Q15 minute rounds and monitoring via [employer] to continue.

## 2018-02-27 NOTE — ED Notes (Signed)
Patient talking to Dr. Clapacs 

## 2018-02-27 NOTE — Tx Team (Signed)
Initial Treatment Plan 02/27/2018 5:49 PM [name] [id number]    PATIENT STRESSORS: Legal issue Loss of friend Medication change or noncompliance   PATIENT STRENGTHS: Ability for insight Active sense of humor Average or above average intelligence Capable of independent living Communication skills   PATIENT IDENTIFIED PROBLEMS: Suicide 02/27/18  Depression  02/27/18  Diabetic  02/27/18  Homeless  02/27/18               DISCHARGE CRITERIA:  Ability to meet basic life and health needs Adequate post-discharge living arrangements Improved stabilization in mood, thinking, and/or behavior Medical problems require only outpatient monitoring  PRELIMINARY DISCHARGE PLAN: Outpatient therapy Placement in alternative living arrangements  PATIENT/FAMILY INVOLVEMENT: This treatment plan has been presented to and reviewed with the patient, Karen Lindsey, and/or family member,   .  The patient and family have been given the opportunity to ask questions and make suggestions.  Crist Infante, RN 02/27/2018, 5:49 PM

## 2018-02-27 NOTE — ED Notes (Signed)
Patient moving to room BHU #24

## 2018-02-27 NOTE — ED Notes (Signed)
Hourly rounding reveals patient sleeping in room. No complaints, stable, in no acute distress. Q15 minute rounds and monitoring via Security Cameras to continue. 

## 2018-02-27 NOTE — Plan of Care (Signed)
New Admission  Patient able to verbalize understanding of  information received  Problem: Education: Goal: Knowledge of [facility] Education information/materials will improve Outcome: Not Progressing
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
## 2018-02-27 NOTE — Consult Note (Signed)
[facility] Consult   Reason for Consult: Consult for this 52 year old woman who comes to the hospital with depression and suicidal thoughts Referring Physician: Rip Harbour Patient Identification: Karen Lindsey MRN:  [id number] Principal Diagnosis: Severe recurrent major depression without psychotic features [facility]) Diagnosis:   Patient Active Problem List   Diagnosis Date Noted  . Severe recurrent major depression without psychotic features ([place]) [F33.2] 02/27/2018  . Suicidal ideation [R45.851] 02/27/2018  . Diabetes ([place]) [E11.9] 02/27/2018  . Chronic pain [G89.29] 02/27/2018  . Narcotic abuse ([place]) [F11.10] 01/24/2018  . Acute delirium [R41.0] 01/24/2018  . DKA (diabetic ketoacidoses) ([place]) [E13.10] 01/23/2018  . AKI (acute kidney injury) ([place]) [N17.9] 01/23/2018  . HTN (hypertension) [I10] 01/23/2018  . Chest pain [R07.9] 11/08/2017    Total Time spent with patient: 1 hour  Subjective:   Karen Lindsey is a 52 y.o. female patient admitted with "I just do not know how I can go on".  HPI: Patient seen chart reviewed.  52 year old woman comes in saying her depression is getting worse.  For 2 months or more now she has been feeling sad down and depressed and hopeless.  Life circumstances are getting harder for her and she is getting more hopeless and anxious and feeling panicky.  Sleeps poorly at night.  Lost appetite.  Having suicidal thoughts with thoughts about somehow trying to cut herself or overdose.  Patient says that she is still taking medication that she had been prescribed for depression and other medical problems.  Major life stresses include her severe chronic pain which has gotten more intolerable since she stopped attending a methadone clinic as well as now being homeless and having almost no support.  Vaguely talks about visual hallucinations but does not appear to be psychotic.  No homicidal ideation.  Social history: Patient currently is homeless.  It is a  long story but she had previously been sharing a home with a young man and that relationship somehow ended.  She does not get any kind of check or financial assistance.  She does have children but they are not living in the area and cannot support her.  Medical history: Diabetes high blood pressure chronic neuropathy and pain.  Substance abuse history: History of opiate abuse in the past and had been attending a methadone clinic for some time but decided on her own that she was going to discontinue it a couple months ago.  This resulted in a hospitalization with some delirium.  Patient is now regretting having stopped it because she does not have good control of her pain issue.  Denies that she is been drinking or using any other drugs.  Does say she took a few OxyContin earlier this week.  Past Psychiatric History: Patient has no previous hospitalization but does have a past history of suicide attempt years ago by wrist cutting.  History of depression.  No clear history of psychosis.  Currently prescribed Cymbalta but not really seeing an outpatient psychiatric provider regularly.  Risk to Self:   Risk to Others:   Prior Inpatient Therapy:   Prior Outpatient Therapy:    Past Medical History:  Past Medical History:  Diagnosis Date  . Arthritis    rheumatoid  . Chronic pain    a. upper/mid back.  . Diabetic peripheral neuropathy ([place])   . Fatty liver   . Hypertension   . Insulin dependent diabetes mellitus ([place])    a. Dx ~ 2011.  . Narcotic addiction ([place])  a. Followed in substance abuse center in [place] - on chronic methadone.  . Neuropathy   . RA (rheumatoid arthritis) ([place])   . Tobacco abuse     Past Surgical History:  Procedure Laterality Date  . ABDOMINAL HYSTERECTOMY    . CESAREAN SECTION    . CHOLECYSTECTOMY    . KNEE SURGERY    . MANDIBLE FRACTURE SURGERY     Family History: No family history on file. Family Psychiatric  History: None known Social History:  Social  History   Substance and Sexual Activity  Alcohol Use No   Comment: Hasn't had a drink since 1998     Social History   Substance and Sexual Activity  Drug Use Yes   Comment: Previously abused pain pills. prescribed methadone     Social History   Socioeconomic History  . Marital status: Legally Separated    Spouse name: Not on file  . Number of children: Not on file  . Years of education: Not on file  . Highest education level: Not on file  Occupational History    Comment: Works part-time in Engineer, structural  Social Needs  . Financial resource strain: Not on file  . Food insecurity:    Worry: Not on file    Inability: Not on file  . Transportation needs:    Medical: Not on file    Non-medical: Not on file  Tobacco Use  . Smoking status: Current Every Day Smoker    Packs/day: 0.50    Types: Cigarettes  . Smokeless tobacco: Never Used  . Tobacco comment: 30+ years  Substance and Sexual Activity  . Alcohol use: No    Comment: Hasn't had a drink since 1998  . Drug use: Yes    Comment: Previously abused pain pills. prescribed methadone   . Sexual activity: Not on file  Lifestyle  . Physical activity:    Days per week: Not on file    Minutes per session: Not on file  . Stress: Not on file  Relationships  . Social connections:    Talks on phone: Not on file    Gets together: Not on file    Attends religious service: Not on file    Active member of club or organization: Not on file    Attends meetings of clubs or organizations: Not on file    Relationship status: Not on file  Other Topics Concern  . Not on file  Social History Narrative   Lives locally with friends/roomates.   Additional Social History:    Allergies:   Allergies  Allergen Reactions  . Ultram [Tramadol Hcl] Palpitations    Labs:  Results for orders placed or performed during the hospital encounter of 02/26/18 (from the past 48 hour(s))  Comprehensive metabolic panel     Status: Abnormal    Collection Time: 02/26/18  4:31 PM  Result Value Ref Range   Sodium 140 135 - 145 mmol/L   Potassium 4.2 3.5 - 5.1 mmol/L   Chloride 107 98 - 111 mmol/L   CO2 26 22 - 32 mmol/L   Glucose, Bld 150 (H) 70 - 99 mg/dL   BUN 16 6 - 20 mg/dL   Creatinine, Ser 0.43 (L) 0.44 - 1.00 mg/dL   Calcium 9.0 8.9 - 10.3 mg/dL   Total Protein 7.5 6.5 - 8.1 g/dL   Albumin 4.3 3.5 - 5.0 g/dL   AST 23 15 - 41 U/L   ALT 18 0 - 44 U/L  Alkaline Phosphatase 70 38 - 126 U/L   Total Bilirubin 0.7 0.3 - 1.2 mg/dL   GFR calc non Af Amer >60 >60 mL/min   GFR calc Af Amer >60 >60 mL/min    Comment: (NOTE) The eGFR has been calculated using the CKD EPI equation. This calculation has not been validated in all clinical situations. eGFR's persistently <60 mL/min signify possible Chronic Kidney Disease.    Anion gap 7 5 - 15    Comment: Performed at [facility], [address] [place], [place] [address]  Ethanol     Status: None   Collection Time: 02/26/18  4:31 PM  Result Value Ref Range   Alcohol, Ethyl (B) <10 <10 mg/dL    Comment: (NOTE) Lowest detectable limit for serum alcohol is 10 mg/dL. For medical purposes only. Performed at [facility], [address] [place], [place] [address]   Salicylate level     Status: None   Collection Time: 02/26/18  4:31 PM  Result Value Ref Range   Salicylate Lvl <2.8 2.8 - 30.0 mg/dL    Comment: Performed at [facility], [address] [place], [place] [address]  Acetaminophen level     Status: Abnormal   Collection Time: 02/26/18  4:31 PM  Result Value Ref Range   Acetaminophen (Tylenol), Serum <10 (L) 10 - 30 ug/mL    Comment: (NOTE) Therapeutic concentrations vary significantly. A range of 10-30 ug/mL  may be an effective concentration for many patients. However, some  are best treated at concentrations outside of this range. Acetaminophen concentrations >150 ug/mL at 4 hours after ingestion  and >50 ug/mL at 12 hours  after ingestion are often associated with  toxic reactions. Performed at [facility], [address] [place], [place] [address]   cbc     Status: Abnormal   Collection Time: 02/26/18  4:31 PM  Result Value Ref Range   WBC 11.6 (H) 3.6 - 11.0 K/uL   RBC 5.00 3.80 - 5.20 MIL/uL   Hemoglobin 14.4 12.0 - 16.0 g/dL   HCT 43.0 35.0 - 47.0 %   MCV 85.9 80.0 - 100.0 fL   MCH 28.9 26.0 - 34.0 pg   MCHC 33.6 32.0 - 36.0 g/dL   RDW 13.9 11.5 - 14.5 %   Platelets 277 150 - 440 K/uL    Comment: Performed at [facility], [address], [place],  [address]  Urine Drug Screen, Qualitative     Status: Abnormal   Collection Time: 02/26/18  4:31 PM  Result Value Ref Range   Tricyclic, Ur Screen POSITIVE (A) NONE DETECTED   Amphetamines, Ur Screen NONE DETECTED NONE DETECTED   MDMA (Ecstasy)Ur Screen NONE DETECTED NONE DETECTED   Cocaine Metabolite,Ur [place] NONE DETECTED NONE DETECTED   Opiate, Ur Screen NONE DETECTED NONE DETECTED   Phencyclidine (PCP) Ur S NONE DETECTED NONE DETECTED   Cannabinoid 50 Ng, Ur  NONE DETECTED NONE DETECTED   Barbiturates, Ur Screen NONE DETECTED NONE DETECTED   Benzodiazepine, Ur Scrn TEST NOT PERFORMED, REAGENT NOT AVAILABLE (A) NONE DETECTED   Methadone Scn, Ur NONE DETECTED NONE DETECTED    Comment: (NOTE) Tricyclics + metabolites, urine    Cutoff 1000 ng/mL Amphetamines + metabolites, urine  Cutoff 1000 ng/mL MDMA (Ecstasy), urine              Cutoff 500 ng/mL Cocaine Metabolite, urine          Cutoff 300 ng/mL Opiate + metabolites,  urine        Cutoff 300 ng/mL Phencyclidine (PCP), urine         Cutoff 25 ng/mL Cannabinoid, urine                 Cutoff 50 ng/mL Barbiturates + metabolites, urine  Cutoff 200 ng/mL Benzodiazepine, urine              Cutoff 200 ng/mL Methadone, urine                   Cutoff 300 ng/mL The urine drug screen provides only a preliminary, unconfirmed analytical test result and should not be used for  non-medical purposes. Clinical consideration and professional judgment should be applied to any positive drug screen result due to possible interfering substances. A more specific alternate chemical method must be used in order to obtain a confirmed analytical result. Gas chromatography / mass spectrometry (GC/MS) is the preferred confirmat ory method. Performed at [facility], [address] [place], [place] [address]   Glucose, capillary     Status: Abnormal   Collection Time: 02/26/18  8:59 PM  Result Value Ref Range   Glucose-Capillary 249 (H) 70 - 99 mg/dL  Glucose, capillary     Status: Abnormal   Collection Time: 02/27/18  2:35 AM  Result Value Ref Range   Glucose-Capillary 55 (L) 70 - 99 mg/dL  Glucose, capillary     Status: Abnormal   Collection Time: 02/27/18  2:54 AM  Result Value Ref Range   Glucose-Capillary 62 (L) 70 - 99 mg/dL  Glucose, capillary     Status: None   Collection Time: 02/27/18  3:06 AM  Result Value Ref Range   Glucose-Capillary 80 70 - 99 mg/dL  Glucose, capillary     Status: Abnormal   Collection Time: 02/27/18  3:24 AM  Result Value Ref Range   Glucose-Capillary 121 (H) 70 - 99 mg/dL  Glucose, capillary     Status: Abnormal   Collection Time: 02/27/18  4:27 AM  Result Value Ref Range   Glucose-Capillary 210 (H) 70 - 99 mg/dL  Glucose, capillary     Status: Abnormal   Collection Time: 02/27/18  5:28 AM  Result Value Ref Range   Glucose-Capillary 179 (H) 70 - 99 mg/dL  Glucose, capillary     Status: Abnormal   Collection Time: 02/27/18  7:52 AM  Result Value Ref Range   Glucose-Capillary 140 (H) 70 - 99 mg/dL  Glucose, capillary     Status: Abnormal   Collection Time: 02/27/18 11:49 AM  Result Value Ref Range   Glucose-Capillary 155 (H) 70 - 99 mg/dL    Current Facility-Administered Medications  Medication Dose Route Frequency Provider Last Rate Last Dose  . amitriptyline (ELAVIL) tablet 10 mg  10 mg Oral QHS Delman Kitten, MD    10 mg at 02/26/18 2135  . amLODipine (NORVASC) tablet 2.5 mg  2.5 mg Oral Daily Delman Kitten, MD   2.5 mg at 02/27/18 1021  . capsaicin (ZOSTRIX) 0.025 % cream   Topical BID Nena Polio, MD      . gabapentin (NEURONTIN) capsule 1,200 mg  1,200 mg Oral TID Delman Kitten, MD   1,200 mg at 02/27/18 1017  . hydrOXYzine (ATARAX/VISTARIL) tablet 25-50 mg  25-50 mg Oral TID PRN Delman Kitten, MD   50 mg at 02/26/18 2207  . ibuprofen (ADVIL,MOTRIN) tablet 400 mg  400 mg Oral Q6H PRN Kresta [place], Madie Reno, MD      .  insulin aspart (novoLOG) injection 0-15 Units  0-15 Units Subcutaneous TID WC Delman Kitten, MD   3 Units at 02/27/18 1159  . insulin aspart protamine- aspart (NOVOLOG MIX 70/30) injection 30 Units  30 Units Subcutaneous BID WC Delman Kitten, MD   30 Units at 02/27/18 0758  . lisinopril (PRINIVIL,ZESTRIL) tablet 20 mg  20 mg Oral Daily Delman Kitten, MD   20 mg at 02/27/18 1016  . ondansetron (ZOFRAN) tablet 4 mg  4 mg Oral Q8H PRN Delman Kitten, MD       Current Outpatient Medications  Medication Sig Dispense Refill  . amitriptyline (ELAVIL) 10 MG tablet Take 1 tablet (10 mg total) by mouth at bedtime. 30 tablet 0  . amLODipine (NORVASC) 2.5 MG tablet Take 1 tablet (2.5 mg total) by mouth daily. 30 tablet 0  . DULoxetine (CYMBALTA) 30 MG capsule Take 30 mg by mouth daily.    . DULoxetine (CYMBALTA) 30 MG capsule Take 1 capsule (30 mg total) by mouth daily. 30 capsule 0  . gabapentin (NEURONTIN) 400 MG capsule Take 3 capsules (1,200 mg total) by mouth 3 (three) times daily. 60 capsule 0  . hydrOXYzine (VISTARIL) 25 MG capsule Take 25-50 mg by mouth 3 (three) times daily as needed for anxiety.    . insulin aspart protamine- aspart (NOVOLOG MIX 70/30) (70-30) 100 UNIT/ML injection Inject 0.42 mLs (42 Units total) into the skin 2 (two) times daily with a meal. 10 mL 11  . lisinopril (PRINIVIL,ZESTRIL) 20 MG tablet Take 1 tablet (20 mg total) by mouth daily. 30 tablet 0  . loperamide (IMODIUM) 2 MG capsule  Take 2 capsules (4 mg total) by mouth as needed for diarrhea or loose stools. 30 capsule 0  . methocarbamol (ROBAXIN) 750 MG tablet Take 1 tablet (750 mg total) by mouth every 8 (eight) hours as needed for muscle spasms (withdrawl pain). 30 tablet 0  . mupirocin ointment (BACTROBAN) 2 % Apply topically 2 (two) times daily. 22 g 0  . ondansetron (ZOFRAN) 4 MG tablet Take 1 tablet (4 mg total) by mouth every 8 (eight) hours as needed for nausea or vomiting. 20 tablet 0  . protein supplement shake (PREMIER PROTEIN) LIQD Take 325 mLs (11 oz total) by mouth 2 (two) times daily between meals. 30 Can 0  . zolpidem (AMBIEN) 5 MG tablet Take 1 tablet (5 mg total) by mouth at bedtime as needed for sleep. 10 tablet 0    Musculoskeletal: Strength & Muscle Tone: within normal limits Gait & Station: normal Patient leans: N/A  Psychiatric Specialty Exam: Physical Exam  Nursing note and vitals reviewed. Constitutional: She appears well-developed and well-nourished.  HENT:  Head: Normocephalic and atraumatic.  Eyes: Pupils are equal, round, and reactive to light. Conjunctivae are normal.  Neck: Normal range of motion.  Cardiovascular: Regular rhythm and normal heart sounds.  Respiratory: Effort normal. No respiratory distress.  GI: Soft.  Musculoskeletal: Normal range of motion.  Neurological: She is alert.  Skin: Skin is warm and dry.  Psychiatric: Her speech is normal. She is slowed. Thought content is not paranoid. Cognition and memory are impaired. She expresses impulsivity. She exhibits a depressed mood. She expresses suicidal ideation. She expresses suicidal plans.    Review of Systems  Constitutional: Negative.   HENT: Negative.   Eyes: Negative.   Respiratory: Negative.   Cardiovascular: Negative.   Gastrointestinal: Negative.   Musculoskeletal: Negative.   Skin: Negative.   Neurological: Positive for tingling.  Chronic pain in her hands and feet probably neuropathy   Psychiatric/Behavioral: Positive for depression and suicidal ideas. Negative for hallucinations, memory loss and substance abuse. The patient is nervous/anxious and has insomnia.     Blood pressure 138/78, pulse 92, temperature 98.3 F (36.8 C), temperature source Oral, resp. rate 16, height 5' 1"  (1.549 m), weight 57.6 kg, SpO2 99 %.Body mass index is 24 kg/m.  General Appearance: Casual  Eye Contact:  Fair  Speech:  Slow  Volume:  Decreased  Mood:  Depressed  Affect:  Depressed and Tearful  Thought Process:  Coherent  Orientation:  Full (Time, Place, and Person)  Thought Content:  Logical  Suicidal Thoughts:  Yes.  with intent/plan  Homicidal Thoughts:  No  Memory:  Immediate;   Fair Recent;   Fair Remote;   Fair  Judgement:  Impaired  Insight:  Fair  Psychomotor Activity:  Decreased  Concentration:  Concentration: Fair  Recall:  [employer] of Knowledge:  Fair  Language:  Fair  Akathisia:  No  Handed:  Right  AIMS (if indicated):     Assets:  Desire for Improvement  ADL's:  Intact  Cognition:  WNL  Sleep:        Treatment Plan Summary: Daily contact with patient to assess and evaluate symptoms and progress in treatment, Medication management and Plan Patient is very sad down negative hopeless.  Not psychotic but does have suicidal ideation with very poor supports.  Has multiple factors increasing her risk of self-harm.  Suggest she meets criteria for inpatient admission for stabilization.  Patient will be continued on her current medications.  15-minute checks.  Diabetes is being managed with as needed insulin as well as her usual medicine.  Patient can be engaged in individual and group therapy full assessment and medication management to try and find an improvement in her mood.  She is agreeable to the plan.  Case reviewed with emergency room doctor and TTS.  Disposition: Recommend psychiatric Inpatient admission when medically cleared. Supportive therapy provided about  ongoing stressors.  Alethia Berthold, MD 02/27/2018 1:34 PM

## 2018-02-27 NOTE — ED Notes (Signed)
Patient talking to TTS 

## 2018-02-27 NOTE — Progress Notes (Signed)
D: MD notified of  Patient concerns  For pain management  And loose  Stools . MD also informed of  Patient refused l to take the sliding scale  At dinner  Fearing that she would drop as she did  Last night . A: Patient has not showed  Writer any loose stools  R: Patient  Resting quietly at present

## 2018-02-27 NOTE — ED Provider Notes (Signed)
-----------------------------------------   3:07 AM on 02/27/2018 -----------------------------------------   Blood pressure 129/79, pulse 76, temperature 98.4 F (36.9 C), temperature source Oral, resp. rate 18, height 5\' 1"  (1.549 m), weight 57.6 kg, SpO2 97 %.  The patient had no acute events since last update.  Was notified by nursing that the patient is hypoglycemic.  Apparently she got 32 units of 70/30 insulin this evening without eating dinner.  Her blood sugar subsequently dropped to 50 and she then ate a large amount of carbs and is up to 62 and then 80 now.  She is asymptomatic.  We will continue to monitor.     , MD 02/27/18 [phone]

## 2018-02-27 NOTE — ED Provider Notes (Signed)
-----------------------------------------   8:17 AM on 02/27/2018 -----------------------------------------   Blood pressure 129/79, pulse 76, temperature 98.4 F (36.9 C), temperature source Oral, resp. rate 18, height 5\' 1"  (1.549 m), weight 57.6 kg, SpO2 97 %.  The patient had no acute events since last update.  Calm and cooperative at this time.  Disposition is pending Psychiatry/Behavioral Medicine team recommendations.     , MD 02/27/18 [phone]

## 2018-02-27 NOTE — BH Assessment (Signed)
Patient is to be admitted to [facility] by Dr. Toni Amend.  Attending Physician will be Dr. Johnella Moloney.   Patient has been assigned to room 323, by [facility] Charge Nurse T'Yawn.   ER staff is aware of the admission:  Misty Stanley, ER Kathrynn Speed   Dr. Darnelle Catalan, ER MD   Geralynn Ochs, Patient's Nurse   Ethelene Browns, Patient Access.

## 2018-02-27 NOTE — Progress Notes (Signed)
Admission Note: Report from [facility] in Emergency Room  D: Pt appeared depressed  With  a flat affect.  Pt  denies SI / AVH at this time.  Police brought patient  From [facility]  With suicidal ideations. Patient  Has stopped taking methadone. Periods of anxiousness  Sleeping poorly  Poor appetite . Currently patient is homeless Pt is redirectable and cooperative with assessment.  Patient is diabetic and high blood pressure. Chronic pain      A: Pt admitted to unit per protocol, skin assessment and search done and no contraband found. With Lorenda Hatchet RN Pt  educated on therapeutic milieu rules. Pt was introduced to milieu by nursing staff.    R: Pt was receptive to education about the milieu .  15 min safety checks started. Clinical research associate offered support

## 2018-02-27 NOTE — ED Notes (Signed)
Pt. Given graham crackers and peanut butter as well as non diet drink.

## 2018-02-28 DIAGNOSIS — F332 Major depressive disorder, recurrent severe without psychotic features: Principal | ICD-10-CM

## 2018-02-28 LAB — LIPID PANEL
CHOL/HDL RATIO: 5.4 ratio
CHOLESTEROL: 223 mg/dL — AB (ref 0–200)
HDL: 41 mg/dL (ref >40)
LDL Cholesterol: 147 mg/dL — ABNORMAL HIGH (ref 0–99)
TRIGLYCERIDES: 173 mg/dL — AB (ref <150)
VLDL: 35 mg/dL (ref 0–40)

## 2018-02-28 LAB — HEMOGLOBIN A1C
Hgb A1c MFr Bld: 10.2 % — ABNORMAL HIGH (ref 4.8–5.6)
Mean Plasma Glucose: 246.04 mg/dL

## 2018-02-28 LAB — GLUCOSE, CAPILLARY
Glucose-Capillary: 230 mg/dL — ABNORMAL HIGH (ref 70–99)
Glucose-Capillary: 289 mg/dL — ABNORMAL HIGH (ref 70–99)
Glucose-Capillary: 123 mg/dL — ABNORMAL HIGH (ref 70–99)

## 2018-02-28 LAB — TSH: TSH: 1.183 u[IU]/mL (ref 0.350–4.500)

## 2018-02-28 MED ORDER — DULOXETINE HCL 30 MG PO CPEP
60.0000 mg | ORAL_CAPSULE | Freq: Every day | ORAL | Status: DC
  Administered 2018-02-28 – 2018-03-07 (×8): 60 mg via ORAL
  Filled 2018-02-28 (×8): qty 2

## 2018-02-28 MED ORDER — KETOROLAC TROMETHAMINE 30 MG/ML IJ SOLN: 30.0000 mg | Freq: Four times a day (QID) | INTRAMUSCULAR | Status: DC | PRN

## 2018-02-28 MED ORDER — KETOROLAC TROMETHAMINE 30 MG/ML IJ SOLN
30.0000 mg | Freq: Four times a day (QID) | INTRAMUSCULAR | Status: AC | PRN
  Administered 2018-02-28 – 2018-03-04 (×8): 30 mg via INTRAMUSCULAR
  Filled 2018-02-28 (×9): qty 1

## 2018-02-28 MED ORDER — TRAZODONE HCL 50 MG PO TABS
150.0000 mg | ORAL_TABLET | Freq: Every evening | ORAL | Status: DC | PRN
  Administered 2018-02-28 – 2018-03-02 (×3): 150 mg via ORAL
  Filled 2018-02-28 (×3): qty 1

## 2018-02-28 MED ORDER — LIDOCAINE 5 % EX PTCH
1.0000 | MEDICATED_PATCH | CUTANEOUS | Status: DC
  Administered 2018-02-28 – 2018-03-07 (×8): 1 via TRANSDERMAL
  Filled 2018-02-28 (×9): qty 1

## 2018-02-28 MED ORDER — INSULIN ASPART PROT & ASPART (70-30 MIX) 100 UNIT/ML ~~LOC~~ SUSP
33.0000 [IU] | Freq: Two times a day (BID) | SUBCUTANEOUS | Status: DC
  Administered 2018-02-28 – 2018-03-01 (×2): 33 [IU] via SUBCUTANEOUS
  Filled 2018-02-28 (×2): qty 10

## 2018-02-28 MED ORDER — HYDROXYZINE HCL 50 MG PO TABS
50.0000 mg | ORAL_TABLET | Freq: Three times a day (TID) | ORAL | Status: DC | PRN
  Administered 2018-02-28 – 2018-03-06 (×9): 50 mg via ORAL
  Filled 2018-02-28 (×11): qty 1

## 2018-02-28 NOTE — [facility] Group Notes (Signed)
[facility] Group Notes:  (Nursing/MHT/Case Management/Adjunct)  Date:  02/28/2018  Time:  9:43 PM  Type of Therapy:  Group Therapy  Participation Level:  Did Not Attend    [name] 02/28/2018, 9:43 PM

## 2018-02-28 NOTE — Tx Team (Addendum)
Interdisciplinary Treatment and Diagnostic Plan Update  02/28/2018 Time of Session: 11:10 AM Karen Lindsey MRN: [id number]  Principal Diagnosis: Severe recurrent major depression without psychotic features (HCC)  Secondary Diagnoses: Principal Problem:   Severe recurrent major depression without psychotic features (HCC)   Current Medications:  Current Facility-Administered Medications  Medication Dose Route Frequency Provider Last Rate Last Dose  . acetaminophen (TYLENOL) tablet 650 mg  650 mg Oral Q6H PRN Clapacs, John T, MD      . alum & mag hydroxide-simeth (MAALOX/MYLANTA) 200-200-20 MG/5ML suspension 30 mL  30 mL Oral Q4H PRN Clapacs, John T, MD      . amLODipine (NORVASC) tablet 2.5 mg  2.5 mg Oral Daily Clapacs, Jackquline Denmark, MD   2.5 mg at 02/28/18 0837  . DULoxetine (CYMBALTA) DR capsule 60 mg  60 mg Oral Daily McNew, Ileene Hutchinson, MD   60 mg at 02/28/18 1221  . gabapentin (NEURONTIN) capsule 1,200 mg  1,200 mg Oral TID Clapacs, John T, MD   1,200 mg at 02/28/18 1222  . hydrOXYzine (ATARAX/VISTARIL) tablet 50 mg  50 mg Oral TID PRN Haskell Riling, MD   50 mg at 02/28/18 1228  . insulin aspart (novoLOG) injection 0-15 Units  0-15 Units Subcutaneous TID WC Clapacs, Jackquline Denmark, MD   5 Units at 02/28/18 [phone]  . insulin aspart protamine- aspart (NOVOLOG MIX 70/30) injection 33 Units  33 Units Subcutaneous BID WC McNew, Holly R, MD      . ketorolac (TORADOL) 30 MG/ML injection 30 mg  30 mg Intramuscular Q6H PRN McNew, Ileene Hutchinson, MD   30 mg at 02/28/18 1222  . lidocaine (LIDODERM) 5 % 1 patch  1 patch Transdermal Q24H Haskell Riling, MD   1 patch at 02/28/18 1223  . lisinopril (PRINIVIL,ZESTRIL) tablet 20 mg  20 mg Oral Daily Clapacs, Jackquline Denmark, MD   20 mg at 02/28/18 0837  . loperamide (IMODIUM) capsule 2 mg  2 mg Oral PRN Clapacs, John T, MD      . magnesium hydroxide (MILK OF MAGNESIA) suspension 30 mL  30 mL Oral Daily PRN Clapacs, John T, MD      . traZODone (DESYREL) tablet 150 mg  150 mg Oral QHS PRN  McNew, Ileene Hutchinson, MD       PTA Medications: Medications Prior to Admission  Medication Sig Dispense Refill Last Dose  . amitriptyline (ELAVIL) 10 MG tablet Take 1 tablet (10 mg total) by mouth at bedtime. 30 tablet 0   . amLODipine (NORVASC) 2.5 MG tablet Take 1 tablet (2.5 mg total) by mouth daily. 30 tablet 0   . DULoxetine (CYMBALTA) 30 MG capsule Take 30 mg by mouth daily.   unknown at unknown  . DULoxetine (CYMBALTA) 30 MG capsule Take 1 capsule (30 mg total) by mouth daily. 30 capsule 0   . gabapentin (NEURONTIN) 400 MG capsule Take 3 capsules (1,200 mg total) by mouth 3 (three) times daily. 60 capsule 0   . hydrOXYzine (VISTARIL) 25 MG capsule Take 25-50 mg by mouth 3 (three) times daily as needed for anxiety.   PRN at PRN  . insulin aspart protamine- aspart (NOVOLOG MIX 70/30) (70-30) 100 UNIT/ML injection Inject 0.42 mLs (42 Units total) into the skin 2 (two) times daily with a meal. 10 mL 11   . lisinopril (PRINIVIL,ZESTRIL) 20 MG tablet Take 1 tablet (20 mg total) by mouth daily. 30 tablet 0   . loperamide (IMODIUM) 2 MG capsule Take 2 capsules (4 mg  total) by mouth as needed for diarrhea or loose stools. 30 capsule 0   . methocarbamol (ROBAXIN) 750 MG tablet Take 1 tablet (750 mg total) by mouth every 8 (eight) hours as needed for muscle spasms (withdrawl pain). 30 tablet 0   . mupirocin ointment (BACTROBAN) 2 % Apply topically 2 (two) times daily. 22 g 0   . ondansetron (ZOFRAN) 4 MG tablet Take 1 tablet (4 mg total) by mouth every 8 (eight) hours as needed for nausea or vomiting. 20 tablet 0   . protein supplement shake (PREMIER PROTEIN) LIQD Take 325 mLs (11 oz total) by mouth 2 (two) times daily between meals. 30 Can 0   . zolpidem (AMBIEN) 5 MG tablet Take 1 tablet (5 mg total) by mouth at bedtime as needed for sleep. 10 tablet 0     Patient Stressors: Legal issue Loss of friend Medication change or noncompliance  Patient Strengths: Ability for insight Active sense of  humor Average or above average intelligence Capable of independent living Communication skills  Treatment Modalities: Medication Management, Group therapy, Case management,  1 to 1 session with clinician, Psychoeducation, Recreational therapy.   Physician Treatment Plan for Primary Diagnosis: Severe recurrent major depression without psychotic features (HCC) Long Term Goal(s): Improvement in symptoms so as ready for discharge   Short Term Goals: Ability to disclose and discuss suicidal ideas  Medication Management: Evaluate patient's response, side effects, and tolerance of medication regimen.  Therapeutic Interventions: 1 to 1 sessions, Unit Group sessions and Medication administration.  Evaluation of Outcomes: Progressing  Physician Treatment Plan for Secondary Diagnosis: Principal Problem:   Severe recurrent major depression without psychotic features (HCC)  Long Term Goal(s): Improvement in symptoms so as ready for discharge   Short Term Goals: Ability to disclose and discuss suicidal ideas     Medication Management: Evaluate patient's response, side effects, and tolerance of medication regimen.  Therapeutic Interventions: 1 to 1 sessions, Unit Group sessions and Medication administration.  Evaluation of Outcomes: Progressing   RN Treatment Plan for Primary Diagnosis: Severe recurrent major depression without psychotic features (HCC) Long Term Goal(s): Knowledge of disease and therapeutic regimen to maintain health will improve  Short Term Goals: Ability to remain free from injury will improve, Ability to participate in decision making will improve, Ability to disclose and discuss suicidal ideas and Compliance with prescribed medications will improve  Medication Management: RN will administer medications as ordered by provider, will assess and evaluate patient's response and provide education to patient for prescribed medication. RN will report any adverse and/or side  effects to prescribing provider.  Therapeutic Interventions: 1 on 1 counseling sessions, Psychoeducation, Medication administration, Evaluate responses to treatment, Monitor vital signs and CBGs as ordered, Perform/monitor CIWA, COWS, AIMS and Fall Risk screenings as ordered, Perform wound care treatments as ordered.  Evaluation of Outcomes: Progressing   LCSW Treatment Plan for Primary Diagnosis: Severe recurrent major depression without psychotic features (HCC) Long Term Goal(s): Safe transition to appropriate next level of care at discharge, Engage patient in therapeutic group addressing interpersonal concerns.  Short Term Goals: Engage patient in aftercare planning with referrals and resources and Increase skills for wellness and recovery  Therapeutic Interventions: Assess for all discharge needs, 1 to 1 time with Social worker, Explore available resources and support systems, Assess for adequacy in community support network, Educate family and significant other(s) on suicide prevention, Complete Psychosocial Assessment, Interpersonal group therapy.  Evaluation of Outcomes: Progressing   Progress in Treatment: Attending groups: No. Participating  in groups: No. Taking medication as prescribed: Yes. Toleration medication: Yes. Family/Significant other contact made: No, will contact:  CSW given consent to contact pt's friend, Bettina Gavia. Patient understands diagnosis: Yes. Discussing patient identified problems/goals with staff: Yes. Medical problems stabilized or resolved: Yes. Denies suicidal/homicidal ideation: Yes. Issues/concerns per patient self-inventory: No. Other: n/a  New problem(s) identified: No, Describe:  No new problems identified  New Short Term/Long Term Goal(s):  Patient Goals:  "to get everything figured out, deal with it, and get back with my kids"  Discharge Plan or Barriers: Tentative discharge plan is for pt to either go to live with a friend with  outpatient services with [facility] or to go to a SA residential program for women along with MAT (Suboxone/Subutex).  Reason for Continuation of Hospitalization: Anxiety Depression Medication stabilization  Estimated Length of Stay: 5-7 days   Recreational Therapy: Patient Stressors: Family, Relationship Patient Goal: Patient will engage in groups without prompting or encouragement from LRT x3 group sessions within 5 recreation therapy group sessions  Attendees: Patient: Karen Lindsey 02/28/2018 5:13 PM  Physician: Corinna Gab, MD 02/28/2018 5:13 PM  Nursing: Hulan Amato, RN 02/28/2018 5:13 PM  RN Care Manager: 02/28/2018 5:13 PM  Social Worker: Huey Romans, LCSW 02/28/2018 5:13 PM  Recreational Therapist:  02/28/2018 5:13 PM  Other: Johny Shears, LCSWA 02/28/2018 5:13 PM  Other:  02/28/2018 5:13 PM  Other: 02/28/2018 5:13 PM    Scribe for Treatment Team: Alease Frame, LCSW 02/28/2018 5:13 PM

## 2018-02-28 NOTE — [facility] Group Notes (Signed)
LCSW Group Therapy Note  02/28/2018 1:00 pm  Type of Therapy/Topic:  Group Therapy:  Emotion Regulation  Participation Level:  Did Not Attend   Description of Group:    The purpose of this group is to assist patients in learning to regulate negative emotions and experience positive emotions. Patients will be guided to discuss ways in which they have been vulnerable to their negative emotions. These vulnerabilities will be juxtaposed with experiences of positive emotions or situations, and patients will be challenged to use positive emotions to combat negative ones. Special emphasis will be placed on coping with negative emotions in conflict situations, and patients will process healthy conflict resolution skills.  Therapeutic Goals: 1. Patient will identify two positive emotions or experiences to reflect on in order to balance out negative emotions 2. Patient will label two or more emotions that they find the most difficult to experience 3. Patient will demonstrate positive conflict resolution skills through discussion and/or role plays  Summary of Patient Progress:  Marcele was invited to today's group, but chose not to attend.     Therapeutic Modalities:   Cognitive Behavioral Therapy Feelings Identification Dialectical Behavioral Therapy

## 2018-02-28 NOTE — Plan of Care (Signed)
Miserably presented with flat mood and affect, visible and mostly in the hallway, c/o neuropathy pain 8/10, Ibuprofen 800 mg PO PRN and Topical Capsaicin provided; Trazodone 100 mg and 25 mg Vistaril given at bedtime; denied SI/HI/AVH, uncertain of where she will be going after discharge.  Patient slept for Estimated Hours of 6.0; Precautionary checks every 15 minutes for safety maintained, room free of safety hazards, patient sustains no injury or falls during this shift.  Problem: Coping: Goal: Coping ability will improve Outcome: Progressing   Problem: Medication: Goal: Compliance with prescribed medication regimen will improve Outcome: Progressing   Problem: Education: Goal: Emotional status will improve Outcome: Progressing Goal: Mental status will improve Outcome: Progressing Goal: Verbalization of understanding the information provided will improve Outcome: Progressing
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
## 2018-02-28 NOTE — Progress Notes (Signed)
Inpatient Diabetes Program Recommendations  AACE/ADA: New Consensus Statement on Inpatient Glycemic Control (2019)  Target Ranges:  Prepandial:   less than 140 mg/dL      Peak postprandial:   less than 180 mg/dL (1-2 hours)      Critically ill patients:  140 - 180 mg/dL  Results for Karen Lindsey, Karen Lindsey (MRN [id number]) as of 02/28/2018 11:49  Ref. Range 02/27/2018 07:52 02/27/2018 11:49 02/27/2018 16:12 02/27/2018 20:58 02/28/2018 07:02  Glucose-Capillary Latest Ref Range: 70 - 99 mg/dL 607 (H) 371 (H) 062 (H) 181 (H) 230 (H)   Results for Karen Lindsey, Karen Lindsey (MRN [id number]) as of 02/28/2018 11:49  Ref. Range 11/08/2017 22:39 02/28/2018 06:57  Hemoglobin A1C Latest Ref Range: 4.8 - 5.6 % 11.4 (H) 10.2 (H)   Review of Glycemic Control  Diabetes history: DM2 Outpatient Diabetes medications: 70/30 42 units BID Current orders for Inpatient glycemic control: 70/30 30 units BID, Novolog 0-15 units TID with meals  Inpatient Diabetes Program Recommendations:  Insulin - Basal: Please consider increasing 70/30 to 33 units BID. HgbA1C: A1C 10.2% on 02/28/18 indicating an average glucose of 246 mg/dl. Prior A1C was 11.4% on 11/08/17. Inpatient Diabetes team spoke with patient on 01/26/18 during prior hospitalization regarding DM control (see note for details).  Thanks, Orlando Penner, RN, MSN, CDE Diabetes Coordinator Inpatient Diabetes Program [phone] (Team Pager from 8am to 5pm)

## 2018-02-28 NOTE — Progress Notes (Signed)
Received Karen Lindsey during lunch,she forgot to have her BS check prior to taking her meal. She was medicated per order and received her Toradol  injection for pain. She rated her pain 9/10. The medication was effective and her pain decreased to 6/10. She continues to endorse feeling anxious and related it to poor sleep. She returned to her room for a nap. No change in her status this PM.

## 2018-03-01 LAB — GLUCOSE, CAPILLARY
GLUCOSE-CAPILLARY: 259 mg/dL — AB (ref 70–99)
GLUCOSE-CAPILLARY: 232 mg/dL — AB (ref 70–99)
Glucose-Capillary: 235 mg/dL — ABNORMAL HIGH (ref 70–99)
Glucose-Capillary: 134 mg/dL — ABNORMAL HIGH (ref 70–99)
Glucose-Capillary: 255 mg/dL — ABNORMAL HIGH (ref 70–99)

## 2018-03-01 MED ORDER — INSULIN ASPART PROT & ASPART (70-30 MIX) 100 UNIT/ML ~~LOC~~ SUSP
25.0000 [IU] | Freq: Once | SUBCUTANEOUS | Status: AC
  Administered 2018-03-01: 25 [IU] via SUBCUTANEOUS
  Filled 2018-03-01 (×2): qty 10

## 2018-03-01 MED ORDER — INSULIN ASPART PROT & ASPART (70-30 MIX) 100 UNIT/ML ~~LOC~~ SUSP
38.0000 [IU] | Freq: Two times a day (BID) | SUBCUTANEOUS | Status: DC
  Administered 2018-03-02: 38 [IU] via SUBCUTANEOUS
  Filled 2018-03-01 (×2): qty 10

## 2018-03-01 NOTE — [facility] Group Notes (Signed)
LCSW Group Therapy Note  03/01/2018 1:00pm  Type of Therapy/Topic:  Group Therapy:  Balance in Life  Participation Level:  Active  Description of Group:    This group will address the concept of balance and how it feels and looks when one is unbalanced. Patients will be encouraged to process areas in their lives that are out of balance and identify reasons for remaining unbalanced. Facilitators will guide patients in utilizing problem-solving interventions to address and correct the stressor making their life unbalanced. Understanding and applying boundaries will be explored and addressed for obtaining and maintaining a balanced life. Patients will be encouraged to explore ways to assertively make their unbalanced needs known to significant others in their lives, using other group members and facilitator for support and feedback.  Therapeutic Goals: 1. Patient will identify two or more emotions or situations they have that consume much of in their lives. 2. Patient will identify signs/triggers that life has become out of balance:  3. Patient will identify two ways to set boundaries in order to achieve balance in their lives:  4. Patient will demonstrate ability to communicate their needs through discussion and/or role plays  Summary of Patient Progress:  Karen Lindsey actively participated in today's group discussion on balance in life.  Karen Lindsey identified two emotions that have consumed much of her life as sadness and anger.  Karen Lindsey shared that one sign that her life is out of balance is when she becomes "snappy" at others for no good reason.  Karen Lindsey shared that two things that she does to help her achieve more balance in her life is to walk away from conflicts as well as remind something that her grandmother told her regarding how she should live her life.    Therapeutic Modalities:   Cognitive Behavioral Therapy Solution-Focused Therapy Assertiveness Training  Alease Frame, [place] 03/01/2018 4:49  PM

## 2018-03-01 NOTE — Progress Notes (Signed)
Recreation Therapy Notes  Date: 03/01/2018  Time: 9:30 am   Location: Craft Room   Behavioral response: N/A   Intervention Topic: Happiness  Discussion/Intervention: Patient did not attend group.   Clinical Observations/Feedback:  Patient did not attend group.   Paisley Grajeda LRT/CTRS        Callen Vancuren 03/01/2018 11:26 AM 

## 2018-03-01 NOTE — [facility] Group Notes (Signed)
[facility] Group Notes:  (Nursing/MHT/Case Management/Adjunct)  Date:  03/01/2018  Time:  11:33 PM  Type of Therapy:  Group Therapy  Participation Level:  Active  Participation Quality:  Appropriate  Affect:  Appropriate  Cognitive:  Appropriate  Insight:  Appropriate  Engagement in Group:  Engaged  Modes of Intervention:  Discussion  Summary of Progress/Problems: Karen Lindsey was present for group. Karen Lindsey stated her goal was to rest today. Karen Lindsey stated she did not accomplish this because she got up for group.  [name] 03/01/2018, 11:33 PM

## 2018-03-01 NOTE — Plan of Care (Signed)
Patient has the ability to cope and has been observed attending and participating in unit groups without any issues . Patient verbalizes understanding of and has been in compliance with her prescribed medication regimen and all questions/concerns have been addressed and answered at this time. Patient verbalizes understanding of the general information that's been provided to her and has not voiced any further questions at this time. Patient denies SI/HI/AVH, however, she does endorse depression and anxiety, rating them both a "10/10" stating that her pain is making her depressed and "some of the things I'm going through" is making her anxious. Patient has been free from injury thus far and remains safe on the unit at this time.  Problem: Coping: Goal: Coping ability will improve Outcome: Progressing   Problem: Medication: Goal: Compliance with prescribed medication regimen will improve Outcome: Progressing   Problem: Education: Goal: Knowledge of [facility] Education information/materials will improve Outcome: Progressing Goal: Emotional status will improve Outcome: Progressing Goal: Mental status will improve Outcome: Progressing Goal: Verbalization of understanding the information provided will improve Outcome: Progressing   Problem: Safety: Goal: Ability to disclose and discuss suicidal ideas will improve Outcome: Progressing Goal: Ability to identify and utilize support systems that promote safety will improve Outcome: Progressing   Problem: Safety: Goal: Ability to remain free from injury will improve Outcome: Progressing

## 2018-03-01 NOTE — Progress Notes (Signed)
Patient agreed to take the one time dose of Novolog 70/30, 25 units.

## 2018-03-01 NOTE — Plan of Care (Signed)
Sad, flat, miserable affect and mood, continues to verbalize uncontrolled pain and seeking Toradol 30 mg, she was not happy when informed that it is too soon to give; she received Trazodone 150 mg and Vistaril 50  Mg @ bedtime; she denied SI/HI/AVH.  Patient slept for Estimated Hours of 7.15; Precautionary checks every 15 minutes for safety maintained, room free of safety hazards, patient sustains no injury or falls during this shift.  Problem: Medication: Goal: Compliance with prescribed medication regimen will improve Outcome: Progressing   Problem: Education: Goal: Emotional status will improve Outcome: Progressing Goal: Mental status will improve Outcome: Progressing   Problem: Safety: Goal: Ability to disclose and discuss suicidal ideas will improve Outcome: Progressing   Problem: Safety: Goal: Ability to remain free from injury will improve Outcome: Progressing

## 2018-03-01 NOTE — Progress Notes (Signed)
Inpatient Diabetes Program Recommendations  AACE/ADA: New Consensus Statement on Inpatient Glycemic Control (2015)  Target Ranges:  Prepandial:   less than 140 mg/dL      Peak postprandial:   less than 180 mg/dL (1-2 hours)      Critically ill patients:  140 - 180 mg/dL   Results for KASSIE, KENG (MRN [id number]) as of 03/01/2018 13:15  Ref. Range 03/01/2018 07:01 03/01/2018 11:32  Glucose-Capillary Latest Ref Range: 70 - 99 mg/dL 573 (H) 220 (H)    Home DM Meds: 70/30 Insulin- 42 units BID  Current Insulin orders: 70/30 Insulin- 33 units BID      Novolog Moderate Correction Scale/ SSI (0-15 units) TID AC      MD- Please consider increasing 70/30 Insulin to 38 units BID with meals     --Will follow patient during hospitalization--  Ambrose Finland RN, MSN, CDE Diabetes Coordinator Inpatient Glycemic Control Team Team Pager: [phone] (8a-5p)
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
## 2018-03-01 NOTE — Progress Notes (Signed)
Patient ID: Karen Lindsey, female   DOB: Nov 08, 1965, 52 y.o.   MRN: [id number] CSW attempted to contact [facility], [place] [phone]), but was unable to reach him.  CSW left a VM with request for callback.  CSW attempted to contact someone at [facility] (homeless shelter for women in [place], [place]), to inquire about available beds and admission criteria.  CSW was unable to reach anyone.  CSW left a VM with request for callback.  CSW contacted the following agencies to inquire about their Suboxone/Subutex programs: 1.  [facility] at [phone] informed that they do have a Subutex program that requires daily dosing and participation in their IOP group.  The agency utilizes a sliding scale fee that is income based.  If someone is interested in the program they must come to the agency's office and meet with Council Mechanic, who is the MAT Therapist, sports.  He is available Mon-Friday between the hours of 9A-4P.  Council Mechanic will conduct a triage interview and will decide if someone is appropriate for the services.  If appropriate an intake/assessment appointment will be scheduled.  2.  [facility]-CS[facility] informed that anyone interested in the Suboxone program who does not have medical insurance can be accepted into the program using a sliding fee scale based on income.  If client is deemed appropriate for services, following an assessment, then the client will be placed on their waitlist which has about 48 people on it currently.  3.  [facility] ([place], [place] office at [phone] informed that if a person has no medical insurance they can still receive Suboxone services.  They must first walk-in and have an assessment.  If deemed appropriate for the program they must also participated int he IOP which meets 3X week for 12 weeks.  The client will be given a prescription that they can get filled at the pharmacy at [facility].  They do have a medication assistance  program to assist with the costs of the medication.  4.  [facility] office at [facility] agency provides both Suboxone and Subutex, but Subutex is only given to clients who are allergic to Suboxone or who are pregnant.  The agency uses a sliding scale fee if a client does not have medical insurance.  They will be required to have a letter of support from someone (can be someone who is providing them any financial support).  Following an assessment and deemed appropriate for the program the client will receive a prescription for the Suboxone.  They client is responsible for the cost of the prescription.  They did not mention providing any assistance with payment of the medication.  CSW will continue to seek housing options and treatment options for pt as a part of her discharge plan.

## 2018-03-01 NOTE — Progress Notes (Signed)
D- Patient alert and oriented. Patient presents in a pleasant mood on assessment stating that she slept "on and off" last night. Patient rated her pain level a "9/10" reporting that her "feet, legs, hand, and back" are hurting. Patient also states that she has a headache. Patient did request pain medication from this writer. Pain rates her depression and anxiety a "10/10" stating that "the pain" is making her depressed and "some of the things I'm going through" is making her anxious. Patient denies SI, HI, AVH stating to this writer "not this morning". Patient's goal for today is for "the pain to get better".  A- Scheduled medications administered to patient, per MD orders. Support and encouragement provided.  Routine safety checks conducted every 15 minutes.  Patient informed to notify staff with problems or concerns.  R- No adverse drug reactions noted. Patient contracts for safety at this time. Patient compliant with medications and treatment plan. Patient receptive, calm, and cooperative. Patient interacts well with others on the unit.  Patient remains safe at this time.

## 2018-03-01 NOTE — Progress Notes (Signed)
Patient refused her Novolog 70/30, 38 units, because her CBG was 134 and she was afraid of bottoming out because "that happened the other night". MD notified and he suggested a one-time order of Novolog 70/30, 25 units, if patient agrees.

## 2018-03-01 NOTE — Progress Notes (Signed)
Recreation Therapy Notes  INPATIENT RECREATION THERAPY ASSESSMENT  Patient Details Name: Karen Lindsey MRN: [id number] DOB: 1966/01/17 Today's Date: 03/01/2018       Information Obtained From: Chart Review  Able to Participate in Assessment/Interview: Yes  Patient Presentation: Responsive  Reason for Admission (Per Patient): Active Symptoms(depression)  Patient Stressors: Relationship, Family  Coping Skills:   Art  Leisure Interests (2+):  Art - Paint(Ridding horses)  Frequency of Recreation/Participation: Monthly  Awareness of Community Resources:     [employer]:     Current Use:    If no, Barriers?:    Expressed Interest in [employer] Information:    [place] of Residence:  Systems developer  Patient Main Form of Transportation:    Patient Strengths:     Patient Identified Areas of Improvement:     Patient Goal for Hospitalization:     Current SI (including self-harm):     Current HI:     Current AVH:    Staff Intervention Plan: Group Attendance, Collaborate with Interdisciplinary Treatment Team  Consent to Intern Participation: N/A  Karen Lindsey 03/01/2018, 4:12 PM

## 2018-03-02 ENCOUNTER — Encounter: Payer: Self-pay | Admitting: Psychiatry

## 2018-03-02 DIAGNOSIS — F17200 Nicotine dependence, unspecified, uncomplicated: Secondary | ICD-10-CM | POA: Diagnosis present

## 2018-03-02 LAB — GLUCOSE, CAPILLARY
GLUCOSE-CAPILLARY: 135 mg/dL — AB (ref 70–99)
Glucose-Capillary: 202 mg/dL — ABNORMAL HIGH (ref 70–99)
Glucose-Capillary: 200 mg/dL — ABNORMAL HIGH (ref 70–99)
Glucose-Capillary: 181 mg/dL — ABNORMAL HIGH (ref 70–99)

## 2018-03-02 MED ORDER — CANAGLIFLOZIN 100 MG PO TABS: 100.0000 mg | ORAL_TABLET | Freq: Every day | ORAL | Status: DC

## 2018-03-02 MED ORDER — CYCLOBENZAPRINE HCL 10 MG PO TABS
5.0000 mg | ORAL_TABLET | Freq: Three times a day (TID) | ORAL | Status: DC | PRN
  Administered 2018-03-02 – 2018-03-06 (×11): 5 mg via ORAL
  Filled 2018-03-02 (×10): qty 1

## 2018-03-02 MED ORDER — OCUVITE-LUTEIN PO CAPS
1.0000 | ORAL_CAPSULE | Freq: Every day | ORAL | Status: DC
  Administered 2018-03-03 – 2018-03-07 (×5): 1 via ORAL
  Filled 2018-03-02 (×5): qty 1

## 2018-03-02 MED ORDER — KETOROLAC TROMETHAMINE 10 MG PO TABS
10.0000 mg | ORAL_TABLET | Freq: Four times a day (QID) | ORAL | Status: DC | PRN
  Administered 2018-03-03 – 2018-03-07 (×7): 10 mg via ORAL
  Filled 2018-03-02 (×10): qty 1

## 2018-03-02 MED ORDER — INSULIN ASPART PROT & ASPART (70-30 MIX) 100 UNIT/ML ~~LOC~~ SUSP
35.0000 [IU] | Freq: Two times a day (BID) | SUBCUTANEOUS | Status: DC
  Administered 2018-03-02 – 2018-03-05 (×6): 35 [IU] via SUBCUTANEOUS
  Filled 2018-03-02 (×3): qty 10

## 2018-03-02 MED ORDER — CALCIUM CARBONATE-VITAMIN D 500-200 MG-UNIT PO TABS
2.0000 | ORAL_TABLET | Freq: Every day | ORAL | Status: DC
  Administered 2018-03-03 – 2018-03-07 (×5): 2 via ORAL
  Filled 2018-03-02 (×6): qty 2

## 2018-03-02 NOTE — Progress Notes (Addendum)
D- Patient alert and oriented. Patient presents in a pleasant mood on assessment stating that she slept "pretty good" last night with continued complaints of pain. Patient rated her pain a "9/10" stating that she is hurting "everywhere". Patient did request pain medication from this writer. Patient denies SI, HI, AVH, at this time. Patient rates her depression a "8/10" and her anxiety a "10/10" reporting to this writer that "the pain" is why she's feeling this way. Patient's goal for today is "to get some sleep and stay off my legs. Find out some information from my doctor and social worker", in which she will "rest" in order to meet this goal.  A- Scheduled medications administered to patient, per MD orders. Support and encouragement provided.  Routine safety checks conducted every 15 minutes.  Patient informed to notify staff with problems or concerns.  R- No adverse drug reactions noted. Patient contracts for safety at this time. Patient compliant with medications and treatment plan. Patient receptive, calm, and cooperative. Patient interacts well with others on the unit.  Patient remains safe at this time.

## 2018-03-02 NOTE — Progress Notes (Signed)
Pharmacy called and stated that they don not carry patient's prescribed Invokana that is to start tomorrow morning.

## 2018-03-02 NOTE — Progress Notes (Signed)
Patient ID: Karen Lindsey, female   DOB: [birth year], 52 y.o.   MRN: [id number] CSW contacted Mr. Lyndel Safe with [facility] to inquire if he had any open beds.  Mr. Lyndel Safe informed CSW that he did not have any open beds,b ut that he would be having 3 beds available the first of September 2019. CSW attempted to contact [facility] again, but had to leave another VM with request for callback.  CSW met with pt to inform her of the efforts to secure a residential program and Suboxone treatment services.  Pt informed CSW that she would probably go back to [facility] and try to receive services there, but she would like to know how soon she would be able to see the doctor there.  Pt also requested that CSW submit an application to [facility].  CSW informed pt that an application could be submitted, but she would be responsible for contacting the program daily to inquire about open beds.  Pt also asked CSW to check with [facility] in [facility] about their Suboxone program.  CSW agreed to follow through with pt's requests.

## 2018-03-02 NOTE — Progress Notes (Signed)
[facility] MD Progress Note  03/02/2018 12:28 PM Karen Lindsey  MRN:  [id number] Subjective:  Pt has been out of her room more and socializing with peers. She continues to report severe pain and neuropathy. She still feels depressed because of this. She is feeling more hopeful as she learned that there is options to get on Suboxone outside of the hospital. She states taht if her pain is under control then this will allow her to pursue working and counseling to deal with past trauma. She reports passive SI with no plan or intent. She hopes she can get into [place] [facility] [employer]. Denies AH, HI. She states taht she wants to get back on Invokana because it stabilized her blood sugars better.   Principal Problem: Severe recurrent major depression without psychotic features (HCC) Diagnosis:   Patient Active Problem List   Diagnosis Date Noted  . Severe recurrent major depression without psychotic features (HCC) [F33.2] 02/27/2018    Priority: High  . Suicidal ideation [R45.851] 02/27/2018  . Diabetes (HCC) [E11.9] 02/27/2018  . Chronic pain [G89.29] 02/27/2018  . Narcotic abuse (HCC) [F11.10] 01/24/2018  . Acute delirium [R41.0] 01/24/2018  . DKA (diabetic ketoacidoses) (HCC) [E13.10] 01/23/2018  . AKI (acute kidney injury) (HCC) [N17.9] 01/23/2018  . HTN (hypertension) [I10] 01/23/2018  . Chest pain [R07.9] 11/08/2017   Total Time spent with patient: 20 minutes  Past Psychiatric History: See H&P  Past Medical History:  Past Medical History:  Diagnosis Date  . Arthritis    rheumatoid  . Chronic pain    a. upper/mid back.  . Diabetic peripheral neuropathy (HCC)   . Fatty liver   . Hypertension   . Insulin dependent diabetes mellitus (HCC)    a. Dx ~ 2011.  . Narcotic addiction (HCC)    a. Followed in substance abuse center in [place] - on chronic methadone.  . Neuropathy   . RA (rheumatoid arthritis) (HCC)   . Tobacco abuse     Past Surgical History:  Procedure Laterality Date  .  ABDOMINAL HYSTERECTOMY    . CESAREAN SECTION    . CHOLECYSTECTOMY    . KNEE SURGERY    . MANDIBLE FRACTURE SURGERY     Family History: History reviewed. No pertinent family history. Family Psychiatric  History: See H&P Social History:  Social History   Substance and Sexual Activity  Alcohol Use No   Comment: Hasn't had a drink since 1998     Social History   Substance and Sexual Activity  Drug Use Yes   Comment: Previously abused pain pills. prescribed methadone     Social History   Socioeconomic History  . Marital status: Legally Separated    Spouse name: Not on file  . Number of children: Not on file  . Years of education: Not on file  . Highest education level: Not on file  Occupational History    Comment: Works part-time in [employer]  Social Needs  . Financial resource strain: Not on file  . Food insecurity:    Worry: Not on file    Inability: Not on file  . Transportation needs:    Medical: Not on file    Non-medical: Not on file  Tobacco Use  . Smoking status: Current Every Day Smoker    Packs/day: 0.50    Types: Cigarettes  . Smokeless tobacco: Never Used  . Tobacco comment: 30+ years  Substance and Sexual Activity  . Alcohol use: No    Comment:  Hasn't had a drink since 1998  . Drug use: Yes    Comment: Previously abused pain pills. prescribed methadone   . Sexual activity: Not on file  Lifestyle  . Physical activity:    Days per week: Not on file    Minutes per session: Not on file  . Stress: Not on file  Relationships  . Social connections:    Talks on phone: Not on file    Gets together: Not on file    Attends religious service: Not on file    Active member of club or organization: Not on file    Attends meetings of clubs or organizations: Not on file    Relationship status: Not on file  Other Topics Concern  . Not on file  Social History Narrative   Lives locally with friends/roomates.   Additional Social History:                          Sleep: Fair  Appetite:  Fair  Current Medications: Current Facility-Administered Medications  Medication Dose Route Frequency Provider Last Rate Last Dose  . acetaminophen (TYLENOL) tablet 650 mg  650 mg Oral Q6H PRN Clapacs, Jackquline Denmark, MD   650 mg at 03/02/18 5638  . alum & mag hydroxide-simeth (MAALOX/MYLANTA) 200-200-20 MG/5ML suspension 30 mL  30 mL Oral Q4H PRN Clapacs, John T, MD      . amLODipine (NORVASC) tablet 2.5 mg  2.5 mg Oral Daily Clapacs, Jackquline Denmark, MD   2.5 mg at 03/02/18 9373  . [START ON 03/03/2018] calcium-vitamin D (OSCAL WITH D) 500-200 MG-UNIT per tablet 2 tablet  2 tablet Oral Q breakfast Stanlee Roehrig, Ileene Hutchinson, MD      . Melene Muller ON 03/03/2018] canagliflozin [facility]) tablet 100 mg  100 mg Oral QAC breakfast Demarco Bacci R, MD      . cyclobenzaprine (FLEXERIL) tablet 5 mg  5 mg Oral Q8H PRN Aleeya Veitch, Ileene Hutchinson, MD   5 mg at 03/02/18 1135  . DULoxetine (CYMBALTA) DR capsule 60 mg  60 mg Oral Daily Wren Pryce, Ileene Hutchinson, MD   60 mg at 03/02/18 4287  . gabapentin (NEURONTIN) capsule 1,200 mg  1,200 mg Oral TID Clapacs, John T, MD   1,200 mg at 03/02/18 1132  . hydrOXYzine (ATARAX/VISTARIL) tablet 50 mg  50 mg Oral TID PRN Haskell Riling, MD   50 mg at 03/02/18 6811  . insulin aspart (novoLOG) injection 0-15 Units  0-15 Units Subcutaneous TID WC Clapacs, Jackquline Denmark, MD   5 Units at 03/02/18 [phone]  . insulin aspart protamine- aspart (NOVOLOG MIX 70/30) injection 35 Units  35 Units Subcutaneous BID WC Asaf Elmquist R, MD      . ketorolac (TORADOL) 30 MG/ML injection 30 mg  30 mg Intramuscular Q6H PRN Haskell Riling, MD   30 mg at 03/02/18 2035  . lidocaine (LIDODERM) 5 % 1 patch  1 patch Transdermal Q24H Haskell Riling, MD   1 patch at 03/02/18 1132  . lisinopril (PRINIVIL,ZESTRIL) tablet 20 mg  20 mg Oral Daily Clapacs, Jackquline Denmark, MD   20 mg at 03/02/18 5974  . loperamide (IMODIUM) capsule 2 mg  2 mg Oral PRN Clapacs, Jackquline Denmark, MD   2 mg at 02/28/18 1837  . magnesium hydroxide (MILK OF  MAGNESIA) suspension 30 mL  30 mL Oral Daily PRN Clapacs, Jackquline Denmark, MD      . Melene Muller ON 03/03/2018] multivitamin-lutein (OCUVITE-LUTEIN) capsule 1 capsule  1  capsule Oral Daily Teresia Myint, Ileene Hutchinson, MD      . traZODone (DESYREL) tablet 150 mg  150 mg Oral QHS PRN Haskell Riling, MD   150 mg at 03/01/18 2147    Lab Results:  Results for orders placed or performed during the hospital encounter of 02/27/18 (from the past 48 hour(s))  Glucose, capillary     Status: Abnormal   Collection Time: 02/28/18  4:31 PM  Result Value Ref Range   Glucose-Capillary 289 (H) 70 - 99 mg/dL  Glucose, capillary     Status: Abnormal   Collection Time: 02/28/18  8:29 PM  Result Value Ref Range   Glucose-Capillary 123 (H) 70 - 99 mg/dL  Glucose, capillary     Status: Abnormal   Collection Time: 03/01/18  7:01 AM  Result Value Ref Range   Glucose-Capillary 235 (H) 70 - 99 mg/dL   Comment 1 Notify RN   Glucose, capillary     Status: Abnormal   Collection Time: 03/01/18 11:32 AM  Result Value Ref Range   Glucose-Capillary 259 (H) 70 - 99 mg/dL   Comment 1 Notify RN   Glucose, capillary     Status: Abnormal   Collection Time: 03/01/18  4:34 PM  Result Value Ref Range   Glucose-Capillary 134 (H) 70 - 99 mg/dL   Comment 1 Notify RN   Glucose, capillary     Status: Abnormal   Collection Time: 03/01/18  6:49 PM  Result Value Ref Range   Glucose-Capillary 232 (H) 70 - 99 mg/dL  Glucose, capillary     Status: Abnormal   Collection Time: 03/01/18  8:13 PM  Result Value Ref Range   Glucose-Capillary 255 (H) 70 - 99 mg/dL  Glucose, capillary     Status: Abnormal   Collection Time: 03/02/18  7:17 AM  Result Value Ref Range   Glucose-Capillary 202 (H) 70 - 99 mg/dL   Comment 1 Notify RN   Glucose, capillary     Status: Abnormal   Collection Time: 03/02/18 11:30 AM  Result Value Ref Range   Glucose-Capillary 200 (H) 70 - 99 mg/dL    Blood Alcohol level:  Lab Results  Component Value Date   ETH <10 02/26/2018    ETH <10 02/16/2018    Metabolic Disorder Labs: Lab Results  Component Value Date   HGBA1C 10.2 (H) 02/28/2018   MPG 246.04 02/28/2018   MPG 280.48 11/08/2017   No results found for: PROLACTIN Lab Results  Component Value Date   CHOL 223 (H) 02/28/2018   TRIG 173 (H) 02/28/2018   HDL 41 02/28/2018   CHOLHDL 5.4 02/28/2018   VLDL 35 02/28/2018   LDLCALC 147 (H) 02/28/2018    Physical Findings: AIMS:  , ,  ,  ,    CIWA:    COWS:     Musculoskeletal: Strength & Muscle Tone: within normal limits Gait & Station: normal Patient leans: N/A  Psychiatric Specialty Exam: Physical Exam  Nursing note and vitals reviewed.   Review of Systems  All other systems reviewed and are negative.   Blood pressure 121/68, pulse 86, temperature 98.6 F (37 C), temperature source Oral, resp. rate 18, height 5\' 1"  (1.549 m), weight 56.7 kg, SpO2 98 %.Body mass index is 23.62 kg/m.  General Appearance: Casual  Eye Contact:  Good  Speech:  Clear and Coherent  Volume:  Normal  Mood:  Depressed  Affect:  Congruent  Thought Process:  Coherent and Goal Directed  Orientation:  Full (Time,  Place, and Person)  Thought Content:  Logical  Suicidal Thoughts:  Yes.  without intent/plan  Homicidal Thoughts:  No  Memory:  Immediate;   Fair  Judgement:  Fair  Insight:  Fair  Psychomotor Activity:  Normal  Concentration:  Concentration: Fair  Recall:  [employer] of Knowledge:  Fair  Language:  Fair  Akathisia:  No      Assets:  Resilience  ADL's:  Intact  Cognition:  WNL  Sleep:  Number of Hours: 7     Treatment Plan Summary: 52 yo female admitted due to SI. She is feeling more hopeful that she can get on Suboxone. She is hoping to get into [facility] which would help her mood a lot. She endorses passive SI.   Plan:  MDD -Continue Cymbalta 60 mg daily  Chronic Pain -Gabapentin 1200 mg TID -prn IM Toradol. She is only able to be on IM for 5 days. She will then  need to be switched to oral -Add prn Flexeril -continue Lidocaine patch  HTN Stable -Continue Norvasc and Lisinopril \ DM -Spoke with Diabetes coordinator -Will restart home INvokana 100 mg daily -Decrease 70/30 to 35 units BID  Dispo -CSW looking into shelter options.   Haskell Riling, MD 03/02/2018, 12:28 PM

## 2018-03-02 NOTE — [facility] Group Notes (Signed)
03/02/2018 1PM  Type of Therapy and Topic:  Group Therapy:  Feelings around Relapse and Recovery  Participation Level:  Active   Description of Group:    Patients in this group will discuss emotions they experience before and after a relapse. They will process how experiencing these feelings, or avoidance of experiencing them, relates to having a relapse. Facilitator will guide patients to explore emotions they have related to recovery. Patients will be encouraged to process which emotions are more powerful. They will be guided to discuss the emotional reaction significant others in their lives may have to patients' relapse or recovery. Patients will be assisted in exploring ways to respond to the emotions of others without this contributing to a relapse.  Therapeutic Goals: 1. Patient will identify two or more emotions that lead to a relapse for them 2. Patient will identify two emotions that result when they relapse 3. Patient will identify two emotions related to recovery 4. Patient will demonstrate ability to communicate their needs through discussion and/or role plays   Summary of Patient Progress: Patient attended free expressions group which included recreational play and music therapy. Patient interacted appropriately with other group members. Patient is still in the process of meeting their treatment goals.     Therapeutic Modalities:   Cognitive Behavioral Therapy Solution-Focused Therapy Assertiveness Training Relapse Prevention Therapy   Johny Shears, [place] 03/02/2018 2:33 PM

## 2018-03-02 NOTE — Plan of Care (Signed)
Improved mood and affect, happy, descent appearance upkeep, peer to peer interactions appropriate, denied SI/HI/AVH; received medications as needed for sleep-Trazodone 150 mg, anxiety-Vistaril 50 mg and Toradol 30 mg/ml IM for 10/10 neuropathic, Rheumatoid arthiritis and generalized pain.  Patient slept for Estimated Hours of 7; Precautionary checks every 15 minutes for safety maintained, room free of safety hazards, patient sustains no injury or falls during this shift.  Problem: Coping: Goal: Coping ability will improve Outcome: Progressing   Problem: Medication: Goal: Compliance with prescribed medication regimen will improve Outcome: Progressing   Problem: Education: Goal: Emotional status will improve Outcome: Progressing   Problem: Safety: Goal: Ability to disclose and discuss suicidal ideas will improve Outcome: Progressing   Problem: Safety: Goal: Ability to remain free from injury will improve Outcome: Progressing

## 2018-03-02 NOTE — Plan of Care (Signed)
Patient has the ability to cope and has been observed attending and participating in unit groups without any issues . Patient verbalizes understanding of and has been in compliance with her prescribed medication regimen and all questions/concerns have been addressed and answered at this time. Patient verbalizes understanding of the general information that's been provided to her and has not voiced any further questions at this time. Patient denies SI/HI/AVH, however, she does endorse depression and anxiety, rating his depression a "8/10" and his anxiety a "10/10" stating that her pain is making her depressed and anxious. Patient has been free from injury thus far and remains safe on the unit at this time.  Problem: Coping: Goal: Coping ability will improve Outcome: Progressing   Problem: Medication: Goal: Compliance with prescribed medication regimen will improve Outcome: Progressing   Problem: Education: Goal: Knowledge of [facility] Education information/materials will improve Outcome: Progressing Goal: Emotional status will improve Outcome: Progressing Goal: Mental status will improve Outcome: Progressing Goal: Verbalization of understanding the information provided will improve Outcome: Progressing   Problem: Safety: Goal: Ability to disclose and discuss suicidal ideas will improve Outcome: Progressing Goal: Ability to identify and utilize support systems that promote safety will improve Outcome: Progressing   Problem: Safety: Goal: Ability to remain free from injury will improve Outcome: Progressing

## 2018-03-02 NOTE — [facility] Suicide Risk Assessment (Signed)
[facility] INPATIENT:  Family/Significant Other Suicide Prevention Education  Suicide Prevention Education:  Education Completed; Bettina Gavia (friend, [phone]) has been identified by the patient as the significant other who will aid the patient in the event of a mental health crisis (suicidal ideations/suicide attempt).  With written consent from the patient, the significant other has been provided the following suicide prevention education, prior to the and/or following the discharge of the patient.  The suicide prevention education provided includes the following:  Suicide risk factors  Suicide prevention and interventions  National Suicide Hotline telephone number  [facility] assessment telephone number  [facility] Emergency Assistance 911  [facility] and/or Residential Mobile Crisis Unit telephone number  Request made of family/significant other to:  Remove weapons (e.g., guns, rifles, knives), all items previously/currently identified as safety concern.    Remove drugs/medications (over-the-counter, prescriptions, illicit drugs), all items previously/currently identified as a safety concern.  The family member/significant other verbalizes understanding of the suicide prevention education information provided.  Alease Frame, LCSW 03/02/2018, 1:41 PM

## 2018-03-02 NOTE — Progress Notes (Addendum)
[facility] MD Progress Note  03/03/2018 10:33 AM LAQUASIA LYDICK  MRN:  [id number]  Subjective:   Ms. Gubler is in bed this morning complaining of poor sleep due to increased pain. She denies depression or suicidal ideation but is worried about getting into Suboxone clinic treatment and disposition. She was accepted to [facility] but a bed will not open until next month. "I will have to figure something out."  Principal Problem: Severe recurrent major depression without psychotic features (HCC) Diagnosis:   Patient Active Problem List   Diagnosis Date Noted  . Severe recurrent major depression without psychotic features (HCC) [F33.2] 02/27/2018    Priority: High  . Tobacco use disorder [F17.200] 03/02/2018  . Suicidal ideation [R45.851] 02/27/2018  . Diabetes (HCC) [E11.9] 02/27/2018  . Chronic pain [G89.29] 02/27/2018  . Narcotic abuse (HCC) [F11.10] 01/24/2018  . Acute delirium [R41.0] 01/24/2018  . DKA (diabetic ketoacidoses) (HCC) [E13.10] 01/23/2018  . AKI (acute kidney injury) (HCC) [N17.9] 01/23/2018  . HTN (hypertension) [I10] 01/23/2018  . Chest pain [R07.9] 11/08/2017   Total Time spent with patient: 20 minutes  Past Psychiatric History: depression, chronic pain  Past Medical History:  Past Medical History:  Diagnosis Date  . Arthritis    rheumatoid  . Chronic pain    a. upper/mid back.  . Diabetic peripheral neuropathy (HCC)   . Fatty liver   . Hypertension   . Insulin dependent diabetes mellitus (HCC)    a. Dx ~ 2011.  . Narcotic addiction (HCC)    a. Followed in substance abuse center in [place] - on chronic methadone.  . Neuropathy   . RA (rheumatoid arthritis) (HCC)   . Tobacco abuse     Past Surgical History:  Procedure Laterality Date  . ABDOMINAL HYSTERECTOMY    . CESAREAN SECTION    . CHOLECYSTECTOMY    . KNEE SURGERY    . MANDIBLE FRACTURE SURGERY     Family History: History reviewed. No pertinent family history. Family Psychiatric  History: alcoholic  mother Social History:  Social History   Substance and Sexual Activity  Alcohol Use No   Comment: Hasn't had a drink since 1998     Social History   Substance and Sexual Activity  Drug Use Yes   Comment: Previously abused pain pills. prescribed methadone     Social History   Socioeconomic History  . Marital status: Legally Separated    Spouse name: Not on file  . Number of children: Not on file  . Years of education: Not on file  . Highest education level: Not on file  Occupational History    Comment: Works part-time in [employer]iological [employer]  Social Needs  . Financial resource strain: Not on file  . Food insecurity:    Worry: Not on file    Inability: Not on file  . Transportation needs:    Medical: Not on file    Non-medical: Not on file  Tobacco Use  . Smoking status: Current Every Day Smoker    Packs/day: 0.50    Types: Cigarettes  . Smokeless tobacco: Never Used  . Tobacco comment: 30+ years  Substance and Sexual Activity  . Alcohol use: No    Comment: Hasn't had a drink since 1998  . Drug use: Yes    Comment: Previously abused pain pills. prescribed methadone   . Sexual activity: Not on file  Lifestyle  . Physical activity:    Days per week: Not on file    Minutes  per session: Not on file  . Stress: Not on file  Relationships  . Social connections:    Talks on phone: Not on file    Gets together: Not on file    Attends religious service: Not on file    Active member of club or organization: Not on file    Attends meetings of clubs or organizations: Not on file    Relationship status: Not on file  Other Topics Concern  . Not on file  Social History Narrative   Lives locally with friends/roomates.   Additional Social History:                         Sleep: Poor  Appetite:  Fair  Current Medications: Current Facility-Administered Medications  Medication Dose Route Frequency Provider Last Rate Last Dose  . acetaminophen (TYLENOL) tablet  650 mg  650 mg Oral Q6H PRN Clapacs, Jackquline Denmark, MD   650 mg at 03/03/18 0846  . alum & mag hydroxide-simeth (MAALOX/MYLANTA) 200-200-20 MG/5ML suspension 30 mL  30 mL Oral Q4H PRN Clapacs, John T, MD      . amLODipine (NORVASC) tablet 2.5 mg  2.5 mg Oral Daily Clapacs, Jackquline Denmark, MD   2.5 mg at 03/03/18 4431  . calcium-vitamin D (OSCAL WITH D) 500-200 MG-UNIT per tablet 2 tablet  2 tablet Oral Q breakfast McNew, Ileene Hutchinson, MD   2 tablet at 03/03/18 [phone]  . cyclobenzaprine (FLEXERIL) tablet 5 mg  5 mg Oral Q8H PRN Haskell Riling, MD   5 mg at 03/03/18 0847  . DULoxetine (CYMBALTA) DR capsule 60 mg  60 mg Oral Daily McNew, Ileene Hutchinson, MD   60 mg at 03/03/18 [phone]  . gabapentin (NEURONTIN) capsule 1,200 mg  1,200 mg Oral TID Clapacs, John T, MD   1,200 mg at 03/03/18 0837  . hydrOXYzine (ATARAX/VISTARIL) tablet 50 mg  50 mg Oral TID PRN Haskell Riling, MD   50 mg at 03/02/18 2124  . insulin aspart (novoLOG) injection 0-15 Units  0-15 Units Subcutaneous TID WC Clapacs, Jackquline Denmark, MD   2 Units at 03/03/18 [phone]  . insulin aspart protamine- aspart (NOVOLOG MIX 70/30) injection 35 Units  35 Units Subcutaneous BID WC Haskell Riling, MD   35 Units at 03/03/18 [phone]  . ketorolac (TORADOL) 30 MG/ML injection 30 mg  30 mg Intramuscular Q6H PRN Haskell Riling, MD   30 mg at 03/03/18 0835  . [START ON 03/05/2018] ketorolac (TORADOL) tablet 10 mg  10 mg Oral Q6H PRN McNew, Holly R, MD      . lidocaine (LIDODERM) 5 % 1 patch  1 patch Transdermal Q24H McNew, Ileene Hutchinson, MD   1 patch at 03/02/18 1132  . lisinopril (PRINIVIL,ZESTRIL) tablet 20 mg  20 mg Oral Daily Clapacs, Jackquline Denmark, MD   20 mg at 03/03/18 0837  . loperamide (IMODIUM) capsule 2 mg  2 mg Oral PRN Clapacs, Jackquline Denmark, MD   2 mg at 02/28/18 1837  . magnesium hydroxide (MILK OF MAGNESIA) suspension 30 mL  30 mL Oral Daily PRN Clapacs, Jackquline Denmark, MD      . multivitamin-lutein (OCUVITE-LUTEIN) capsule 1 capsule  1 capsule Oral Daily McNew, Ileene Hutchinson, MD   1 capsule at 03/03/18 0837  .  traZODone (DESYREL) tablet 300 mg  300 mg Oral QHS PRN Levin Dagostino B, MD        Lab Results:  Results for orders placed or performed during  the hospital encounter of 02/27/18 (from the past 48 hour(s))  Glucose, capillary     Status: Abnormal   Collection Time: 03/01/18 11:32 AM  Result Value Ref Range   Glucose-Capillary 259 (H) 70 - 99 mg/dL   Comment 1 Notify RN   Glucose, capillary     Status: Abnormal   Collection Time: 03/01/18  4:34 PM  Result Value Ref Range   Glucose-Capillary 134 (H) 70 - 99 mg/dL   Comment 1 Notify RN   Glucose, capillary     Status: Abnormal   Collection Time: 03/01/18  6:49 PM  Result Value Ref Range   Glucose-Capillary 232 (H) 70 - 99 mg/dL  Glucose, capillary     Status: Abnormal   Collection Time: 03/01/18  8:13 PM  Result Value Ref Range   Glucose-Capillary 255 (H) 70 - 99 mg/dL  Glucose, capillary     Status: Abnormal   Collection Time: 03/02/18  7:17 AM  Result Value Ref Range   Glucose-Capillary 202 (H) 70 - 99 mg/dL   Comment 1 Notify RN   Glucose, capillary     Status: Abnormal   Collection Time: 03/02/18 11:30 AM  Result Value Ref Range   Glucose-Capillary 200 (H) 70 - 99 mg/dL  Glucose, capillary     Status: Abnormal   Collection Time: 03/02/18  4:15 PM  Result Value Ref Range   Glucose-Capillary 181 (H) 70 - 99 mg/dL  Glucose, capillary     Status: Abnormal   Collection Time: 03/02/18  9:17 PM  Result Value Ref Range   Glucose-Capillary 135 (H) 70 - 99 mg/dL  Glucose, capillary     Status: Abnormal   Collection Time: 03/03/18  7:15 AM  Result Value Ref Range   Glucose-Capillary 136 (H) 70 - 99 mg/dL    Blood Alcohol level:  Lab Results  Component Value Date   ETH <10 02/26/2018   ETH <10 02/16/2018    Metabolic Disorder Labs: Lab Results  Component Value Date   HGBA1C 10.2 (H) 02/28/2018   MPG 246.04 02/28/2018   MPG 280.48 11/08/2017   No results found for: PROLACTIN Lab Results  Component Value Date    CHOL 223 (H) 02/28/2018   TRIG 173 (H) 02/28/2018   HDL 41 02/28/2018   CHOLHDL 5.4 02/28/2018   VLDL 35 02/28/2018   LDLCALC 147 (H) 02/28/2018    Physical Findings: AIMS:  , ,  ,  ,    CIWA:    COWS:     Musculoskeletal: Strength & Muscle Tone: within normal limits Gait & Station: normal Patient leans: N/A  Psychiatric Specialty Exam: Physical Exam  Nursing note and vitals reviewed. Psychiatric: She has a normal mood and affect. Her speech is normal. Thought content normal. She is slowed and withdrawn. Cognition and memory are normal. She expresses impulsivity.    Review of Systems  Musculoskeletal: Positive for back pain.  Neurological: Negative.   Psychiatric/Behavioral: The patient has insomnia.   All other systems reviewed and are negative.   Blood pressure 122/79, pulse 88, temperature 98.5 F (36.9 C), temperature source Oral, resp. rate 18, height 5\' 1"  (1.549 m), weight 56.7 kg, SpO2 99 %.Body mass index is 23.62 kg/m.  General Appearance: Fairly Groomed  Eye Contact:  Good  Speech:  Clear and Coherent  Volume:  Normal  Mood:  Euthymic  Affect:  Flat  Thought Process:  Goal Directed and Descriptions of Associations: Intact  Orientation:  Full (Time, Place, and Person)  Thought  Content:  WDL  Suicidal Thoughts:  No  Homicidal Thoughts:  No  Memory:  Immediate;   Fair Recent;   Fair Remote;   Fair  Judgement:  Poor  Insight:  Shallow  Psychomotor Activity:  Psychomotor Retardation  Concentration:  Concentration: Fair and Attention Span: Fair  Recall:  [employer] of Knowledge:  Fair  Language:  Fair  Akathisia:  No  Handed:  Right  AIMS (if indicated):     Assets:  Communication Skills Desire for Improvement Financial Resources/Insurance Resilience Social Support  ADL's:  Intact  Cognition:  WNL  Sleep:  Number of Hours: 6.45     Treatment Plan Summary: Daily contact with patient to assess and evaluate symptoms and progress in treatment and  Medication management   Ms. Threat is a 52 yo female admitted due to SI. She is feeling more hopeful that she can get on Suboxone. She is hoping to get into [facility] or [facility] which would help her mood a lot. She no longer endorses SI.   Plan:  MDD -Continue Cymbalta 60 mg daily  #Insomina Increase Trazodone to 300 mg nightly  Chronic Pain -Gabapentin 1200 mg TID -prn IM Toradol. She is only able to be on IM for 5 days. She will then need to be switched to oral -Add prn Flexeril -continue Lidocaine patch  HTN Stable -Continue Norvasc and Lisinopril  #DM, sugars elevated -Invokana was discontinued per hospital policy -paged Diabetes coordinator for correct insulin dose -Insulin 70/30 BID  Dispo -CSW looking into shelter options.   Kristine Linea, MD 03/03/2018, 10:33 AM

## 2018-03-03 LAB — GLUCOSE, CAPILLARY
GLUCOSE-CAPILLARY: 136 mg/dL — AB (ref 70–99)
GLUCOSE-CAPILLARY: 196 mg/dL — AB (ref 70–99)
GLUCOSE-CAPILLARY: 266 mg/dL — AB (ref 70–99)
Glucose-Capillary: 232 mg/dL — ABNORMAL HIGH (ref 70–99)

## 2018-03-03 MED ORDER — TRAZODONE HCL 100 MG PO TABS
300.0000 mg | ORAL_TABLET | Freq: Every evening | ORAL | Status: DC | PRN
  Administered 2018-03-03 – 2018-03-05 (×3): 300 mg via ORAL
  Filled 2018-03-03 (×3): qty 3

## 2018-03-03 NOTE — Plan of Care (Signed)
Patient is coping well with treatment plan and pain relieve therapy explained and patient understood information provided , encourage patient to verbalize positive feeling about self, patient is attending groups and compliant with her medicines, no distress noted 15 minute safety rounding is maintained , patient denies SI/HI and AVH.   Problem: Coping: Goal: Coping ability will improve Outcome: Progressing   Problem: Medication: Goal: Compliance with prescribed medication regimen will improve Outcome: Progressing   Problem: Education: Goal: Knowledge of [facility] Education information/materials will improve Outcome: Progressing Goal: Emotional status will improve Outcome: Progressing Goal: Mental status will improve Outcome: Progressing Goal: Verbalization of understanding the information provided will improve Outcome: Progressing   Problem: Safety: Goal: Ability to disclose and discuss suicidal ideas will improve Outcome: Progressing Goal: Ability to identify and utilize support systems that promote safety will improve Outcome: Progressing   Problem: Safety: Goal: Ability to remain free from injury will improve Outcome: Progressing
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
## 2018-03-03 NOTE — Progress Notes (Signed)
Received Karen Lindsey this AM after breakfast, she was compliant with her medications. She continues to endorse anxiety and depression related to her pain, Suboxone treatment and living condition. She is OOB in the milieu at intervals with her peers. No change in her status this PM.

## 2018-03-03 NOTE — Progress Notes (Signed)
Patient's order for Karen Lindsey has been discontinued per hospital policy. Please continue sliding scale and basal insulin for DM.   Luisa Hart, PharmD Clinical Pharmacist

## 2018-03-04 LAB — GLUCOSE, CAPILLARY
GLUCOSE-CAPILLARY: 217 mg/dL — AB (ref 70–99)
GLUCOSE-CAPILLARY: 231 mg/dL — AB (ref 70–99)
Glucose-Capillary: 188 mg/dL — ABNORMAL HIGH (ref 70–99)
Glucose-Capillary: 243 mg/dL — ABNORMAL HIGH (ref 70–99)

## 2018-03-04 NOTE — [facility] Group Notes (Signed)
[facility] Group Notes:  (Nursing/MHT/Case Management/Adjunct)  Date:  03/04/2018  Time:  9:44 PM  Type of Therapy:  Group Therapy  Participation Level:  Active  Participation Quality:  Appropriate  Affect:  Appropriate  Cognitive:  Alert  Insight:  Good  Engagement in Group:  Engaged  Modes of Intervention:  Discussion  Summary of Progress/Problems:  Karen Lindsey 03/04/2018, 9:44 PM

## 2018-03-04 NOTE — Progress Notes (Addendum)
[facility] MD Progress Note  03/04/2018 11:22 AM Karen Lindsey  MRN:  [id number]  Subjective:    Karen Lindsey is in better spirits but still focused on her pain. Slept better with higher dose of Trazodone but still woke up multiple times at night due to pain. Hoping to get to [facility] and free Suboxone clinic. Denies suicidal or homicidal ideation. Tolerates medications well.  Principal Problem: Severe recurrent major depression without psychotic features (HCC) Diagnosis:   Patient Active Problem List   Diagnosis Date Noted  . Severe recurrent major depression without psychotic features (HCC) [F33.2] 02/27/2018    Priority: High  . Tobacco use disorder [F17.200] 03/02/2018  . Suicidal ideation [R45.851] 02/27/2018  . Diabetes (HCC) [E11.9] 02/27/2018  . Chronic pain [G89.29] 02/27/2018  . Narcotic abuse (HCC) [F11.10] 01/24/2018  . Acute delirium [R41.0] 01/24/2018  . DKA (diabetic ketoacidoses) (HCC) [E13.10] 01/23/2018  . AKI (acute kidney injury) (HCC) [N17.9] 01/23/2018  . HTN (hypertension) [I10] 01/23/2018  . Chest pain [R07.9] 11/08/2017   Total Time spent with patient: 20 minutes  Past Psychiatric History: depression  Past Medical History:  Past Medical History:  Diagnosis Date  . Arthritis    rheumatoid  . Chronic pain    a. upper/mid back.  . Diabetic peripheral neuropathy (HCC)   . Fatty liver   . Hypertension   . Insulin dependent diabetes mellitus (HCC)    a. Dx ~ 2011.  . Narcotic addiction (HCC)    a. Followed in substance abuse center in [place] - on chronic methadone.  . Neuropathy   . RA (rheumatoid arthritis) (HCC)   . Tobacco abuse     Past Surgical History:  Procedure Laterality Date  . ABDOMINAL HYSTERECTOMY    . CESAREAN SECTION    . CHOLECYSTECTOMY    . KNEE SURGERY    . MANDIBLE FRACTURE SURGERY     Family History: History reviewed. No pertinent family history. Family Psychiatric  History: alcoholic mother Social History:  Social History    Substance and Sexual Activity  Alcohol Use No   Comment: Hasn't had a drink since 1998     Social History   Substance and Sexual Activity  Drug Use Yes   Comment: Previously abused pain pills. prescribed methadone     Social History   Socioeconomic History  . Marital status: Legally Separated    Spouse name: Not on file  . Number of children: Not on file  . Years of education: Not on file  . Highest education level: Not on file  Occupational History    Comment: Works part-time in Physiological scientist  Social Needs  . Financial resource strain: Not on file  . Food insecurity:    Worry: Not on file    Inability: Not on file  . Transportation needs:    Medical: Not on file    Non-medical: Not on file  Tobacco Use  . Smoking status: Current Every Day Smoker    Packs/day: 0.50    Types: Cigarettes  . Smokeless tobacco: Never Used  . Tobacco comment: 30+ years  Substance and Sexual Activity  . Alcohol use: No    Comment: Hasn't had a drink since 1998  . Drug use: Yes    Comment: Previously abused pain pills. prescribed methadone   . Sexual activity: Not on file  Lifestyle  . Physical activity:    Days per week: Not on file    Minutes per session: Not on file  .  Stress: Not on file  Relationships  . Social connections:    Talks on phone: Not on file    Gets together: Not on file    Attends religious service: Not on file    Active member of club or organization: Not on file    Attends meetings of clubs or organizations: Not on file    Relationship status: Not on file  Other Topics Concern  . Not on file  Social History Narrative   Lives locally with friends/roomates.   Additional Social History:                         Sleep: Fair  Appetite:  Fair  Current Medications: Current Facility-Administered Medications  Medication Dose Route Frequency Provider Last Rate Last Dose  . acetaminophen (TYLENOL) tablet 650 mg  650 mg Oral Q6H PRN Clapacs, Jackquline Denmark, MD   650 mg at 03/04/18 0905  . alum & mag hydroxide-simeth (MAALOX/MYLANTA) 200-200-20 MG/5ML suspension 30 mL  30 mL Oral Q4H PRN Clapacs, John T, MD      . amLODipine (NORVASC) tablet 2.5 mg  2.5 mg Oral Daily Clapacs, John T, MD   2.5 mg at 03/04/18 0906  . calcium-vitamin D (OSCAL WITH D) 500-200 MG-UNIT per tablet 2 tablet  2 tablet Oral Q breakfast McNew, Ileene Hutchinson, MD   2 tablet at 03/04/18 [phone]  . cyclobenzaprine (FLEXERIL) tablet 5 mg  5 mg Oral Q8H PRN Haskell Riling, MD   5 mg at 03/04/18 0910  . DULoxetine (CYMBALTA) DR capsule 60 mg  60 mg Oral Daily McNew, Ileene Hutchinson, MD   60 mg at 03/04/18 0908  . gabapentin (NEURONTIN) capsule 1,200 mg  1,200 mg Oral TID Clapacs, John T, MD   1,200 mg at 03/04/18 0907  . hydrOXYzine (ATARAX/VISTARIL) tablet 50 mg  50 mg Oral TID PRN Haskell Riling, MD   50 mg at 03/03/18 1703  . insulin aspart (novoLOG) injection 0-15 Units  0-15 Units Subcutaneous TID WC Clapacs, Jackquline Denmark, MD   3 Units at 03/04/18 0911  . insulin aspart protamine- aspart (NOVOLOG MIX 70/30) injection 35 Units  35 Units Subcutaneous BID WC McNew, Ileene Hutchinson, MD   35 Units at 03/04/18 0910  . ketorolac (TORADOL) 30 MG/ML injection 30 mg  30 mg Intramuscular Q6H PRN Haskell Riling, MD   30 mg at 03/04/18 0910  . [START ON 03/05/2018] ketorolac (TORADOL) tablet 10 mg  10 mg Oral Q6H PRN Haskell Riling, MD   10 mg at 03/03/18 2302  . lidocaine (LIDODERM) 5 % 1 patch  1 patch Transdermal Q24H Haskell Riling, MD   1 patch at 03/04/18 0911  . lisinopril (PRINIVIL,ZESTRIL) tablet 20 mg  20 mg Oral Daily Clapacs, Jackquline Denmark, MD   20 mg at 03/04/18 0909  . loperamide (IMODIUM) capsule 2 mg  2 mg Oral PRN Clapacs, Jackquline Denmark, MD   2 mg at 02/28/18 1837  . magnesium hydroxide (MILK OF MAGNESIA) suspension 30 mL  30 mL Oral Daily PRN Clapacs, Jackquline Denmark, MD      . multivitamin-lutein (OCUVITE-LUTEIN) capsule 1 capsule  1 capsule Oral Daily McNew, Ileene Hutchinson, MD   1 capsule at 03/04/18 0909  . traZODone (DESYREL)  tablet 300 mg  300 mg Oral QHS PRN Debbie Yearick B, MD   300 mg at 03/03/18 2156    Lab Results:  Results for orders placed or performed during the  hospital encounter of 02/27/18 (from the past 48 hour(s))  Glucose, capillary     Status: Abnormal   Collection Time: 03/02/18 11:30 AM  Result Value Ref Range   Glucose-Capillary 200 (H) 70 - 99 mg/dL  Glucose, capillary     Status: Abnormal   Collection Time: 03/02/18  4:15 PM  Result Value Ref Range   Glucose-Capillary 181 (H) 70 - 99 mg/dL  Glucose, capillary     Status: Abnormal   Collection Time: 03/02/18  9:17 PM  Result Value Ref Range   Glucose-Capillary 135 (H) 70 - 99 mg/dL  Glucose, capillary     Status: Abnormal   Collection Time: 03/03/18  7:15 AM  Result Value Ref Range   Glucose-Capillary 136 (H) 70 - 99 mg/dL  Glucose, capillary     Status: Abnormal   Collection Time: 03/03/18 11:23 AM  Result Value Ref Range   Glucose-Capillary 196 (H) 70 - 99 mg/dL  Glucose, capillary     Status: Abnormal   Collection Time: 03/03/18  4:25 PM  Result Value Ref Range   Glucose-Capillary 266 (H) 70 - 99 mg/dL  Glucose, capillary     Status: Abnormal   Collection Time: 03/03/18  8:59 PM  Result Value Ref Range   Glucose-Capillary 232 (H) 70 - 99 mg/dL   Comment 1 Notify RN   Glucose, capillary     Status: Abnormal   Collection Time: 03/04/18  7:19 AM  Result Value Ref Range   Glucose-Capillary 188 (H) 70 - 99 mg/dL  Glucose, capillary     Status: Abnormal   Collection Time: 03/04/18 11:17 AM  Result Value Ref Range   Glucose-Capillary 217 (H) 70 - 99 mg/dL    Blood Alcohol level:  Lab Results  Component Value Date   ETH <10 02/26/2018   ETH <10 02/16/2018    Metabolic Disorder Labs: Lab Results  Component Value Date   HGBA1C 10.2 (H) 02/28/2018   MPG 246.04 02/28/2018   MPG 280.48 11/08/2017   No results found for: PROLACTIN Lab Results  Component Value Date   CHOL 223 (H) 02/28/2018   TRIG 173 (H)  02/28/2018   HDL 41 02/28/2018   CHOLHDL 5.4 02/28/2018   VLDL 35 02/28/2018   LDLCALC 147 (H) 02/28/2018    Physical Findings: AIMS:  , ,  ,  ,    CIWA:    COWS:     Musculoskeletal: Strength & Muscle Tone: within normal limits Gait & Station: normal Patient leans: N/A  Psychiatric Specialty Exam: Physical Exam  Nursing note and vitals reviewed. Psychiatric: Her speech is normal and behavior is normal. Thought content normal. Cognition and memory are normal. She expresses impulsivity. She exhibits a depressed mood.    Review of Systems  Neurological: Negative.   Psychiatric/Behavioral: Positive for depression.  All other systems reviewed and are negative.   Blood pressure 131/80, pulse 97, temperature 98.1 F (36.7 C), temperature source Oral, resp. rate 16, height 5\' 1"  (1.549 m), weight 56.7 kg, SpO2 99 %.Body mass index is 23.62 kg/m.  General Appearance: Casual  Eye Contact:  Good  Speech:  Clear and Coherent  Volume:  Normal  Mood:  Depressed  Affect:  Appropriate  Thought Process:  Goal Directed and Descriptions of Associations: Intact  Orientation:  Full (Time, Place, and Person)  Thought Content:  WDL  Suicidal Thoughts:  No  Homicidal Thoughts:  No  Memory:  Immediate;   Fair Recent;   Fair Remote;   Fair  Judgement:  Impaired  Insight:  Shallow  Psychomotor Activity:  Normal  Concentration:  Concentration: Fair and Attention Span: Fair  Recall:  [employer] of Knowledge:  Fair  Language:  Fair  Akathisia:  No  Handed:  Right  AIMS (if indicated):     Assets:  Communication Skills Desire for Improvement Financial Resources/Insurance Physical Health  ADL's:  Intact  Cognition:  WNL  Sleep:  Number of Hours: 6.5     Treatment Plan Summary: Daily contact with patient to assess and evaluate symptoms and progress in treatment and Medication management   Ms. Selman is a 52 yo female admitted due to SI. She is feeling more hopeful that she can get  on Suboxone. She is hoping to get into [facility] or [facility] which would help her mood a lot. She no longer endorses SI.   Plan:  MDD -Continue Cymbalta 60 mg daily  #Insomina -continue Trazodone 300 mg nightly  Chronic Pain -Gabapentin 1200 mg TID -prn IM Toradol. She is only able to be on IM for 5 days. She will then need to be switched to oral -Add prn Flexeril -continue Lidocaine patch  HTN Stable -Continue Norvasc and Lisinopril  #DM, sugars elevated -Invokana was discontinued per hospital policy -paged Diabetes coordinator for correct insulin dose -Insulin 70/30 35 units BID  Dispo -CSW looking into shelter options.  Kristine Linea, MD 03/04/2018, 11:22 AM

## 2018-03-04 NOTE — Plan of Care (Signed)
Active in the milieu, attended group, took medications

## 2018-03-04 NOTE — Plan of Care (Signed)
Compliant with treatment 

## 2018-03-04 NOTE — Progress Notes (Signed)
Received Karen Lindsey this AM, her morning medications were taken to the room. She c/o generalized pain and was medicated per order. Later she got up and joined her peers in the courtyard. She continues to feel a little anxious and depressed related to her pain, Suboxone noncompliance and being hospitalized. She played cards with her peers after dinner.

## 2018-03-04 NOTE — Progress Notes (Signed)
Patient was visible in the milieu until bed time. Attended evening activities and interacted with staff and peers appropriately. Patient complained of pain and requested medications "nothing helps". Went to bed and stayed awake for a while. Currently sleeping and appears to be comfortable in bed. Staff continue to monitor.

## 2018-03-04 NOTE — [facility] Counselor (Signed)
[facility] LCSW Group Therapy  03/04/2018 2 pm  Type of Therapy:  Group Therapy (Open discussion) Feeling about returning home  Participation Level:  Active  Participation Quality:  Attentive  Affect:  Appropriate  Cognitive:  Appropriate  Insight:  Engaged  Engagement in Therapy:  Engaged  Modes of Intervention:  Discussion, Education, Exploration and Socialization  Summary of Progress/Problems:   LCSW for therapy group introduced the discussion with patients reflecting on  topics and or a stress they could share  openly with the group. Patient was asked to reflect on a barriers  they face and how they reacted, engaged and coped during this situation. Her topic was on pain and she reflected her emotional pain turns into physical pain and Visa versa she struggles. This patient and group were all encouraged to support their peers and ideas from the group was shared. Take medications, walk, play some games to take your mind off things, walk and exercise. The patient agreed she felt supported .   Cheron Schaumann 03/04/2018

## 2018-03-05 LAB — GLUCOSE, CAPILLARY
GLUCOSE-CAPILLARY: 186 mg/dL — AB (ref 70–99)
Glucose-Capillary: 297 mg/dL — ABNORMAL HIGH (ref 70–99)
Glucose-Capillary: 357 mg/dL — ABNORMAL HIGH (ref 70–99)
Glucose-Capillary: 225 mg/dL — ABNORMAL HIGH (ref 70–99)

## 2018-03-05 MED ORDER — INSULIN ASPART PROT & ASPART (70-30 MIX) 100 UNIT/ML ~~LOC~~ SUSP
38.0000 [IU] | Freq: Two times a day (BID) | SUBCUTANEOUS | Status: DC
  Administered 2018-03-05 – 2018-03-06 (×2): 38 [IU] via SUBCUTANEOUS
  Filled 2018-03-05: qty 10

## 2018-03-05 MED ORDER — METFORMIN HCL 500 MG PO TABS
500.0000 mg | ORAL_TABLET | Freq: Two times a day (BID) | ORAL | Status: DC
  Administered 2018-03-05 – 2018-03-07 (×4): 500 mg via ORAL
  Filled 2018-03-05 (×4): qty 1

## 2018-03-05 NOTE — Progress Notes (Signed)
Recreation Therapy Notes  Date: 03/05/2018  Time: 9:30 am   Location: Craft Room   Behavioral response: N/A   Intervention Topic: Problem Solving  Discussion/Intervention: Patient did not attend group.   Clinical Observations/Feedback:  Patient did not attend group.   Fransisca Shawn LRT/CTRS        Kareen Hitsman 03/05/2018 10:26 AM

## 2018-03-05 NOTE — Plan of Care (Signed)
Problem: Medication: Goal: Compliance with prescribed medication regimen will improve Outcome: Progressing   Problem: Education: Goal: Knowledge of [facility] Education information/materials will improve Outcome: Progressing   Problem: Safety: Goal: Ability to disclose and discuss suicidal ideas will improve Outcome: Progressing D: Pt A & O X4. Denies SI, HI and AVH when assessed. Presents with flat affect and irritable mood on interactions. Demanding in reference to pain medication requests "I don't know why she will not just give me just one good pain pill instead of all this Neurontin, Flexaril; that's like Tylenol for me, I does not help me". Visible in milieu at long intervals during shift. Pt did not attend group this morning despite multiple prompts. Observed playing cards in dayroom with peers. Reports fair sleep, good appetite, low energy and good concentration level. Rates her depression 7/10, hopelessness 6/10 and anxiety 8/10 on self inventory sheet. Goal for this shift "take a shower and wash my clothes, plus go to groups". Pt insulin (70/30) was increased from 35-38 units and Metformin added due to increased CBG results. A: Emotional support and availability offered to pt as needed. Pt encouraged to voice concerns, attend to his ADLS and comply with current treatment regimen including groups. Scheduled and PRN medications (Chronic back pain, headache) given as ordered. Writer updated pt on changes made to medication regimen. Q 15 minutes safety checks continues without self harm gestures or outburst.  R: Pt has been medication compliant. Denies adverse drug reactions. Tolerates all PO intake well. Denies concerns at this time. POC maintained for safety and mood stability.

## 2018-03-05 NOTE — Progress Notes (Signed)
Patient stayed in the milieu until bed time. Attended HS group, had a snack then came to the medication room, complaining of anxiety and pain and insomnia. Medications were given Patient stated that "NO medication works for me. I just want that one medication that could take my pain away..." Patient fell asleep later after taking medication. Slept all night and had no other concerns.

## 2018-03-05 NOTE — Progress Notes (Signed)
Inpatient Diabetes Program Recommendations  AACE/ADA: New Consensus Statement on Inpatient Glycemic Control (2015)  Target Ranges:  Prepandial:   less than 140 mg/dL      Peak postprandial:   less than 180 mg/dL (1-2 hours)      Critically ill patients:  140 - 180 mg/dL   Results for Karen Lindsey, Karen Lindsey (MRN [id number]) as of 03/05/2018 07:54  Ref. Range 03/03/2018 07:15 03/03/2018 11:23 03/03/2018 16:25 03/03/2018 20:59  Glucose-Capillary Latest Ref Range: 70 - 99 mg/dL 400 (H)  2 units NOVOLOG +  35 units 70/30 Insulin  196 (H)  3 units NOVOLOG  266 (H)  8 units NOVOLOG +  35 units 70/30 Insulin  232 (H)   Results for Karen Lindsey, Karen Lindsey (MRN [id number]) as of 03/05/2018 07:54  Ref. Range 03/04/2018 07:19 03/04/2018 11:17 03/04/2018 16:10 03/04/2018 20:43  Glucose-Capillary Latest Ref Range: 70 - 99 mg/dL 326 (H)  3 units NOVOLOG +  35 units 70/30 Insulin  217 (H)  5 units NOVOLOG  231 (H)  5 units NOVOLOG +  35 units 70/30 Insulin  243 (H)   Results for Karen Lindsey, Karen Lindsey (MRN [id number]) as of 03/05/2018 07:54  Ref. Range 03/05/2018 07:13  Glucose-Capillary Latest Ref Range: 70 - 99 mg/dL 833 (H)     Home DM Meds: 70/30 Insulin- 42 units BID  Current Insulin orders: 70/30 Insulin- 35 units BID                                       Novolog Moderate Correction Scale/ SSI (0-15 units) TID AC      MD- Please consider increasing 70/30 Insulin to 38 units BID with meals   If patient doesn't have any contraindications for Metformin, could try adding low dose Metformin since Invokana not available (last BMET from 08/19 showed BUN, Creatinine, and GFR WNL):  Metformin 500 mg BID    --Will follow patient during hospitalization--  Ambrose Finland RN, MSN, CDE Diabetes Coordinator Inpatient Glycemic Control Team Team Pager: [phone] (8a-5p)

## 2018-03-05 NOTE — Progress Notes (Signed)
[facility] MD Progress Note  03/05/2018 2:47 PM Karen Lindsey  MRN:  [id number] Subjective:  Pt is still focused on her pain. She is looking forward to getting on Suboxone o help alleviate this. She hopes to go back to work when the pain is under better control. 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 She states that if [place] place of [employer] does not work out then she could possibly stay with a friend but states that this is less than ideal because they will make her watch the baby which she does not want to do. She is more future oriented today. She is sleeping okay but the pain wakes her up a lot. She is out of her room more and socializing with other peers. She seems to have made some friends on the unit. She does not endorse Suicidal thoughts.   Principal Problem: Severe recurrent major depression without psychotic features (HCC) Diagnosis:   Patient Active Problem List   Diagnosis Date Noted  . Severe recurrent major depression without psychotic features (HCC) [F33.2] 02/27/2018    Priority: High  . Tobacco use disorder [F17.200] 03/02/2018  . Suicidal ideation [R45.851] 02/27/2018  . Diabetes (HCC) [E11.9] 02/27/2018  . Chronic pain [G89.29] 02/27/2018  . Narcotic abuse (HCC) [F11.10] 01/24/2018  . Acute delirium [R41.0] 01/24/2018  . DKA (diabetic ketoacidoses) (HCC) [E13.10] 01/23/2018  . AKI (acute kidney injury) (HCC) [N17.9] 01/23/2018  . HTN (hypertension) [I10] 01/23/2018  . Chest pain [R07.9] 11/08/2017   Total Time spent with patient: 20 minutes  Past Psychiatric History: See h&P  Past Medical History:  Past Medical History:  Diagnosis Date  . Arthritis    rheumatoid  . Chronic pain    a. upper/mid back.  . Diabetic peripheral neuropathy (HCC)   . Fatty liver   . Hypertension   . Insulin dependent diabetes mellitus (HCC)    a. Dx ~ 2011.  . Narcotic addiction (HCC)    a. Followed in substance  abuse center in [place] - on chronic methadone.  . Neuropathy   . RA (rheumatoid arthritis) (HCC)   . Tobacco abuse     Past Surgical History:  Procedure Laterality Date  . ABDOMINAL HYSTERECTOMY    . CESAREAN SECTION    . CHOLECYSTECTOMY    . KNEE SURGERY    . MANDIBLE FRACTURE SURGERY     Family History: History reviewed. No pertinent family history. Family Psychiatric  History: See H&P Social History:  Social History   Substance and Sexual Activity  Alcohol Use No   Comment: Hasn't had a drink since 1998     Social History   Substance and Sexual Activity  Drug Use Yes   Comment: Previously abused pain pills. prescribed methadone     Social History   Socioeconomic History  . Marital status: Legally Separated    Spouse name: Not on file  . Number of children: Not on file  . Years of education: Not on file  . Highest education level: Not on file  Occupational History    Comment: Works part-time in Physiological scientist  Social Needs  . Financial resource strain: Not on file  . Food insecurity:    Worry: Not on file    Inability: Not on file  . Transportation needs:    Medical: Not on file    Non-medical: Not on file  Tobacco Use  . Smoking status: Current Every Day  Smoker    Packs/day: 0.50    Types: Cigarettes  . Smokeless tobacco: Never Used  . Tobacco comment: 30+ years  Substance and Sexual Activity  . Alcohol use: No    Comment: Hasn't had a drink since 1998  . Drug use: Yes    Comment: Previously abused pain pills. prescribed methadone   . Sexual activity: Not on file  Lifestyle  . Physical activity:    Days per week: Not on file    Minutes per session: Not on file  . Stress: Not on file  Relationships  . Social connections:    Talks on phone: Not on file    Gets together: Not on file    Attends religious service: Not on file    Active member of club or organization: Not on file    Attends meetings of clubs or organizations: Not on file     Relationship status: Not on file  Other Topics Concern  . Not on file  Social History Narrative   Lives locally with friends/roomates.   Additional Social History:                         Sleep: Fair  Appetite:  Good  Current Medications: Current Facility-Administered Medications  Medication Dose Route Frequency Provider Last Rate Last Dose  . acetaminophen (TYLENOL) tablet 650 mg  650 mg Oral Q6H PRN Clapacs, Jackquline Denmark, MD   650 mg at 03/05/18 1230  . alum & mag hydroxide-simeth (MAALOX/MYLANTA) 200-200-20 MG/5ML suspension 30 mL  30 mL Oral Q4H PRN Clapacs, John T, MD      . amLODipine (NORVASC) tablet 2.5 mg  2.5 mg Oral Daily Clapacs, John T, MD   2.5 mg at 03/05/18 0829  . calcium-vitamin D (OSCAL WITH D) 500-200 MG-UNIT per tablet 2 tablet  2 tablet Oral Q breakfast McNew, Ileene Hutchinson, MD   2 tablet at 03/05/18 0830  . cyclobenzaprine (FLEXERIL) tablet 5 mg  5 mg Oral Q8H PRN Haskell Riling, MD   5 mg at 03/05/18 0835  . DULoxetine (CYMBALTA) DR capsule 60 mg  60 mg Oral Daily McNew, Ileene Hutchinson, MD   60 mg at 03/05/18 0830  . gabapentin (NEURONTIN) capsule 1,200 mg  1,200 mg Oral TID Clapacs, John T, MD   1,200 mg at 03/05/18 1227  . hydrOXYzine (ATARAX/VISTARIL) tablet 50 mg  50 mg Oral TID PRN Haskell Riling, MD   50 mg at 03/04/18 2149  . insulin aspart (novoLOG) injection 0-15 Units  0-15 Units Subcutaneous TID WC Clapacs, Jackquline Denmark, MD   15 Units at 03/05/18 1225  . insulin aspart protamine- aspart (NOVOLOG MIX 70/30) injection 35 Units  35 Units Subcutaneous BID WC McNew, Ileene Hutchinson, MD   35 Units at 03/05/18 0859  . ketorolac (TORADOL) tablet 10 mg  10 mg Oral Q6H PRN Haskell Riling, MD   10 mg at 03/05/18 0912  . lidocaine (LIDODERM) 5 % 1 patch  1 patch Transdermal Q24H McNew, Ileene Hutchinson, MD   1 patch at 03/05/18 1005  . lisinopril (PRINIVIL,ZESTRIL) tablet 20 mg  20 mg Oral Daily Clapacs, Jackquline Denmark, MD   20 mg at 03/05/18 0829  . loperamide (IMODIUM) capsule 2 mg  2 mg Oral PRN  Clapacs, Jackquline Denmark, MD   2 mg at 02/28/18 1837  . magnesium hydroxide (MILK OF MAGNESIA) suspension 30 mL  30 mL Oral Daily PRN Clapacs, Jackquline Denmark, MD      .  multivitamin-lutein (OCUVITE-LUTEIN) capsule 1 capsule  1 capsule Oral Daily McNew, Ileene Hutchinson, MD   1 capsule at 03/05/18 0831  . traZODone (DESYREL) tablet 300 mg  300 mg Oral QHS PRN Pucilowska, Jolanta B, MD   300 mg at 03/04/18 2148    Lab Results:  Results for orders placed or performed during the hospital encounter of 02/27/18 (from the past 48 hour(s))  Glucose, capillary     Status: Abnormal   Collection Time: 03/03/18  4:25 PM  Result Value Ref Range   Glucose-Capillary 266 (H) 70 - 99 mg/dL  Glucose, capillary     Status: Abnormal   Collection Time: 03/03/18  8:59 PM  Result Value Ref Range   Glucose-Capillary 232 (H) 70 - 99 mg/dL   Comment 1 Notify RN   Glucose, capillary     Status: Abnormal   Collection Time: 03/04/18  7:19 AM  Result Value Ref Range   Glucose-Capillary 188 (H) 70 - 99 mg/dL  Glucose, capillary     Status: Abnormal   Collection Time: 03/04/18 11:17 AM  Result Value Ref Range   Glucose-Capillary 217 (H) 70 - 99 mg/dL  Glucose, capillary     Status: Abnormal   Collection Time: 03/04/18  4:10 PM  Result Value Ref Range   Glucose-Capillary 231 (H) 70 - 99 mg/dL  Glucose, capillary     Status: Abnormal   Collection Time: 03/04/18  8:43 PM  Result Value Ref Range   Glucose-Capillary 243 (H) 70 - 99 mg/dL   Comment 1 Notify RN   Glucose, capillary     Status: Abnormal   Collection Time: 03/05/18  7:13 AM  Result Value Ref Range   Glucose-Capillary 297 (H) 70 - 99 mg/dL   Comment 1 Notify RN   Glucose, capillary     Status: Abnormal   Collection Time: 03/05/18 11:18 AM  Result Value Ref Range   Glucose-Capillary 357 (H) 70 - 99 mg/dL    Blood Alcohol level:  Lab Results  Component Value Date   ETH <10 02/26/2018   ETH <10 02/16/2018    Metabolic Disorder Labs: Lab Results  Component Value Date    HGBA1C 10.2 (H) 02/28/2018   MPG 246.04 02/28/2018   MPG 280.48 11/08/2017   No results found for: PROLACTIN Lab Results  Component Value Date   CHOL 223 (H) 02/28/2018   TRIG 173 (H) 02/28/2018   HDL 41 02/28/2018   CHOLHDL 5.4 02/28/2018   VLDL 35 02/28/2018   LDLCALC 147 (H) 02/28/2018    Physical Findings: AIMS:  , ,  ,  ,    CIWA:    COWS:     Musculoskeletal: Strength & Muscle Tone: within normal limits Gait & Station: normal Patient leans: N/A  Psychiatric Specialty Exam: Physical Exam  Nursing note and vitals reviewed.   Review of Systems  All other systems reviewed and are negative.   Blood pressure 122/71, pulse 98, temperature 98.6 F (37 C), temperature source Oral, resp. rate 16, height 5\' 1"  (1.549 m), weight 56.7 kg, SpO2 99 %.Body mass index is 23.62 kg/m.  General Appearance: Casual  Eye Contact:  Good  Speech:  Clear and Coherent  Volume:  Normal  Mood:  Depressed  Affect:  Appropriate  Thought Process:  Coherent and Goal Directed  Orientation:  Full (Time, Place, and Person)  Thought Content:  Logical  Suicidal Thoughts:  No  Homicidal Thoughts:  No  Memory:  Immediate;   Fair  Judgement:  Fair  Insight:  Fair  Psychomotor Activity:  Normal  Concentration:  Concentration: Fair  Recall:  [employer] of Knowledge:  Fair  Language:  Fair  Akathisia:  No      Assets:  Resilience  ADL's:  Intact  Cognition:  WNL  Sleep:  Number of Hours: 7.15     Treatment Plan Summary: 52 yo female admitted due to depression and SI related to chronic pain. She is no longer endorsing SI and is more future oriented. She wants to get on Suboxone and is hopeful because she found out there is various places that offer it on sliding scale cost. She also wants to work once her pain is under better control.   Plan:  MDD -Continue Cymbalta 60 mg daily  Insomnia -Continue trazodone 300 mg qhs  Chronic Pain Gabapentin 1200 mg TID -Toradol and Lidocaine  patches  HTN -Stable  DM -Diabetes coordinator recommends Metformin since Theodis Sato is not available -Increase 70/30 to 38 units BID  Dispo -CSW looking into shelter options   Haskell Riling, MD 03/05/2018, 2:47 PM

## 2018-03-06 LAB — GLUCOSE, CAPILLARY
GLUCOSE-CAPILLARY: 171 mg/dL — AB (ref 70–99)
Glucose-Capillary: 266 mg/dL — ABNORMAL HIGH (ref 70–99)
Glucose-Capillary: 262 mg/dL — ABNORMAL HIGH (ref 70–99)
Glucose-Capillary: 213 mg/dL — ABNORMAL HIGH (ref 70–99)

## 2018-03-06 MED ORDER — AMLODIPINE BESYLATE 2.5 MG PO TABS: 2.5000 mg | ORAL_TABLET | Freq: Every day | ORAL | 0 refills | Status: DC

## 2018-03-06 MED ORDER — TRAZODONE HCL 150 MG PO TABS: 150.0000 mg | ORAL_TABLET | Freq: Every evening | ORAL | 0 refills | Status: DC | PRN

## 2018-03-06 MED ORDER — GABAPENTIN 400 MG PO CAPS: 1200.0000 mg | ORAL_CAPSULE | Freq: Three times a day (TID) | ORAL | 0 refills | Status: DC

## 2018-03-06 MED ORDER — HYDROXYZINE HCL 50 MG PO TABS: 50.0000 mg | ORAL_TABLET | Freq: Three times a day (TID) | ORAL | 0 refills | Status: DC | PRN

## 2018-03-06 MED ORDER — DULOXETINE HCL 60 MG PO CPEP: 60.0000 mg | ORAL_CAPSULE | Freq: Every day | ORAL | 0 refills | Status: DC

## 2018-03-06 MED ORDER — LISINOPRIL 20 MG PO TABS: 20.0000 mg | ORAL_TABLET | Freq: Every day | ORAL | 0 refills | Status: DC

## 2018-03-06 MED ORDER — LIDOCAINE 5 % EX PTCH: 1.0000 | MEDICATED_PATCH | CUTANEOUS | 0 refills | Status: DC

## 2018-03-06 MED ORDER — INSULIN ASPART PROT & ASPART (70-30 MIX) 100 UNIT/ML ~~LOC~~ SUSP
42.0000 [IU] | Freq: Two times a day (BID) | SUBCUTANEOUS | Status: DC
  Administered 2018-03-06 – 2018-03-07 (×2): 42 [IU] via SUBCUTANEOUS
  Filled 2018-03-06 (×3): qty 10

## 2018-03-06 MED ORDER — TRAZODONE HCL 150 MG PO TABS: 150.0000 mg | ORAL_TABLET | Freq: Every evening | ORAL | 1 refills | Status: DC | PRN

## 2018-03-06 MED ORDER — TRAZODONE HCL 100 MG PO TABS
150.0000 mg | ORAL_TABLET | Freq: Every evening | ORAL | Status: DC | PRN
  Administered 2018-03-06: 150 mg via ORAL
  Filled 2018-03-06: qty 2

## 2018-03-06 MED ORDER — HYDROXYZINE HCL 50 MG PO TABS: 50.0000 mg | ORAL_TABLET | Freq: Three times a day (TID) | ORAL | 1 refills | Status: DC | PRN

## 2018-03-06 MED ORDER — KETOROLAC TROMETHAMINE 10 MG PO TABS: 10.0000 mg | ORAL_TABLET | Freq: Four times a day (QID) | ORAL | 0 refills | Status: DC | PRN

## 2018-03-06 MED ORDER — DULOXETINE HCL 60 MG PO CPEP: 60.0000 mg | ORAL_CAPSULE | Freq: Every day | ORAL | 1 refills | Status: DC

## 2018-03-06 MED ORDER — INSULIN ASPART PROT & ASPART (70-30 MIX) 100 UNIT/ML ~~LOC~~ SUSP: 42.0000 [IU] | Freq: Two times a day (BID) | SUBCUTANEOUS | 0 refills | Status: AC

## 2018-03-06 MED ORDER — CYCLOBENZAPRINE HCL 5 MG PO TABS: 5.0000 mg | ORAL_TABLET | Freq: Three times a day (TID) | ORAL | 0 refills | Status: DC | PRN

## 2018-03-06 NOTE — [facility] Group Notes (Signed)
03/06/2018 1PM  Type of Therapy/Topic:  Group Therapy:  Feelings about Diagnosis  Participation Level:  Active   Description of Group:   This group will allow patients to explore their thoughts and feelings about diagnoses they have received. Patients will be guided to explore their level of understanding and acceptance of these diagnoses. Facilitator will encourage patients to process their thoughts and feelings about the reactions of others to their diagnosis and will guide patients in identifying ways to discuss their diagnosis with significant others in their lives. This group will be process-oriented, with patients participating in exploration of their own experiences, giving and receiving support, and processing challenge from other group members.   Therapeutic Goals: 1. Patient will demonstrate understanding of diagnosis as evidenced by identifying two or more symptoms of the disorder 2. Patient will be able to express two feelings regarding the diagnosis 3. Patient will demonstrate their ability to communicate their needs through discussion and/or role play  Summary of Patient Progress: Actively and appropriately engaged in the group. Patient was able to provide support and validation to other group members.Patient practiced active listening when interacting with the facilitator and other group members. Patient reports feeling "suicidal and depressed" before being admitted into the hospital. She reports "I need to learn to talk things out." Patient is still in the process of obtaining treatment goals.        Therapeutic Modalities:   Cognitive Behavioral Therapy Brief Therapy Feelings Identification    Johny Shears, [place] 03/06/2018 2:34 PM

## 2018-03-06 NOTE — Progress Notes (Signed)
[facility] MD Progress Note  03/06/2018 2:04 PM Lucelia Lacey  MRN:  [id number] Subjective:  Pt is still very focused on pain. She states that the lidocaine patches have been helping with the neuropathy to an extent. She is still hopeful to get on Suboxone and plans to follow up with [facility]. She feels nervous because she does not want to stay with the friend but realizes this would be the only option other than a shelter. She will try to [facility] own when she discharges. She plans to reach out to her mom to see if she can borrow some money. She has been getting medical care through charity care with [facility] and plans to contact them on discharge tos ee if she still receives this for her insulin. She does not endorse SI or thoughts of self harm. Her mood is more hopeful. She has been socializing with peers and coming out of her room more. She is sleeping fine and would like to decrease trazodone to 150 mg.   Principal Problem: Severe recurrent major depression without psychotic features (HCC) Diagnosis:   Patient Active Problem List   Diagnosis Date Noted  . Severe recurrent major depression without psychotic features (HCC) [F33.2] 02/27/2018    Priority: High  . Tobacco use disorder [F17.200] 03/02/2018  . Suicidal ideation [R45.851] 02/27/2018  . Diabetes (HCC) [E11.9] 02/27/2018  . Chronic pain [G89.29] 02/27/2018  . Narcotic abuse (HCC) [F11.10] 01/24/2018  . Acute delirium [R41.0] 01/24/2018  . DKA (diabetic ketoacidoses) (HCC) [E13.10] 01/23/2018  . AKI (acute kidney injury) (HCC) [N17.9] 01/23/2018  . HTN (hypertension) [I10] 01/23/2018  . Chest pain [R07.9] 11/08/2017   Total Time spent with patient: 20 minutes  Past Psychiatric History: See H&p  Past Medical History:  Past Medical History:  Diagnosis Date  . Arthritis    rheumatoid  . Chronic pain    a. upper/mid back.  . Diabetic peripheral neuropathy (HCC)   . Fatty liver   . Hypertension   . Insulin  dependent diabetes mellitus (HCC)    a. Dx ~ 2011.  . Narcotic addiction (HCC)    a. Followed in substance abuse center in [place] - on chronic methadone.  . Neuropathy   . RA (rheumatoid arthritis) (HCC)   . Tobacco abuse     Past Surgical History:  Procedure Laterality Date  . ABDOMINAL HYSTERECTOMY    . CESAREAN SECTION    . CHOLECYSTECTOMY    . KNEE SURGERY    . MANDIBLE FRACTURE SURGERY     Family History: History reviewed. No pertinent family history. Family Psychiatric  History: See H&P Social History:  Social History   Substance and Sexual Activity  Alcohol Use No   Comment: Hasn't had a drink since 1998     Social History   Substance and Sexual Activity  Drug Use Yes   Comment: Previously abused pain pills. prescribed methadone     Social History   Socioeconomic History  . Marital status: Legally Separated    Spouse name: Not on file  . Number of children: Not on file  . Years of education: Not on file  . Highest education level: Not on file  Occupational History    Comment: Works part-time in Physiological scientist  Social Needs  . Financial resource strain: Not on file  . Food insecurity:    Worry: Not on file    Inability: Not on file  . Transportation needs:  Medical: Not on file    Non-medical: Not on file  Tobacco Use  . Smoking status: Current Every Day Smoker    Packs/day: 0.50    Types: Cigarettes  . Smokeless tobacco: Never Used  . Tobacco comment: 30+ years  Substance and Sexual Activity  . Alcohol use: No    Comment: Hasn't had a drink since 1998  . Drug use: Yes    Comment: Previously abused pain pills. prescribed methadone   . Sexual activity: Not on file  Lifestyle  . Physical activity:    Days per week: Not on file    Minutes per session: Not on file  . Stress: Not on file  Relationships  . Social connections:    Talks on phone: Not on file    Gets together: Not on file    Attends religious service: Not on file    Active member  of club or organization: Not on file    Attends meetings of clubs or organizations: Not on file    Relationship status: Not on file  Other Topics Concern  . Not on file  Social History Narrative   Lives locally with friends/roomates.   Additional Social History:                         Sleep: Fair  Appetite:  Good  Current Medications: Current Facility-Administered Medications  Medication Dose Route Frequency Provider Last Rate Last Dose  . acetaminophen (TYLENOL) tablet 650 mg  650 mg Oral Q6H PRN Clapacs, Jackquline Denmark, MD   650 mg at 03/05/18 1230  . alum & mag hydroxide-simeth (MAALOX/MYLANTA) 200-200-20 MG/5ML suspension 30 mL  30 mL Oral Q4H PRN Clapacs, John T, MD      . amLODipine (NORVASC) tablet 2.5 mg  2.5 mg Oral Daily Clapacs, John T, MD   2.5 mg at 03/06/18 0804  . calcium-vitamin D (OSCAL WITH D) 500-200 MG-UNIT per tablet 2 tablet  2 tablet Oral Q breakfast Emaline Karnes, Ileene Hutchinson, MD   2 tablet at 03/06/18 0804  . cyclobenzaprine (FLEXERIL) tablet 5 mg  5 mg Oral Q8H PRN Lashona Schaaf, Ileene Hutchinson, MD   5 mg at 03/06/18 1151  . DULoxetine (CYMBALTA) DR capsule 60 mg  60 mg Oral Daily Davari Lopes, Ileene Hutchinson, MD   60 mg at 03/06/18 0803  . gabapentin (NEURONTIN) capsule 1,200 mg  1,200 mg Oral TID Clapacs, John T, MD   1,200 mg at 03/06/18 1150  . hydrOXYzine (ATARAX/VISTARIL) tablet 50 mg  50 mg Oral TID PRN Haskell Riling, MD   50 mg at 03/05/18 2210  . insulin aspart (novoLOG) injection 0-15 Units  0-15 Units Subcutaneous TID WC Clapacs, Jackquline Denmark, MD   8 Units at 03/06/18 1151  . insulin aspart protamine- aspart (NOVOLOG MIX 70/30) injection 42 Units  42 Units Subcutaneous BID WC Jennise Both R, MD      . ketorolac (TORADOL) tablet 10 mg  10 mg Oral Q6H PRN Jaelynn Pozo, Ileene Hutchinson, MD   10 mg at 03/06/18 1012  . lidocaine (LIDODERM) 5 % 1 patch  1 patch Transdermal Q24H Yaiden Yang, Ileene Hutchinson, MD   1 patch at 03/06/18 1011  . lisinopril (PRINIVIL,ZESTRIL) tablet 20 mg  20 mg Oral Daily Clapacs, Jackquline Denmark, MD   20 mg  at 03/06/18 0803  . loperamide (IMODIUM) capsule 2 mg  2 mg Oral PRN Clapacs, Jackquline Denmark, MD   2 mg at 02/28/18 1837  . magnesium hydroxide (  MILK OF MAGNESIA) suspension 30 mL  30 mL Oral Daily PRN Clapacs, John T, MD      . metFORMIN (GLUCOPHAGE) tablet 500 mg  500 mg Oral BID WC Sharonne Ricketts, Ileene Hutchinson, MD   500 mg at 03/06/18 0803  . multivitamin-lutein (OCUVITE-LUTEIN) capsule 1 capsule  1 capsule Oral Daily Channah Godeaux, Ileene Hutchinson, MD   1 capsule at 03/06/18 0803  . traZODone (DESYREL) tablet 150 mg  150 mg Oral QHS PRN Vernestine Brodhead, Ileene Hutchinson, MD        Lab Results:  Results for orders placed or performed during the hospital encounter of 02/27/18 (from the past 48 hour(s))  Glucose, capillary     Status: Abnormal   Collection Time: 03/04/18  4:10 PM  Result Value Ref Range   Glucose-Capillary 231 (H) 70 - 99 mg/dL  Glucose, capillary     Status: Abnormal   Collection Time: 03/04/18  8:43 PM  Result Value Ref Range   Glucose-Capillary 243 (H) 70 - 99 mg/dL   Comment 1 Notify RN   Glucose, capillary     Status: Abnormal   Collection Time: 03/05/18  7:13 AM  Result Value Ref Range   Glucose-Capillary 297 (H) 70 - 99 mg/dL   Comment 1 Notify RN   Glucose, capillary     Status: Abnormal   Collection Time: 03/05/18 11:18 AM  Result Value Ref Range   Glucose-Capillary 357 (H) 70 - 99 mg/dL  Glucose, capillary     Status: Abnormal   Collection Time: 03/05/18  4:21 PM  Result Value Ref Range   Glucose-Capillary 186 (H) 70 - 99 mg/dL   Comment 1 Notify RN   Glucose, capillary     Status: Abnormal   Collection Time: 03/05/18  9:02 PM  Result Value Ref Range   Glucose-Capillary 225 (H) 70 - 99 mg/dL   Comment 1 Notify RN   Glucose, capillary     Status: Abnormal   Collection Time: 03/06/18  7:08 AM  Result Value Ref Range   Glucose-Capillary 266 (H) 70 - 99 mg/dL  Glucose, capillary     Status: Abnormal   Collection Time: 03/06/18 11:33 AM  Result Value Ref Range   Glucose-Capillary 262 (H) 70 - 99 mg/dL     Blood Alcohol level:  Lab Results  Component Value Date   ETH <10 02/26/2018   ETH <10 02/16/2018    Metabolic Disorder Labs: Lab Results  Component Value Date   HGBA1C 10.2 (H) 02/28/2018   MPG 246.04 02/28/2018   MPG 280.48 11/08/2017   No results found for: PROLACTIN Lab Results  Component Value Date   CHOL 223 (H) 02/28/2018   TRIG 173 (H) 02/28/2018   HDL 41 02/28/2018   CHOLHDL 5.4 02/28/2018   VLDL 35 02/28/2018   LDLCALC 147 (H) 02/28/2018    Physical Findings: AIMS: Facial and Oral Movements Muscles of Facial Expression: None, normal Lips and Perioral Area: None, normal Jaw: None, normal Tongue: None, normal,Extremity Movements Upper (arms, wrists, hands, fingers): None, normal Lower (legs, knees, ankles, toes): None, normal, Trunk Movements Neck, shoulders, hips: None, normal, Overall Severity Severity of abnormal movements (highest score from questions above): None, normal Incapacitation due to abnormal movements: None, normal Patient's awareness of abnormal movements (rate only patient's report): No Awareness, Dental Status Current problems with teeth and/or dentures?: No Does patient usually wear dentures?: No  CIWA:    COWS:     Musculoskeletal: Strength & Muscle Tone: within normal limits Gait & Station:  normal Patient leans: N/A  Psychiatric Specialty Exam: Physical Exam  Nursing note and vitals reviewed.   Review of Systems  All other systems reviewed and are negative.   Blood pressure 116/75, pulse 96, temperature 98.6 F (37 C), temperature source Oral, resp. rate 18, height 5\' 1"  (1.549 m), weight 56.7 kg, SpO2 99 %.Body mass index is 23.62 kg/m.  General Appearance: Casual  Eye Contact:  Good  Speech:  Clear and Coherent  Volume:  Normal  Mood:  Depressed  Affect:  Appropriate and Congruent  Thought Process:  Coherent and Goal Directed  Orientation:  Full (Time, Place, and Person)  Thought Content:  Logical  Suicidal  Thoughts:  No  Homicidal Thoughts:  No  Memory:  Immediate;   Fair  Judgement:  Fair  Insight:  Fair  Psychomotor Activity:  Normal  Concentration:  Concentration: Fair  Recall:  Fair  Fund of Knowledge:  Good  Language:  Fair  Akathisia:  No      Assets:  Resilience  ADL's:  Intact  Cognition:  WNL  Sleep:  Number of Hours: 7.15     Treatment Plan Summary: 52 yo female admitted due to SI due to chronic pain. Mood is improving and SI has resolved. She is more hopeful and plans to pursue Suboxone as an outpatient.   Plan:  MDD -Continue Cymbalta 60 mg daily  Insomnia -Decrease Trazodone to 150 mg qhs  Chronic Pain -Gabapentin 1200 mg TID  HTN -Stable  DM -Continue Metformin while in the hospital. She will resume Invokana outpatinet -Increase 70/30 to 42 units BID  Dispo -She will stay with a friend on discharge, Plan for discharge tomorrow Haskell Riling, MD 03/06/2018, 2:04 PM

## 2018-03-06 NOTE — Progress Notes (Signed)
Inpatient Diabetes Program Recommendations  AACE/ADA: New Consensus Statement on Inpatient Glycemic Control (2019)  Target Ranges:  Prepandial:   less than 140 mg/dL      Peak postprandial:   less than 180 mg/dL (1-2 hours)      Critically ill patients:  140 - 180 mg/dL   Results for JENNALYN, CAWLEY VEE (MRN [id number]) as of 03/06/2018 08:08  Ref. Range 03/05/2018 07:13 03/05/2018 11:18 03/05/2018 16:21 03/05/2018 21:02 03/06/2018 07:08  Glucose-Capillary Latest Ref Range: 70 - 99 mg/dL 633 (H) 354 (H) 562 (H) 225 (H) 266 (H)   Review of Glycemic Control  Diabetes history: DM2 Outpatient Diabetes medications: 70/30 42 units BID Current orders for Inpatient glycemic control: 70/30 38 units BID, Novolog 0-15 units TID with meals, Metformin 500 mg BID  Inpatient Diabetes Program Recommendations:  Insulin - Basal: Please consider increasing 70/30 to 42 units BID.  Thanks, Orlando Penner, RN, MSN, CDE Diabetes Coordinator Inpatient Diabetes Program [phone] (Team Pager from 8am to 5pm)

## 2018-03-06 NOTE — Plan of Care (Signed)
Working on Pharmacologist . Verbalize  understanding  of medication given . Emotional and mental status improved . Understanding of information received  denies suicidal  concerns   Problem: Safety: Goal: Ability to disclose and discuss suicidal ideas will improve Outcome: Progressing   Problem: Education: Goal: Mental status will improve Outcome: Progressing   Problem: Education: Goal: Emotional status will improve Outcome: Progressing   Problem: Education: Goal: Knowledge of [facility] Education information/materials will improve Outcome: Progressing   Problem: Safety: Goal: Ability to remain free from injury will improve Outcome: Progressing

## 2018-03-06 NOTE — Progress Notes (Signed)
Recreation Therapy Notes   Date: 03/06/2018  Time: 9:30 am  Location: Craft Room  Behavioral response: Appropriate   Intervention Topic: Communication  Discussion/Intervention:  Group content today was focused on communication. The group defined communication and ways to communicate with others. Individuals stated reason why communication is important and some reasons to communicate with others. Patients expressed if they thought they were good at communicating with others and ways they could improve their communication skills. The group identified important parts of communication and some experiences they have had in the past with communication. The group participated in the intervention "What is that?", where they had a chance to test out their communication skills and identify ways to improve their communication techniques.  .  Clinical Observations/Feedback:  Patient came to group late due to unknown reasons. Individual was social with peers and staff while participating in group. [name] LRT/CTRS         Kalab Camps 03/06/2018 12:02 PM

## 2018-03-06 NOTE — Discharge Instructions (Signed)
Regarding Insulin:  Your dose was increased to 42 unit twice a day as the hospital did not have Invokana. If you resume Invokana outpatient, you may need to decrease insulin dose back to initial dose to 25 units twice a day. Please follow up with your PCP regarding this  Regarding Suboxone: Here are a list of providers that offer Suboxone on a sliding scale:  1.  [facility] at [phone]- they do have a Subutex program that requires daily dosing and participation in their IOP group.  The agency utilizes a sliding scale fee that is income based.  If someone is interested in the program they must come to the agency's office and meet with Council Mechanic, who is the MAT Therapist, sports.  He is available Mon-Friday between the hours of 9A-4P.  Council Mechanic will conduct a triage interview and will decide if someone is appropriate for the services.  If appropriate an intake/assessment appointment will be scheduled.  2.  [facility]-CSW informed that anyone interested in the Suboxone program who does not have medical insurance can be accepted into the program using a sliding fee scale based on income.  If client is deemed appropriate for services, following an assessment, then the client will be placed on their waitlist which has about 48 people on it currently.  3.  [facility] ([place], [place] office at [phone] informed that if a person has no medical insurance they can still receive Suboxone services.  They must first walk-in and have an assessment.  If deemed appropriate for the program they must also participated int he IOP which meets 3X week for 12 weeks.  The client will be given a prescription that they can get filled at the pharmacy at [facility].  They do have a medication assistance program to assist with the costs of the medication.  4.  [facility] office at [employer] agency provides both Suboxone and Subutex, but Subutex is only given to  clients who are allergic to Suboxone or who are pregnant.  The agency uses a sliding scale fee if a client does not have medical insurance.  They will be required to have a letter of support from someone (can be someone who is providing them any financial support).  Following an assessment and deemed appropriate for the program the client will receive a prescription for the Suboxone.  They client is responsible for the cost of the prescription.  They did not mention providing any assistance with payment of the medication.

## 2018-03-06 NOTE — Progress Notes (Signed)
Patient ID: Karen Lindsey, female   DOB: 08-20-1965, 52 y.o.   MRN: [id number]  CSW met with Pt and provided [name] for her to pursue if she felt like that was the best option. She shares she has reached out to her mother and daughter and will see if they are willing to assist.  She still wants to follow up with [name] but is anxious about her living situation and the funding for suboxxone treatment.  CSW informed her that I was unsure if the [place] would be able to pay fees for [name] at this time and asked her to call them and see if they couldn't work out a deal.  Dossie Arbour, LCSW

## 2018-03-06 NOTE — Progress Notes (Signed)
D: Patient stated slept good last night .Stated appetite is good and energy level  Is normal. Stated concentration is good . Stated on Depression scale 7, hopeless 6 and anxiety  8( low 0-10 high) Denies suicidal  homicidal ideations  .  No auditory hallucinations  No pain concerns . Appropriate ADL'S. Interacting with peers and staff. Constantly  With complaints of  Pain   A: Encourage patient participation with unit programming . Instruction  Given on Working on coping skills . Verbalize  understanding  of medication given . Emotional and mental status improved . Understanding of information received  denies suicidal  concerns   Medication , verbalize understanding. R: Voice no other concerns. Staff continue to monitor

## 2018-03-06 NOTE — [facility] Group Notes (Signed)
[facility] Group Notes:  (Nursing/MHT/Case Management/Adjunct)  Date:  03/06/2018  Time:  12:36 AM  Type of Therapy:  Group Therapy  Participation Level:  Minimal   Summary of Progress/Problems: Left shortly after group started to get CBG taken.  [name] 03/06/2018, 12:36 AM

## 2018-03-06 NOTE — Progress Notes (Signed)
D: Patient on phone  Attempting to find placement at [facility] . Patient left information  Via voice mail . When patient attempted to call the second time  Patient  Stated they hung  Up on her . A: noted to get upset  R: staff continue to encourage  Positivevity

## 2018-03-06 NOTE — [facility] Group Notes (Signed)
[facility] Group Notes:  (Nursing/MHT/Case Management/Adjunct)  Date:  03/06/2018  Time:  9:30 PM  Type of Therapy:  Group Therapy  Participation Level:  Active  Participation Quality:  Appropriate  Affect:  Appropriate  Cognitive:  Alert  Insight:  Appropriate  Engagement in Group:  Engaged  Modes of Intervention:  Support  Summary of Progress/Problems:  Karen Lindsey 03/06/2018, 9:30 PM

## 2018-03-07 LAB — GLUCOSE, CAPILLARY: Glucose-Capillary: 204 mg/dL — ABNORMAL HIGH (ref 70–99)

## 2018-03-07 MED ORDER — TRAZODONE HCL 150 MG PO TABS: 150.0000 mg | ORAL_TABLET | Freq: Every evening | ORAL | 1 refills | Status: AC | PRN

## 2018-03-07 MED ORDER — LIDOCAINE 5 % EX PTCH: 1.0000 | MEDICATED_PATCH | CUTANEOUS | 0 refills | Status: DC

## 2018-03-07 MED ORDER — HYDROXYZINE HCL 50 MG PO TABS: 50.0000 mg | ORAL_TABLET | Freq: Three times a day (TID) | ORAL | 1 refills | Status: AC | PRN

## 2018-03-07 MED ORDER — GABAPENTIN 400 MG PO CAPS: 1200.0000 mg | ORAL_CAPSULE | Freq: Three times a day (TID) | ORAL | 1 refills | Status: AC

## 2018-03-07 MED ORDER — DULOXETINE HCL 60 MG PO CPEP: 60.0000 mg | ORAL_CAPSULE | Freq: Every day | ORAL | 1 refills | Status: AC

## 2018-03-07 MED ORDER — KETOROLAC TROMETHAMINE 10 MG PO TABS: 10.0000 mg | ORAL_TABLET | Freq: Four times a day (QID) | ORAL | 0 refills | Status: DC | PRN

## 2018-03-07 MED ORDER — AMLODIPINE BESYLATE 2.5 MG PO TABS: 2.5000 mg | ORAL_TABLET | Freq: Every day | ORAL | 1 refills | Status: AC

## 2018-03-07 MED ORDER — CYCLOBENZAPRINE HCL 5 MG PO TABS: 5.0000 mg | ORAL_TABLET | Freq: Three times a day (TID) | ORAL | 0 refills | Status: AC | PRN

## 2018-03-07 MED ORDER — LISINOPRIL 20 MG PO TABS: 20.0000 mg | ORAL_TABLET | Freq: Every day | ORAL | 1 refills | Status: AC

## 2018-03-07 NOTE — Progress Notes (Signed)
Patient programmed in evening activities and remained cooperative. Interacted with staff and peers appropriately. Alert and oriented and denying thoughts of self harm. Continues to request pain medications, reporting that her pain will never go anywhere. Attended group, had a snack and presented to the medication room for HS medications. Currently in bed sleeping and staff continue to monitor for safety.

## 2018-03-07 NOTE — Progress Notes (Addendum)
D: Patient is aware of  Discharge this shift .Patient denies suicidal /homicidal ideations. Patient received all belongings brought in  A: Received  Storage medications. Writer reviewed Discharge Summary, Suicide Risk Assessment, and Transitional Record. Patient also received Prescriptions   from  MD. A 7 day supply of medications given to patient . Aware  Of follow up appointment . R: Patient left unit with no questions  Or concerns with  Friend

## 2018-03-07 NOTE — Discharge Summary (Signed)
Physician Discharge Summary Note  Patient:  Karen Lindsey is an 52 y.o., female MRN:  [id number] DOB:  1965/09/03 Patient phone:  [phone] (home)  Patient address:   [address] [place] [address],  Total Time spent with patient: 20 minutes Plus 20 minutes of medication reconciliation, discharge planning, and discharge documentation  Date of Admission:  02/27/2018 Date of Discharge: 03/07/18  Reason for Admission:  52 yo female admitted due to worsening depression and suicidal thoughts. She has history of chronic pain from neuropathy and rheumatoid arthritis. She has long history of opiate abuse and was on Methadone for 15 years. She stopped this cold [place] about a month ago and developed AMS and was hospitalized medically at that time. She states taht she got angry at the methadone clinic because they told her that she could have been getting it for free but she was paying for it. She decided to stop taking it and was on 110 mg daily. She states that since then she has been having severe pain in her joints and neuropathy dow her legs. This has caused severe depression and began having suicidal thoughts. She states that she went to [facility] and told them that she wanted to cut her wrists so they sent her to the ED. She is also having a lot of family stress and does not have much support. Her father has cancer and is not doing well. She has a daughter that she talks to but she lives in [place].Her other daughter is not speaking to her which is hard on her.  Pt is homeless and has been staying with a friend but she wore out her welcome there. She has been unable to work because of the pain but wants to work. She was applying for disability but has not worked on it since her hospitalization in July. She feels hopeless because of the pain. She did take some pain medication from a friend this past weekend because she could not function. She states that she is not able to sleep well and appetite is  low. She reports vague AH of "sounds sometimes." She reported VH when she was hospitalized medically and was in delirium. She gets her insulin and medical care through [facility] charity care. Pt states that she would like to get on Suboxone to help with addiction and pain which she learned from a friend. She does not want to get on Methadone again because of the withdrawal symptoms. She was on Cymbalta 30 mg and is open to increasing the dose to target pain. Pt appears depressed and tearful today.   Principal Problem: Severe recurrent major depression without psychotic features [facility]) Discharge Diagnoses: Patient Active Problem List   Diagnosis Date Noted  . Severe recurrent major depression without psychotic features (HCC) [F33.2] 02/27/2018    Priority: High  . Tobacco use disorder [F17.200] 03/02/2018  . Suicidal ideation [R45.851] 02/27/2018  . Diabetes (HCC) [E11.9] 02/27/2018  . Chronic pain [G89.29] 02/27/2018  . Narcotic abuse (HCC) [F11.10] 01/24/2018  . Acute delirium [R41.0] 01/24/2018  . DKA (diabetic ketoacidoses) (HCC) [E13.10] 01/23/2018  . AKI (acute kidney injury) (HCC) [N17.9] 01/23/2018  . HTN (hypertension) [I10] 01/23/2018  . Chest pain [R07.9] 11/08/2017    Past Psychiatric History: see h&P  Past Medical History:  Past Medical History:  Diagnosis Date  . Arthritis    rheumatoid  . Chronic pain    a. upper/mid back.  . Diabetic peripheral neuropathy (HCC)   . Fatty liver   .  Hypertension   . Insulin dependent diabetes mellitus (HCC)    a. Dx ~ 2011.  . Narcotic addiction (HCC)    a. Followed in substance abuse center in [place] - on chronic methadone.  . Neuropathy   . RA (rheumatoid arthritis) (HCC)   . Tobacco abuse     Past Surgical History:  Procedure Laterality Date  . ABDOMINAL HYSTERECTOMY    . CESAREAN SECTION    . CHOLECYSTECTOMY    . KNEE SURGERY    . MANDIBLE FRACTURE SURGERY     Family History: History reviewed. No pertinent family  history. Family Psychiatric  History: See H&p Social History:  Social History   Substance and Sexual Activity  Alcohol Use No   Comment: Hasn't had a drink since 1998     Social History   Substance and Sexual Activity  Drug Use Yes   Comment: Previously abused pain pills. prescribed methadone     Social History   Socioeconomic History  . Marital status: Legally Separated    Spouse name: Not on file  . Number of children: Not on file  . Years of education: Not on file  . Highest education level: Not on file  Occupational History    Comment: Works part-time in Physiological [employer]  Social Needs  . Financial resource strain: Not on file  . Food insecurity:    Worry: Not on file    Inability: Not on file  . Transportation needs:    Medical: Not on file    Non-medical: Not on file  Tobacco Use  . Smoking status: Current Every Day Smoker    Packs/day: 0.50    Types: Cigarettes  . Smokeless tobacco: Never Used  . Tobacco comment: 30+ years  Substance and Sexual Activity  . Alcohol use: No    Comment: Hasn't had a drink since 1998  . Drug use: Yes    Comment: Previously abused pain pills. prescribed methadone   . Sexual activity: Not on file  Lifestyle  . Physical activity:    Days per week: Not on file    Minutes per session: Not on file  . Stress: Not on file  Relationships  . Social connections:    Talks on phone: Not on file    Gets together: Not on file    Attends religious service: Not on file    Active member of club or organization: Not on file    Attends meetings of clubs or organizations: Not on file    Relationship status: Not on file  Other Topics Concern  . Not on file  Social History Narrative   Lives locally with friends/roomates.    Hospital Course:   Pt was restarted on home medications and Cymbalta was increased to 60 mg daily for depression and pain. She perseverated on chronic pain during hospitalization. She was started on Toradol and lidocaine  patches for short term until she gets started on Suboxone as an outpatient. During her stay, she participated well in groups. She was social with other peers on the unit and made some friends. She reported feeling more hopeful now that she can get set up with Suboxone but still depressed due to homelessness. She plans o stay with a friend on discharge. On day of discharge, she reported that she was going to [facility] right after discharge to do initial intake for Suboxone. She requested information for both [facility] and [facility] to call them for a place to stay. IN the  meantime, she will stay with a friend. She is more future oriented and wants to work eventually when her pain is under better control. She consistency denied SI or thoughts of self harm. She felt safe to discharge home today. Her treatment team felt that she gained maximum benefit from hospitalization.   The patient is at low risk of imminent suicide. Patient denied thoughts, intent, or plan for harm to self or others, expressed significant future orientation, and expressed an ability to mobilize assistance for her needs. She is presently void of any contributing psychiatric symptoms, cognitive difficulties, or substance use which would elevate her risk for lethality. Chronic risk for lethality is elevated in light of chronic pain, homelessness. The chronic risk is presently mitigated by her ongoing desire and engagement in [facility] treatment and mobilization of support from family and friends. Chronic risk may elevate if he/she experiences any significant loss or worsening of symptoms, which can be managed and monitored through outpatient providers. At this time,a cute risk for lethality is low and she is stable for ongoing outpatient management.   Modifiable risk factors were addressed during this hospitalization through appropriate pharmacotherapy and establishment of outpatient follow-up treatment. Some risk factors for suicide are situational  (i.e. Unstable housing) or related personality pathology (i.e. Poor coping mechanisms) and thus cannot be further mitigated by continued hospitalization in this setting.    Physical Findings: AIMS: Facial and Oral Movements Muscles of Facial Expression: None, normal Lips and Perioral Area: None, normal Jaw: None, normal Tongue: None, normal,Extremity Movements Upper (arms, wrists, hands, fingers): None, normal Lower (legs, knees, ankles, toes): None, normal, Trunk Movements Neck, shoulders, hips: None, normal, Overall Severity Severity of abnormal movements (highest score from questions above): None, normal Incapacitation due to abnormal movements: None, normal Patient's awareness of abnormal movements (rate only patient's report): No Awareness, Dental Status Current problems with teeth and/or dentures?: No Does patient usually wear dentures?: No  CIWA:    COWS:     Musculoskeletal: Strength & Muscle Tone: within normal limits Gait & Station: normal Patient leans: N/A  Psychiatric Specialty Exam: Physical Exam  Nursing note and vitals reviewed.   Review of Systems  All other systems reviewed and are negative.   Blood pressure 122/81, pulse (!) 107, temperature 98.7 F (37.1 C), temperature source Oral, resp. rate 18, height 5\' 1"  (1.549 m), weight 56.7 kg, SpO2 99 %.Body mass index is 23.62 kg/m.  General Appearance: Casual  Eye Contact:  Good  Speech:  Clear and Coherent  Volume:  Normal  Mood:  Depressed due to pain  Affect:  Congruent  Thought Process:  Coherent and Goal Directed  Orientation:  Full (Time, Place, and Person)  Thought Content:  Logical  Suicidal Thoughts:  No  Homicidal Thoughts:  No  Memory:  Immediate;   Fair  Judgement:  Fair  Insight:  Fair  Psychomotor Activity:  Normal  Concentration:  Concentration: Fair  Recall:  of Knowledge:  Fair  Language:  Fair  Akathisia:  No      Assets:  Resilience  ADL's:  Intact  Cognition:   WNL  Sleep:  Number of Hours: 6     Have you used any form of tobacco in the last 30 days? (Cigarettes, Smokeless Tobacco, Cigars, and/or Pipes): Yes  Has this patient used any form of tobacco in the last 30 days? (Cigarettes, Smokeless Tobacco, Cigars, and/or Pipes) Yes, Yes, A prescription for an FDA-approved tobacco cessation medication was offered at discharge  and the patient refused  Blood Alcohol level:  Lab Results  Component Value Date   ETH <10 02/26/2018   ETH <10 02/16/2018    Metabolic Disorder Labs:  Lab Results  Component Value Date   HGBA1C 10.2 (H) 02/28/2018   MPG 246.04 02/28/2018   MPG 280.48 11/08/2017   No results found for: PROLACTIN Lab Results  Component Value Date   CHOL 223 (H) 02/28/2018   TRIG 173 (H) 02/28/2018   HDL 41 02/28/2018   CHOLHDL 5.4 02/28/2018   VLDL 35 02/28/2018   LDLCALC 147 (H) 02/28/2018    See Psychiatric Specialty Exam and Suicide Risk Assessment completed by Attending Physician prior to discharge.  Discharge destination:  Self care  Is patient on multiple antipsychotic therapies at discharge:  No   Has Patient had three or more failed trials of antipsychotic monotherapy by history:  No  Recommended Plan for Multiple Antipsychotic Therapies: NA  Discharge Instructions    Increase activity slowly   Complete by:  As directed      Allergies as of 03/07/2018      Reactions   Ultram [tramadol Hcl] Palpitations      Medication List    STOP taking these medications   amitriptyline 10 MG tablet Commonly known as:  ELAVIL   hydrOXYzine 25 MG capsule Commonly known as:  VISTARIL   loperamide 2 MG capsule Commonly known as:  IMODIUM   methocarbamol 750 MG tablet Commonly known as:  ROBAXIN   mupirocin ointment 2 % Commonly known as:  BACTROBAN   ondansetron 4 MG tablet Commonly known as:  ZOFRAN   protein supplement shake Liqd Commonly known as:  PREMIER PROTEIN   zolpidem 5 MG tablet Commonly known as:   AMBIEN     TAKE these medications     Indication  amLODipine 2.5 MG tablet Commonly known as:  NORVASC Take 1 tablet (2.5 mg total) by mouth daily.  Indication:  High Blood Pressure Disorder   cyclobenzaprine 5 MG tablet Commonly known as:  FLEXERIL Take 1 tablet (5 mg total) by mouth every 8 (eight) hours as needed for muscle spasms.  Indication:  Muscle Spasm   DULoxetine 60 MG capsule Commonly known as:  CYMBALTA Take 1 capsule (60 mg total) by mouth daily. What changed:    medication strength  how much to take  Another medication with the same name was removed. Continue taking this medication, and follow the directions you see here.  Indication:  Major Depressive Disorder   gabapentin 400 MG capsule Commonly known as:  NEURONTIN Take 3 capsules (1,200 mg total) by mouth 3 (three) times daily.  Indication:  Neuropathic Pain   hydrOXYzine 50 MG tablet Commonly known as:  ATARAX/VISTARIL Take 1 tablet (50 mg total) by mouth 3 (three) times daily as needed for anxiety.  Indication:  Feeling Anxious   insulin aspart protamine- aspart (70-30) 100 UNIT/ML injection Commonly known as:  NOVOLOG MIX 70/30 Inject 0.42 mLs (42 Units total) into the skin 2 (two) times daily with a meal.  Indication:  Type 2 Diabetes   INVOKANA 100 MG Tabs tablet Generic drug:  canagliflozin Take by mouth daily before breakfast.  Indication:  Type 2 Diabetes   ketorolac 10 MG tablet Commonly known as:  TORADOL Take 1 tablet (10 mg total) by mouth every 6 (six) hours as needed for severe pain.  Indication:  Moderate to Severe Acute Pain   lidocaine 5 % Commonly known as:  LIDODERM Place 1  patch onto the skin daily. Remove & Discard patch within 12 hours or as directed by MD  Indication:  Neuropathy   lisinopril 20 MG tablet Commonly known as:  PRINIVIL,ZESTRIL Take 1 tablet (20 mg total) by mouth daily.  Indication:  High Blood Pressure Disorder   traZODone 150 MG tablet Commonly  known as:  DESYREL Take 1 tablet (150 mg total) by mouth at bedtime as needed for sleep.  Indication:  Trouble Sleeping      Follow-up Information    [employer] Go on 03/08/2018.   Why:  9:00am begining [facility] program, keep in touch with Mr. Unk Pinto Peer support services, [phone] Contact information: [address] [place] [address] [phone]             Signed: Haskell Riling, MD 03/07/2018, 2:06 PM

## 2018-03-07 NOTE — Plan of Care (Signed)
Active in the milieu and compliant with treatment

## 2018-03-07 NOTE — [facility] Suicide Risk Assessment (Signed)
[facility] Discharge Suicide Risk Assessment   Principal Problem: Severe recurrent major depression without psychotic features [facility]) Discharge Diagnoses:  Patient Active Problem List   Diagnosis Date Noted  . Severe recurrent major depression without psychotic features (HCC) [F33.2] 02/27/2018    Priority: High  . Tobacco use disorder [F17.200] 03/02/2018  . Suicidal ideation [R45.851] 02/27/2018  . Diabetes (HCC) [E11.9] 02/27/2018  . Chronic pain [G89.29] 02/27/2018  . Narcotic abuse (HCC) [F11.10] 01/24/2018  . Acute delirium [R41.0] 01/24/2018  . DKA (diabetic ketoacidoses) (HCC) [E13.10] 01/23/2018  . AKI (acute kidney injury) (HCC) [N17.9] 01/23/2018  . HTN (hypertension) [I10] 01/23/2018  . Chest pain [R07.9] 11/08/2017      Mental Status Per Nursing Assessment::   On Admission:  Self-harm thoughts  Demographic Factors:  Caucasian, Low socioeconomic status and Unemployed  Loss Factors: Financial problems/change in socioeconomic status  Historical Factors: Impulsivity  Risk Reduction Factors:   Positive coping skills or problem solving skills  Continued Clinical Symptoms:  Previous Psychiatric Diagnoses and Treatments Medical Diagnoses and Treatments/Surgeries Chronic Pain  Cognitive Features That Contribute To Risk:  None    Suicide Risk:  Minimal: No identifiable suicidal ideation.  Patients presenting with no risk factors but with morbid ruminations; may be classified as minimal risk based on the severity of the depressive symptoms     Haskell Riling, MD 03/07/2018, 9:35 AM

## 2018-03-07 NOTE — Progress Notes (Signed)
Recreation Therapy Notes  INPATIENT RECREATION TR PLAN  Patient Details Name: Karen Lindsey MRN: [id number] DOB: 1966-03-02 Today's Date: 03/07/2018  Rec Therapy Plan Is patient appropriate for Therapeutic Recreation?: Yes Treatment times per week: at least 3 Estimated Length of Stay: 5-7 days TR Treatment/Interventions: Group participation (Comment)  Discharge Criteria Pt will be discharged from therapy if:: Discharged Treatment plan/goals/alternatives discussed and agreed upon by:: Patient/family  Discharge Summary Short term goals set: Patient will engage in groups without prompting or encouragement from LRT x3 group sessions within 5 recreation therapy group sessions Short term goals met: Not met Progress toward goals comments: Groups attended Which groups?: Communication Reason goals not met: Patient spent most of her time in her room Therapeutic equipment acquired: N/A Reason patient discharged from therapy: Discharge from hospital Pt/family agrees with progress & goals achieved: Yes Date patient discharged from therapy: 03/07/18   Karen Lindsey 03/07/2018, 12:38 PM

## 2018-03-07 NOTE — Progress Notes (Signed)
Recreation Therapy Notes  Date: 03/08/2018  Time: 9:30 am   Location: Craft Room   Behavioral response: N/A   Intervention Topic: Emotions  Discussion/Intervention: Patient did not attend group.   Clinical Observations/Feedback:  Patient did not attend group.   Howie Rufus LRT/CTRS        Braulio Kiedrowski 03/07/2018 10:22 AM

## 2018-03-13 ENCOUNTER — Ambulatory Visit: Payer: Self-pay | Admitting: Nurse Practitioner

## 2018-03-14 ENCOUNTER — Encounter: Payer: Self-pay | Admitting: Nurse Practitioner

## 2018-03-27 ENCOUNTER — Telehealth: Payer: Self-pay

## 2018-03-27 NOTE — Telephone Encounter (Signed)
No show letter returned to sender    

## 2018-04-10 IMAGING — US US EXTREM LOW VENOUS*L*
1 series · 13 of 24 positions shown · non-contrast
Comparison: None.

CLINICAL DATA: Left lower extremity pain, discoloration. Post
trauma.



[Series 1: us extrem low venous*left* · 0.05mm/px · 13 of 34 slices shown]
[im 1/34]
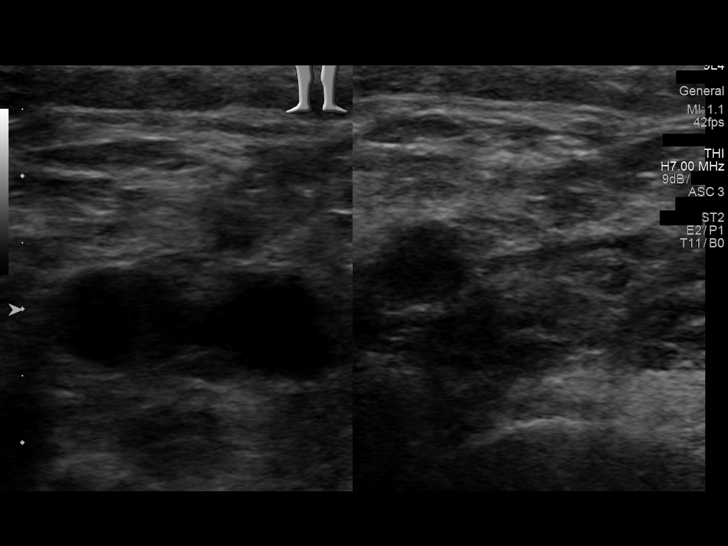
[im 3/34]
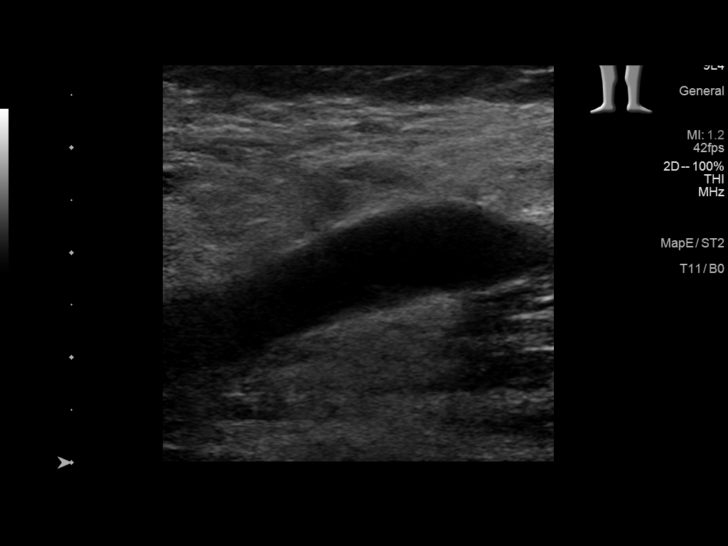
[im 6/34]
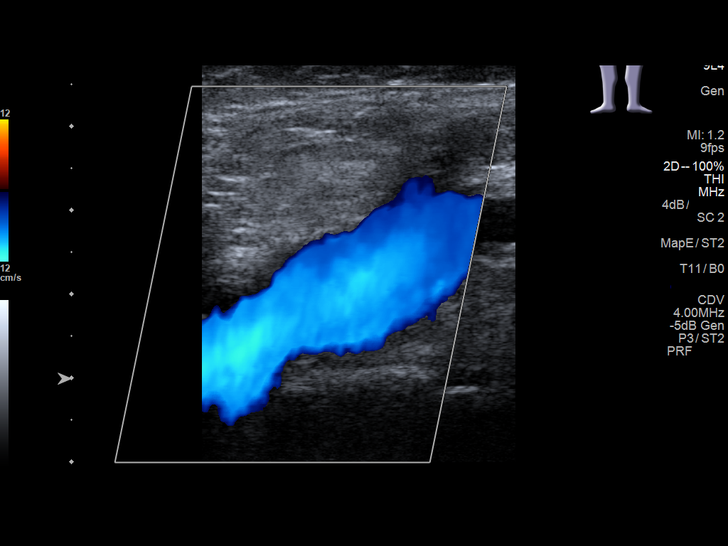
[im 9/34]
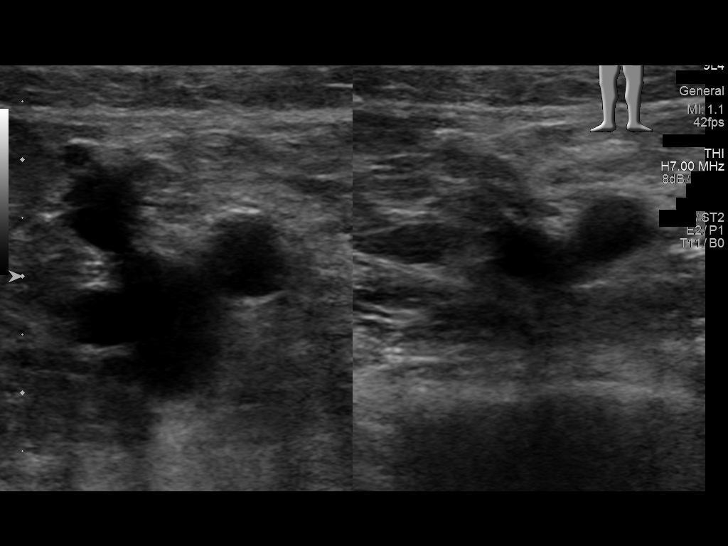
[im 12/34]
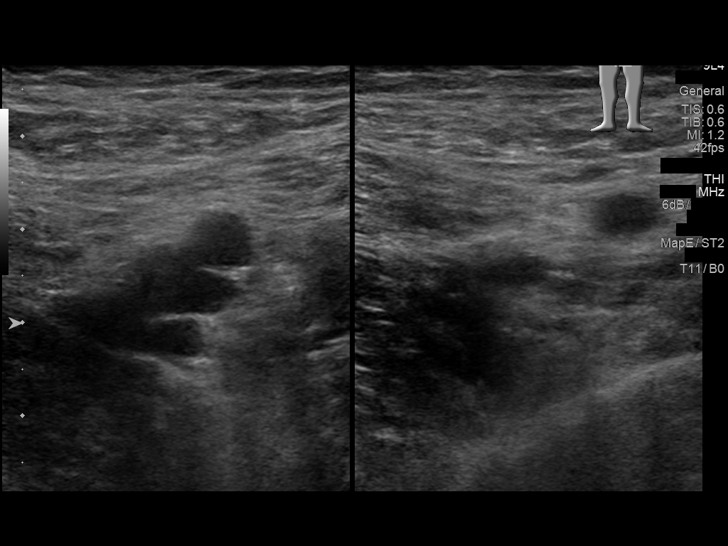
[im 15/34]
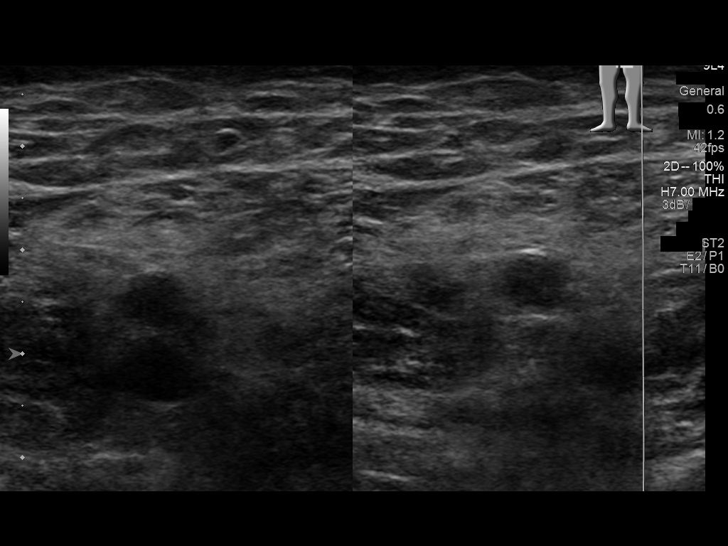
[im 18/34]
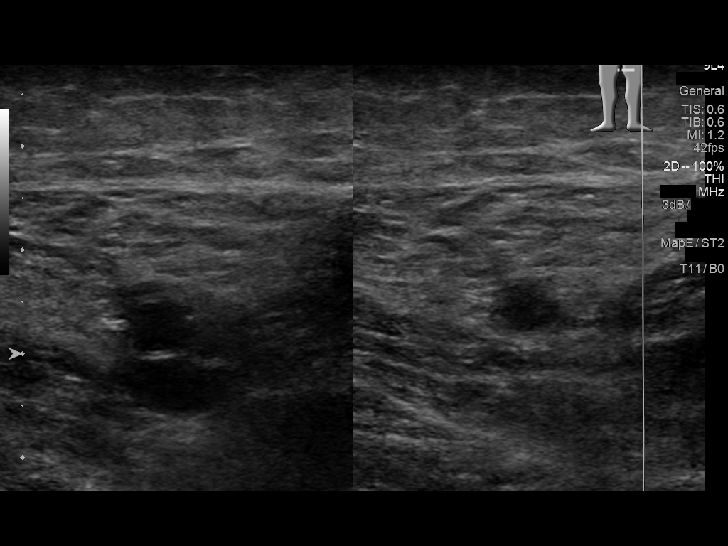
[im 19/34]
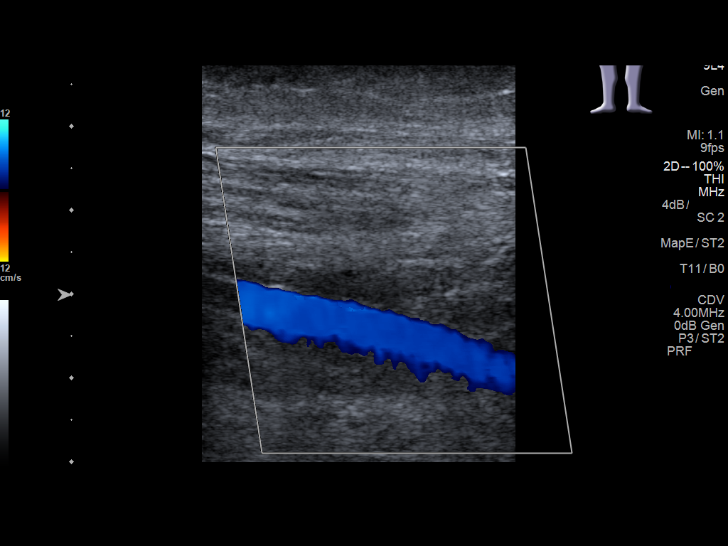
[im 22/34]
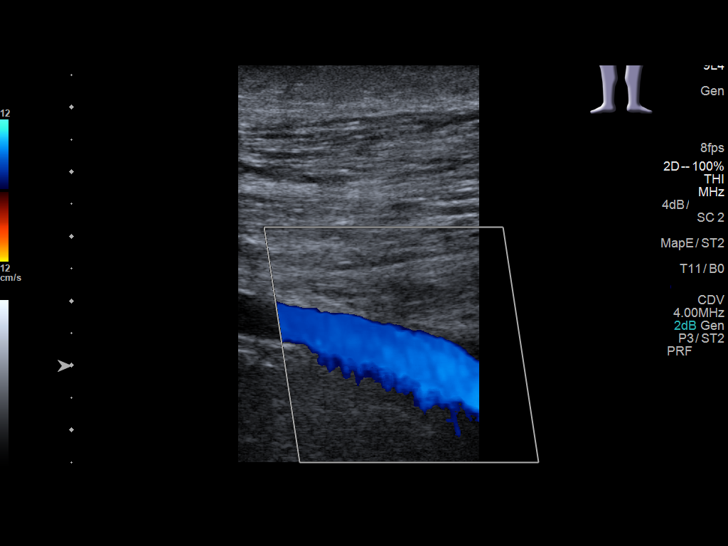
[im 25/34]
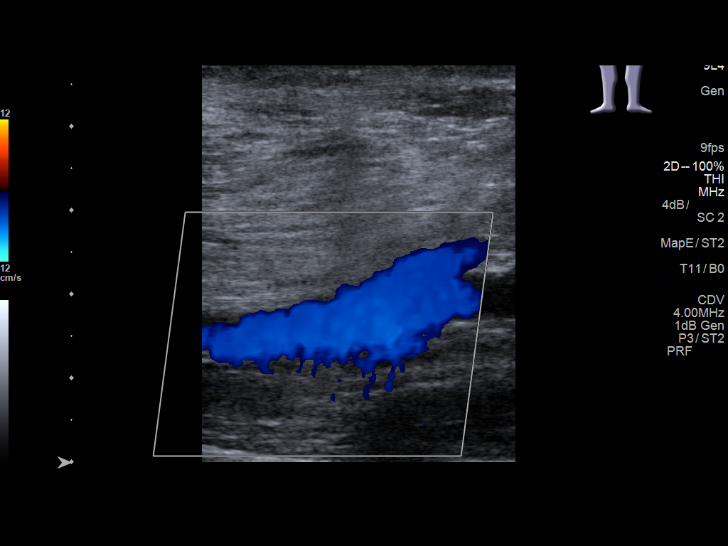
[im 28/34]
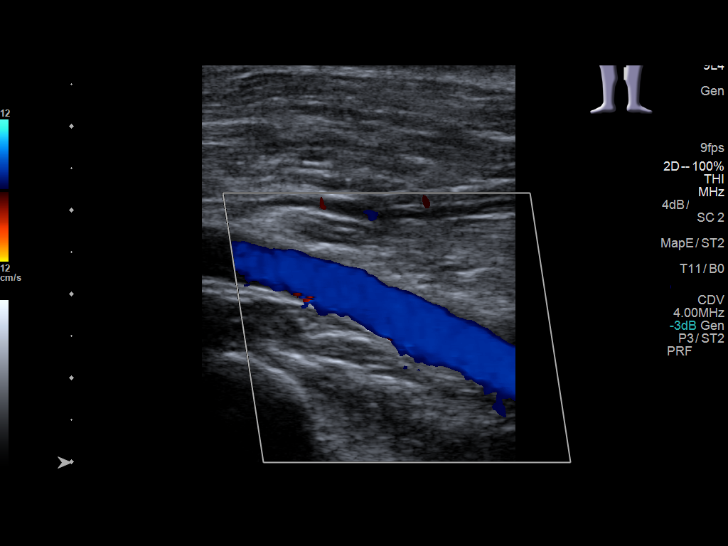
[im 31/34]
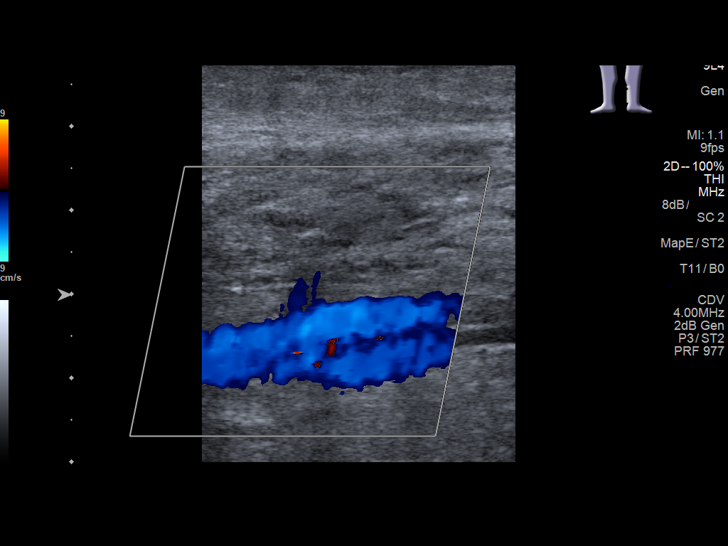
[im 34/34]
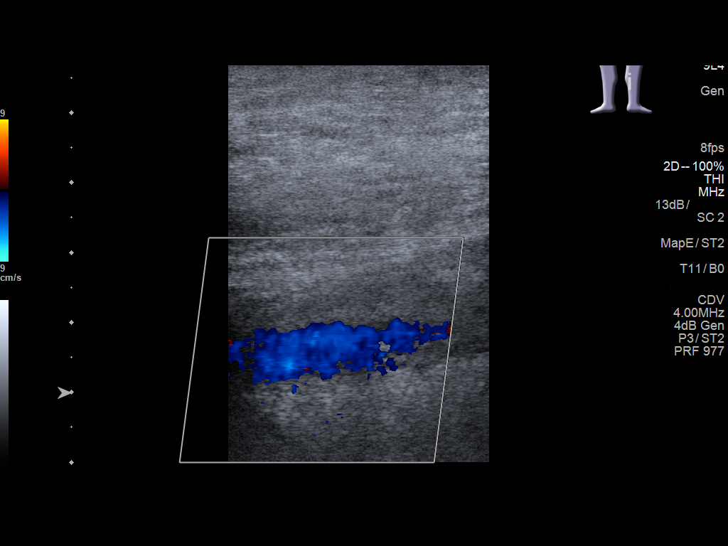

[13 of 24 positions shown; findings below may reference images not displayed]

FINDINGS: Contralateral Common Femoral Vein: Respiratory phasicity is normal
and symmetric with the symptomatic side. No evidence of thrombus.
Normal compressibility.

Common Femoral Vein: No evidence of thrombus. Normal
compressibility, respiratory phasicity and response to augmentation.

Saphenofemoral Junction: No evidence of thrombus. Normal
compressibility and flow on color Doppler imaging.

Profunda Femoral Vein: No evidence of thrombus. Normal
compressibility and flow on color Doppler imaging.

Femoral Vein: No evidence of thrombus. Normal compressibility,
respiratory phasicity and response to augmentation.

Popliteal Vein: No evidence of thrombus. Normal compressibility,
respiratory phasicity and response to augmentation.

Calf Veins: No evidence of thrombus. Normal compressibility and flow
on color Doppler imaging.

Superficial Great Saphenous Vein: No evidence of thrombus. Normal
compressibility and flow on color Doppler imaging.

Venous Reflux:  None.

Other Findings:  None.
IMPRESSION: No evidence of DVT within the left lower extremity.

## 2018-04-19 ENCOUNTER — Ambulatory Visit: Payer: Self-pay

## 2018-05-06 ENCOUNTER — Encounter: Payer: Self-pay | Admitting: Emergency Medicine

## 2018-05-06 ENCOUNTER — Emergency Department
Admission: EM | Admit: 2018-05-06 | Discharge: 2018-05-06 | Disposition: A | Discharge status: Home / Self Care | Payer: Medicaid Other | Attending: Emergency Medicine | Admitting: Emergency Medicine

## 2018-05-06 DIAGNOSIS — Z5321 Procedure and treatment not carried out due to patient leaving prior to being seen by health care provider: Secondary | ICD-10-CM | POA: Insufficient documentation

## 2018-05-06 DIAGNOSIS — R1011 Right upper quadrant pain: Secondary | ICD-10-CM | POA: Insufficient documentation

## 2018-05-06 LAB — CBC
HCT: 42.8 % (ref 36.0–46.0)
HEMOGLOBIN: 14.1 g/dL (ref 12.0–15.0)
MCH: 27.4 pg (ref 26.0–34.0)
MCHC: 32.9 g/dL (ref 30.0–36.0)
MCV: 83.3 fL (ref 80.0–100.0)
Platelets: 245 10*3/uL (ref 150–400)
RBC: 5.14 MIL/uL — AB (ref 3.87–5.11)
RDW: 12.5 % (ref 11.5–15.5)
WBC: 10.5 10*3/uL (ref 4.0–10.5)
nRBC: 0 % (ref 0.0–0.2)

## 2018-05-06 LAB — COMPREHENSIVE METABOLIC PANEL
ALK PHOS: 85 U/L (ref 38–126)
ALT: 15 U/L (ref 0–44)
ANION GAP: 8 (ref 5–15)
AST: 17 U/L (ref 15–41)
Albumin: 4.3 g/dL (ref 3.5–5.0)
BILIRUBIN TOTAL: 0.5 mg/dL (ref 0.3–1.2)
BUN: 20 mg/dL (ref 6–20)
CALCIUM: 9.5 mg/dL (ref 8.9–10.3)
CO2: 27 mmol/L (ref 22–32)
Chloride: 102 mmol/L (ref 98–111)
Creatinine, Ser: 0.55 mg/dL (ref 0.44–1.00)
GFR calc Af Amer: >60 mL/min (ref >60)
Glucose, Bld: 384 mg/dL — ABNORMAL HIGH (ref 70–99)
POTASSIUM: 4.3 mmol/L (ref 3.5–5.1)
Sodium: 137 mmol/L (ref 135–145)
TOTAL PROTEIN: 7.4 g/dL (ref 6.5–8.1)

## 2018-05-06 LAB — URINALYSIS, COMPLETE (UACMP) WITH MICROSCOPIC
BILIRUBIN URINE: NEGATIVE
Hgb urine dipstick: NEGATIVE
KETONES UR: NEGATIVE mg/dL
Nitrite: POSITIVE — AB
PROTEIN: NEGATIVE mg/dL
Specific Gravity, Urine: 1.036 — ABNORMAL HIGH (ref 1.005–1.030)
pH: 5 (ref 5.0–8.0)

## 2018-05-06 LAB — LIPASE, BLOOD: Lipase: 46 U/L (ref 11–51)

## 2018-05-06 NOTE — ED Triage Notes (Signed)
Patient with complaint of right upper abdominal pain times two days. Patient states that she has had nausea and vomited times one.

## 2018-05-08 ENCOUNTER — Encounter: Payer: Self-pay | Admitting: Radiology

## 2018-05-08 ENCOUNTER — Emergency Department
Admission: EM | Admit: 2018-05-08 | Discharge: 2018-05-08 | Disposition: A | Discharge status: Home / Self Care | Payer: Self-pay | Attending: Emergency Medicine | Admitting: Emergency Medicine

## 2018-05-08 ENCOUNTER — Other Ambulatory Visit: Payer: Self-pay

## 2018-05-08 ENCOUNTER — Emergency Department: Payer: Self-pay

## 2018-05-08 DIAGNOSIS — E1165 Type 2 diabetes mellitus with hyperglycemia: Secondary | ICD-10-CM | POA: Insufficient documentation

## 2018-05-08 DIAGNOSIS — F17210 Nicotine dependence, cigarettes, uncomplicated: Secondary | ICD-10-CM | POA: Insufficient documentation

## 2018-05-08 DIAGNOSIS — R739 Hyperglycemia, unspecified: Secondary | ICD-10-CM

## 2018-05-08 DIAGNOSIS — Z794 Long term (current) use of insulin: Secondary | ICD-10-CM | POA: Insufficient documentation

## 2018-05-08 DIAGNOSIS — I10 Essential (primary) hypertension: Secondary | ICD-10-CM | POA: Insufficient documentation

## 2018-05-08 DIAGNOSIS — Z79899 Other long term (current) drug therapy: Secondary | ICD-10-CM | POA: Insufficient documentation

## 2018-05-08 DIAGNOSIS — N12 Tubulo-interstitial nephritis, not specified as acute or chronic: Secondary | ICD-10-CM

## 2018-05-08 DIAGNOSIS — R1011 Right upper quadrant pain: Secondary | ICD-10-CM

## 2018-05-08 HISTORY — DX: Systemic involvement of connective tissue, unspecified: M35.9

## 2018-05-08 LAB — URINALYSIS, COMPLETE (UACMP) WITH MICROSCOPIC
Bacteria, UA: NONE SEEN
Bilirubin Urine: NEGATIVE
Glucose, UA: >=500 mg/dL — AB
Hgb urine dipstick: NEGATIVE
Ketones, ur: NEGATIVE mg/dL
Nitrite: NEGATIVE
PROTEIN: NEGATIVE mg/dL
SPECIFIC GRAVITY, URINE: 1.029 (ref 1.005–1.030)
pH: 5 (ref 5.0–8.0)

## 2018-05-08 LAB — COMPREHENSIVE METABOLIC PANEL
ALT: 14 U/L (ref 0–44)
AST: 18 U/L (ref 15–41)
Albumin: 4.1 g/dL (ref 3.5–5.0)
Alkaline Phosphatase: 84 U/L (ref 38–126)
Anion gap: 10 (ref 5–15)
BUN: 17 mg/dL (ref 6–20)
CHLORIDE: 101 mmol/L (ref 98–111)
CO2: 25 mmol/L (ref 22–32)
CREATININE: 0.65 mg/dL (ref 0.44–1.00)
Calcium: 9.1 mg/dL (ref 8.9–10.3)
GFR calc Af Amer: >60 mL/min (ref >60)
GFR calc non Af Amer: >60 mL/min (ref >60)
GLUCOSE: 565 mg/dL — AB (ref 70–99)
Potassium: 4 mmol/L (ref 3.5–5.1)
Sodium: 136 mmol/L (ref 135–145)
Total Bilirubin: 0.4 mg/dL (ref 0.3–1.2)
Total Protein: 7.2 g/dL (ref 6.5–8.1)

## 2018-05-08 LAB — CBC
HEMATOCRIT: 43.3 % (ref 36.0–46.0)
Hemoglobin: 14.1 g/dL (ref 12.0–15.0)
MCH: 27.6 pg (ref 26.0–34.0)
MCHC: 32.6 g/dL (ref 30.0–36.0)
MCV: 84.9 fL (ref 80.0–100.0)
Platelets: 232 10*3/uL (ref 150–400)
RBC: 5.1 MIL/uL (ref 3.87–5.11)
RDW: 12.3 % (ref 11.5–15.5)
WBC: 10.3 10*3/uL (ref 4.0–10.5)
nRBC: 0 % (ref 0.0–0.2)

## 2018-05-08 LAB — APTT: APTT: 31 s (ref 24–36)

## 2018-05-08 LAB — PROTIME-INR
INR: 0.96
PROTHROMBIN TIME: 12.7 s (ref 11.4–15.2)

## 2018-05-08 LAB — LIPASE, BLOOD: LIPASE: 52 U/L — AB (ref 11–51)

## 2018-05-08 LAB — GLUCOSE, CAPILLARY: GLUCOSE-CAPILLARY: 222 mg/dL — AB (ref 70–99)

## 2018-05-08 MED ORDER — IOPAMIDOL (ISOVUE-300) INJECTION 61%
100.0000 mL | Freq: Once | INTRAVENOUS | Status: AC | PRN
  Administered 2018-05-08: 100 mL via INTRAVENOUS

## 2018-05-08 MED ORDER — INSULIN ASPART 100 UNIT/ML ~~LOC~~ SOLN
10.0000 [IU] | Freq: Once | SUBCUTANEOUS | Status: AC
  Administered 2018-05-08: 10 [IU] via INTRAVENOUS
  Filled 2018-05-08: qty 1

## 2018-05-08 MED ORDER — CIPROFLOXACIN HCL 500 MG PO TABS: 500.0000 mg | ORAL_TABLET | Freq: Once | ORAL | Status: DC

## 2018-05-08 MED ORDER — PROMETHAZINE HCL 25 MG/ML IJ SOLN
12.5000 mg | Freq: Once | INTRAMUSCULAR | Status: AC
  Administered 2018-05-08: 12.5 mg via INTRAVENOUS
  Filled 2018-05-08: qty 1

## 2018-05-08 MED ORDER — ONDANSETRON HCL 4 MG/2ML IJ SOLN
4.0000 mg | Freq: Once | INTRAMUSCULAR | Status: AC
  Administered 2018-05-08: 4 mg via INTRAVENOUS

## 2018-05-08 MED ORDER — CEPHALEXIN 500 MG PO CAPS: 500.0000 mg | ORAL_CAPSULE | Freq: Four times a day (QID) | ORAL | 0 refills | Status: AC

## 2018-05-08 MED ORDER — ONDANSETRON 4 MG PO TBDP: 4.0000 mg | ORAL_TABLET | Freq: Three times a day (TID) | ORAL | 0 refills | Status: AC | PRN

## 2018-05-08 MED ORDER — ACETAMINOPHEN 500 MG PO TABS
1000.0000 mg | ORAL_TABLET | Freq: Once | ORAL | Status: DC
  Filled 2018-05-08: qty 2

## 2018-05-08 MED ORDER — ONDANSETRON HCL 4 MG/2ML IJ SOLN
INTRAMUSCULAR | Status: AC
  Filled 2018-05-08: qty 2

## 2018-05-08 MED ORDER — CIPROFLOXACIN HCL 500 MG PO TABS
ORAL_TABLET | ORAL | Status: AC
  Filled 2018-05-08: qty 1

## 2018-05-08 MED ORDER — KETOROLAC TROMETHAMINE 30 MG/ML IJ SOLN
15.0000 mg | Freq: Once | INTRAMUSCULAR | Status: AC
  Administered 2018-05-08: 15 mg via INTRAVENOUS
  Filled 2018-05-08: qty 1

## 2018-05-08 MED ORDER — CIPROFLOXACIN IN D5W 400 MG/200ML IV SOLN
400.0000 mg | Freq: Once | INTRAVENOUS | Status: AC
  Administered 2018-05-08: 400 mg via INTRAVENOUS

## 2018-05-08 MED ORDER — CIPROFLOXACIN IN D5W 400 MG/200ML IV SOLN
INTRAVENOUS | Status: AC
  Filled 2018-05-08: qty 200

## 2018-05-08 MED ORDER — SODIUM CHLORIDE 0.9 % IV BOLUS
2000.0000 mL | Freq: Once | INTRAVENOUS | Status: AC
  Administered 2018-05-08: 2000 mL via INTRAVENOUS

## 2018-05-08 MED ORDER — MORPHINE SULFATE (PF) 4 MG/ML IV SOLN: 4.0000 mg | Freq: Once | INTRAVENOUS | Status: DC

## 2018-05-08 MED ORDER — ONDANSETRON HCL 4 MG/2ML IJ SOLN
4.0000 mg | INTRAMUSCULAR | Status: AC
  Administered 2018-05-08: 4 mg via INTRAVENOUS
  Filled 2018-05-08: qty 2

## 2018-05-08 MED ORDER — IOHEXOL 300 MG/ML  SOLN: 100.0000 mL | Freq: Once | INTRAMUSCULAR | Status: DC | PRN

## 2018-05-08 NOTE — ED Notes (Signed)
Pt instructed to attempt PO challenge in 30 minutes per Dr. Darnelle Catalan request. Sprite at bedside. Pt states that she does not feel like she can swallow drink.

## 2018-05-08 NOTE — ED Triage Notes (Signed)
Patient reports having abdominal pain for several days.  In ED on Sunday but did not stay.

## 2018-05-08 NOTE — ED Notes (Signed)
Pt instructed that Dr. Darnelle Catalan wanted to trial a PO challenge. Pt refused and stated she was "in too much pain and y'all just don't understand. I guess I'll just go to [place] and get treated". This RN asked is pt would trial po liquids or food and pt stated "No I haven't been able to eat in two days and I'm not going to now". Pt counseled that she could be treated here, but that she was refusing treatment options. This RN asked pt if the pt would like to stay instead and pt stated "no, I'll just go home". MD notified.

## 2018-05-08 NOTE — ED Notes (Signed)
ED Provider at bedside. 

## 2018-05-08 NOTE — ED Provider Notes (Signed)
Patient CT shows wedge-shaped hypodensities in the kidneys which radiology feels is consistent with kidney infection lobar nephronia.  Patient still complains of vague pain over her liver.  She also reports she is been vomiting for 2 days.  We will give her a little bit more Zofran to see if she can tolerate p.o.  If she can tolerate p.o. I think we can try to send her home on some antibiotics with very close follow-up if she is not tolerating p.o. we will have to admit her.   Arnaldo Natal, MD 05/08/18 [phone]

## 2018-05-08 NOTE — Discharge Instructions (Addendum)
The CAT scan looks like you have kidney infections on both sides.  I will give you some Keflex 1 pill 4 times a day to treat the infection.  We will give you some Zofran melt on your tongue wafers for the nausea.  Use Tylenol or Motrin for the pain for now.  Return for worsening fever worsening pain vomiting and unable to keep down the medicine or he feels sicker.

## 2018-05-08 NOTE — ED Notes (Signed)
No emesis witnessed during this RN time of care. Handoff to [place], [place]

## 2018-05-08 NOTE — ED Provider Notes (Signed)
[facility] Emergency Department Provider Note  ____________________________________________   First MD Initiated Contact with Patient 05/08/18 [phone]     (approximate)  I have reviewed the triage vital signs and the nursing notes.   HISTORY  Chief Complaint Abdominal Pain    HPI Karen Lindsey is a 52 y.o. female with numerous prior emergency department visits (8 in the last 6 months just within the [facility] health system) and a history of chronic pain as well as narcotic addiction.  She presents by private vehicle for evaluation of about 3 days of right upper quadrant pain with nausea.  She reports the pain is severe and constant.  Eating makes it worse and nothing in particular makes it better.  She says that she feels bloated.  She has no lower abdominal pain.  She reports that she has had pancreatitis in the past even though she is not a drinker and that she had a cholecystectomy about 9 years ago.  She denies fever/chills, chest pain, shortness of breath, vomiting, and dysuria.  She has had multiple loose stools.  She reports being compliant with her medications including her insulin and thus was surprised to find out that her blood sugar was greater than 500.  Past Medical History:  Diagnosis Date  . Arthritis    rheumatoid  . Chronic pain    a. upper/mid back.  . Collagen vascular disease (HCC)    RA  . Diabetic peripheral neuropathy (HCC)   . Fatty liver   . Hypertension   . Insulin dependent diabetes mellitus (HCC)    a. Dx ~ 2011.  . Narcotic addiction (HCC)    a. Followed in substance abuse center in [place] - on chronic methadone.  . Neuropathy   . RA (rheumatoid arthritis) (HCC)   . Tobacco abuse     Patient Active Problem List   Diagnosis Date Noted  . Tobacco use disorder 03/02/2018  . Severe recurrent major depression without psychotic features (HCC) 02/27/2018  . Suicidal ideation 02/27/2018  . Diabetes (HCC) 02/27/2018  . Chronic pain  02/27/2018  . Narcotic abuse (HCC) 01/24/2018  . Acute delirium 01/24/2018  . DKA (diabetic ketoacidoses) (HCC) 01/23/2018  . AKI (acute kidney injury) (HCC) 01/23/2018  . HTN (hypertension) 01/23/2018  . Chest pain 11/08/2017    Past Surgical History:  Procedure Laterality Date  . ABDOMINAL HYSTERECTOMY    . CESAREAN SECTION    . CHOLECYSTECTOMY    . KNEE SURGERY    . MANDIBLE FRACTURE SURGERY      Prior to Admission medications   Medication Sig Start Date End Date Taking? Authorizing Provider  amLODipine (NORVASC) 2.5 MG tablet Take 1 tablet (2.5 mg total) by mouth daily. 03/07/18   McNew, Ileene Hutchinson, MD  canagliflozin (INVOKANA) 100 MG TABS tablet Take by mouth daily before breakfast.    [provider]  cyclobenzaprine (FLEXERIL) 5 MG tablet Take 1 tablet (5 mg total) by mouth every 8 (eight) hours as needed for muscle spasms. 03/07/18   McNew, Ileene Hutchinson, MD  DULoxetine (CYMBALTA) 60 MG capsule Take 1 capsule (60 mg total) by mouth daily. 03/07/18   McNew, Ileene Hutchinson, MD  gabapentin (NEURONTIN) 400 MG capsule Take 3 capsules (1,200 mg total) by mouth 3 (three) times daily. 03/07/18 04/06/18  McNew, Ileene Hutchinson, MD  hydrOXYzine (ATARAX/VISTARIL) 50 MG tablet Take 1 tablet (50 mg total) by mouth 3 (three) times daily as needed for anxiety. 03/07/18   McNew, Ileene Hutchinson, MD  insulin aspart protamine- aspart (NOVOLOG MIX 70/30) (70-30) 100 UNIT/ML injection Inject 0.42 mLs (42 Units total) into the skin 2 (two) times daily with a meal. 03/06/18   McNew, Ileene Hutchinson, MD  ketorolac (TORADOL) 10 MG tablet Take 1 tablet (10 mg total) by mouth every 6 (six) hours as needed for severe pain. 03/07/18   McNew, Ileene Hutchinson, MD  lidocaine (LIDODERM) 5 % Place 1 patch onto the skin daily. Remove & Discard patch within 12 hours or as directed by MD 03/07/18   McNew, Ileene Hutchinson, MD  lisinopril (PRINIVIL,ZESTRIL) 20 MG tablet Take 1 tablet (20 mg total) by mouth daily. 03/07/18   McNew, Ileene Hutchinson, MD  traZODone (DESYREL) 150 MG  tablet Take 1 tablet (150 mg total) by mouth at bedtime as needed for sleep. 03/07/18   McNew, Ileene Hutchinson, MD    Allergies Ultram Marcia Brash hcl]  No family history on file.  Social History Social History   Tobacco Use  . Smoking status: Current Every Day Smoker    Packs/day: 0.50    Types: Cigarettes  . Smokeless tobacco: Never Used  . Tobacco comment: 30+ years  Substance Use Topics  . Alcohol use: No    Comment: Hasn't had a drink since 1998  . Drug use: Not Currently    Comment: Previously abused pain pills. prescribed methadone     Review of Systems Constitutional: No fever/chills Eyes: No visual changes. ENT: No sore throat. Cardiovascular: Denies chest pain. Respiratory: Denies shortness of breath. Gastrointestinal: Abdominal pain and nausea as described above.  Some loose stools.  No vomiting. Genitourinary: Negative for dysuria. Musculoskeletal: Negative for neck pain.  Negative for back pain. Integumentary: Negative for rash. Neurological: Negative for headaches, focal weakness or numbness.   ____________________________________________   PHYSICAL EXAM:  VITAL SIGNS: ED Triage Vitals  Enc Vitals Group     BP 05/08/18 0426 (!) 160/74     Pulse Rate 05/08/18 0426 73     Resp 05/08/18 0426 20     Temp 05/08/18 0426 97.8 F (36.6 C)     Temp Source 05/08/18 0426 Oral     SpO2 05/08/18 0426 100 %     Weight 05/08/18 0427 57.6 kg (127 lb)     Height 05/08/18 0427 1.549 m (5\' 1" )     Head Circumference --      Peak Flow --      Pain Score 05/08/18 0427 8     Pain Loc --      Pain Edu? --      Excl. in GC? --     Constitutional: Alert and oriented.  Appears uncomfortable but is not in severe distress. Eyes: Conjunctivae are normal.  Head: Atraumatic. Nose: No congestion/rhinnorhea. Mouth/Throat: Mucous membranes are moist. Neck: No stridor.  No meningeal signs.   Cardiovascular: Normal rate, regular rhythm. Good peripheral circulation. Grossly normal  heart sounds. Respiratory: Normal respiratory effort.  No retractions. Lungs CTAB. Gastrointestinal: Soft and nondistended.  Tender to palpation in the right side of the abdomen including the right upper quadrant.  No specific epigastric tenderness although the patient does have mild diffuse tenderness throughout the abdomen.  No rebound and no guarding.  No right lower quadrant tenderness to palpation specifically.. Musculoskeletal: No lower extremity tenderness nor edema. No gross deformities of extremities. Neurologic:  Normal speech and language. No gross focal neurologic deficits are appreciated.  Skin:  Skin is warm, dry and intact. No rash noted. Psychiatric: Mood and affect are normal.  Speech and behavior are normal.  ____________________________________________   LABS (all labs ordered are listed, but only abnormal results are displayed)  Labs Reviewed  LIPASE, BLOOD - Abnormal; Notable for the following components:      Result Value   Lipase 52 (*)    All other components within normal limits  COMPREHENSIVE METABOLIC PANEL - Abnormal; Notable for the following components:   Glucose, Bld 565 (*)    All other components within normal limits  URINALYSIS, COMPLETE (UACMP) WITH MICROSCOPIC - Abnormal; Notable for the following components:   Color, Urine STRAW (*)    APPearance CLEAR (*)    Glucose, UA >=500 (*)    Leukocytes, UA SMALL (*)    All other components within normal limits  URINE CULTURE  CBC  PROTIME-INR  APTT   ____________________________________________  EKG  None - EKG not ordered by ED physician ____________________________________________  RADIOLOGY   ED MD interpretation:  CT scan pending  Official radiology report(s): No results found.  ____________________________________________   PROCEDURES  Critical Care performed: No   Procedure(s) performed:   Procedures   ____________________________________________   INITIAL IMPRESSION /  ASSESSMENT AND PLAN / ED COURSE  As part of my medical decision making, I reviewed the following data within the electronic MEDICAL RECORD NUMBER Nursing notes reviewed and incorporated, Labs reviewed , Old chart reviewed, Patient signed out to Dr. Darnelle Catalan and reviewed Notes from prior ED visits    Differential diagnosis includes, but is not limited to, pancreatitis, retained gallstone leading to choledocholithiasis, musculoskeletal pain, acute hepatitis, acute on chronic pain syndrome, SBO/ileus, less likely renal colic or renal ischemia.  The patient seems uncomfortable with tenderness in the anterior abdomen, primarily the right upper quadrant.  Her vital signs are stable with no tachycardia no fever.  Lab work is notable for having no leukocytosis, a normal comprehensive metabolic panel except for a glucose of 565 (LFTs are normal), and a very slightly elevated lipase at 52 (the upper limit of normal is 51).  She has not yet provided a urine sample.  There is no sign of acute infection at this time and it is reassuring that her LFTs are within normal limits.  Her lipase is very slightly elevated at 52 which is only 1 unit over the upper limit of normal and I do not think this is likely to represent an acute pancreatitis.  However she is reporting a great deal of tenderness, she has uncontrolled hyperglycemia which could be secondary to infection, and she is diabetic which bears a bit closer consideration and evaluation.  I will obtain a CT scan of the abdomen and pelvis with IV contrast.  Given her history of narcotic abuse, I am uncomfortable giving narcotics at this time for what is overall relatively reassuring work-up thus far.  I will provide Toradol 15 mg IV and Zofran 4 mg IV.  I am giving 2 L of normal saline not only for hydration but to begin to dilute out her hyperglycemia and I am also giving regular insulin 10 units IV.  Clinical Course as of May 08 709  Tue May 08, 2018  0709 Coags are  within normal limits.  Transferring emergency department care to Dr. Darnelle Catalan to follow-up on the CT scan results and reassess.  I anticipate discharge and outpatient follow-up unless an acute or emergent abnormality is identified on the CT scan.   [CF]    Clinical Course User Index [CF] Loleta Rose, MD    ____________________________________________  FINAL CLINICAL IMPRESSION(S) / ED DIAGNOSES  Final diagnoses:  Hyperglycemia  RUQ abdominal pain     MEDICATIONS GIVEN DURING THIS VISIT:  Medications  iohexol (OMNIPAQUE) 300 MG/ML solution 100 mL (has no administration in time range)  ondansetron (ZOFRAN) injection 4 mg (4 mg Intravenous Given 05/08/18 0622)  sodium chloride 0.9 % bolus 2,000 mL (0 mLs Intravenous Stopped 05/08/18 0628)  insulin aspart (novoLOG) injection 10 Units (10 Units Intravenous Given 05/08/18 0626)  ketorolac (TORADOL) 30 MG/ML injection 15 mg (15 mg Intravenous Given 05/08/18 0622)  iopamidol (ISOVUE-300) 61 % injection 100 mL (100 mLs Intravenous Contrast Given 05/08/18 1610)     ED Discharge Orders    None       Note:  This document was prepared using Dragon voice recognition software and may include unintentional dictation errors.    Loleta Rose, MD 05/08/18 [phone]

## 2018-05-08 NOTE — ED Notes (Signed)
Pt refusing to drink or attempt to drink at this time.

## 2018-05-08 NOTE — ED Provider Notes (Signed)
Patient is not wanting to take anything by mouth.  She says she is nauseated.  She will not try anything by mouth.  She seems to want some pain medicine.  She is however on methadone for opioid dependence.  I do not want to give her opioids for what does not appear to be severe pain.  She did not have any relief with the Toradol.  We will try some p.o. Tylenol.  ----------------------------------------- 11:37 AM on 05/08/2018 -----------------------------------------  Patient refuses to take p.o. medication she wants to go home.  Since she is refusing to take medications but not vomiting anymore we will let her go.  We will give her a prescription for Keflex 500 mg 4 times a day Zofran ODT and have her come back for worse pain fever vomiting or feeling sicker.   Arnaldo Natal, MD 05/08/18 1137

## 2018-05-08 NOTE — ED Triage Notes (Signed)
Critical glucose 565

## 2018-05-08 NOTE — ED Notes (Signed)
This RN attempted to give pt tylenol. Pt stated "You don't understand, I am in a lot of pain. I am not taking this because I am afraid I am going to throw up. Just print my discharge papers". This RN explained that this was the medication that the doctor had ordered and that the doctor wanted the pt to try to take oral medications. Pt responded "I'm not taking it". MD notified.

## 2018-05-08 NOTE — ED Notes (Signed)
EDP informed of patient pain and request for pain medication. Informed that patient states nausea is only slightly better and informed that patient has not attempted to drink at this time. Explained to patient importance of antibiotic administration and that PO challenge would be beneficial to determining patient's disposition and well being.

## 2018-05-08 NOTE — ED Notes (Signed)
Pt will attempt to drink sprite and crackers.

## 2018-05-08 NOTE — ED Notes (Signed)
Pt will not attempt to drink at this time. EDP informed

## 2018-05-10 LAB — URINE CULTURE
Culture: >=100000 — AB
SPECIAL REQUESTS: NORMAL

## 2018-06-25 ENCOUNTER — Emergency Department: Payer: Self-pay

## 2018-06-25 ENCOUNTER — Other Ambulatory Visit: Payer: Self-pay

## 2018-06-25 ENCOUNTER — Emergency Department
Admission: EM | Admit: 2018-06-25 | Discharge: 2018-06-25 | Disposition: A | Discharge status: Home / Self Care | Payer: Self-pay | Attending: Emergency Medicine | Admitting: Emergency Medicine

## 2018-06-25 ENCOUNTER — Encounter: Payer: Self-pay | Admitting: Emergency Medicine

## 2018-06-25 DIAGNOSIS — Y999 Unspecified external cause status: Secondary | ICD-10-CM | POA: Insufficient documentation

## 2018-06-25 DIAGNOSIS — M25561 Pain in right knee: Secondary | ICD-10-CM | POA: Insufficient documentation

## 2018-06-25 DIAGNOSIS — E119 Type 2 diabetes mellitus without complications: Secondary | ICD-10-CM | POA: Insufficient documentation

## 2018-06-25 DIAGNOSIS — Y939 Activity, unspecified: Secondary | ICD-10-CM | POA: Insufficient documentation

## 2018-06-25 DIAGNOSIS — Z794 Long term (current) use of insulin: Secondary | ICD-10-CM | POA: Insufficient documentation

## 2018-06-25 DIAGNOSIS — Z79899 Other long term (current) drug therapy: Secondary | ICD-10-CM | POA: Insufficient documentation

## 2018-06-25 DIAGNOSIS — S93401A Sprain of unspecified ligament of right ankle, initial encounter: Secondary | ICD-10-CM | POA: Insufficient documentation

## 2018-06-25 DIAGNOSIS — F17210 Nicotine dependence, cigarettes, uncomplicated: Secondary | ICD-10-CM | POA: Insufficient documentation

## 2018-06-25 DIAGNOSIS — Y92009 Unspecified place in unspecified non-institutional (private) residence as the place of occurrence of the external cause: Secondary | ICD-10-CM | POA: Insufficient documentation

## 2018-06-25 DIAGNOSIS — W109XXA Fall (on) (from) unspecified stairs and steps, initial encounter: Secondary | ICD-10-CM | POA: Insufficient documentation

## 2018-06-25 DIAGNOSIS — I10 Essential (primary) hypertension: Secondary | ICD-10-CM | POA: Insufficient documentation

## 2018-06-25 MED ORDER — AMLODIPINE BESYLATE 2.5 MG PO TABS: 2.5000 mg | ORAL_TABLET | Freq: Every day | ORAL | 1 refills | Status: AC

## 2018-06-25 MED ORDER — GABAPENTIN 400 MG PO CAPS: 1200.0000 mg | ORAL_CAPSULE | Freq: Three times a day (TID) | ORAL | 1 refills | Status: AC

## 2018-06-25 MED ORDER — HYDROCODONE-ACETAMINOPHEN 5-325 MG PO TABS
1.0000 | ORAL_TABLET | Freq: Once | ORAL | Status: AC
  Administered 2018-06-25: 1 via ORAL
  Filled 2018-06-25: qty 1

## 2018-06-25 MED ORDER — CYCLOBENZAPRINE HCL 5 MG PO TABS: 5.0000 mg | ORAL_TABLET | Freq: Three times a day (TID) | ORAL | 0 refills | Status: AC | PRN

## 2018-06-25 MED ORDER — LISINOPRIL 20 MG PO TABS: 20.0000 mg | ORAL_TABLET | Freq: Every day | ORAL | 1 refills | Status: AC

## 2018-06-25 MED ORDER — INSULIN ASPART PROT & ASPART (70-30 MIX) 100 UNIT/ML ~~LOC~~ SUSP: 42.0000 [IU] | Freq: Two times a day (BID) | SUBCUTANEOUS | 1 refills | Status: AC

## 2018-06-25 NOTE — ED Triage Notes (Signed)
Says she fell down steps at home this am.  Tripped on her shoe.  Says pain knee and ankle right.

## 2018-06-25 NOTE — ED Notes (Signed)
See triage note  Presents s/p fall   having pain to right ankle and knee s/p fall

## 2018-06-25 NOTE — ED Notes (Signed)
Patient understands to follow-up with [facility] and medication management.

## 2018-06-25 NOTE — Discharge Instructions (Signed)
Your exam and x-rays are negative for fracture or dislocation. Wear the ace bandages for support. Rest, ice, and elevate when seated. Take the prescription meds as directed. Follow-up with Dr. Allena Katz, for ongoing symptoms.

## 2018-06-25 NOTE — ED Provider Notes (Signed)
[facility] Emergency Department Provider Note ____________________________________________  Time seen: 1024  I have reviewed the triage vital signs and the nursing notes.  HISTORY  Chief Complaint  Fall; Ankle Pain; and Knee Pain  HPI Karen Lindsey is a 52 y.o. female who presents to the ED for evaluation of injury sustained following a mechanical fall.  She describes slipping down some steps at home this morning.  She tripped to her shoe, and describes pain to the right knee and the right ankle.  She presents now for further evaluation of her injury.  She denies any head injury, loss of consciousness, nausea, vomiting, or dizziness.  She gives a remote history of a previous right knee ACL tear with subsequent repair.  Past Medical History:  Diagnosis Date  . Arthritis    rheumatoid  . Chronic pain    a. upper/mid back.  . Collagen vascular disease (HCC)    RA  . Diabetic peripheral neuropathy (HCC)   . Fatty liver   . Hypertension   . Insulin dependent diabetes mellitus (HCC)    a. Dx ~ 2011.  . Narcotic addiction (HCC)    a. Followed in substance abuse center in [place] - on chronic methadone.  . Neuropathy   . RA (rheumatoid arthritis) (HCC)   . Tobacco abuse     Patient Active Problem List   Diagnosis Date Noted  . Tobacco use disorder 03/02/2018  . Severe recurrent major depression without psychotic features (HCC) 02/27/2018  . Suicidal ideation 02/27/2018  . Diabetes (HCC) 02/27/2018  . Chronic pain 02/27/2018  . Narcotic abuse (HCC) 01/24/2018  . Acute delirium 01/24/2018  . DKA (diabetic ketoacidoses) (HCC) 01/23/2018  . AKI (acute kidney injury) (HCC) 01/23/2018  . HTN (hypertension) 01/23/2018  . Chest pain 11/08/2017    Past Surgical History:  Procedure Laterality Date  . ABDOMINAL HYSTERECTOMY    . CESAREAN SECTION    . CHOLECYSTECTOMY    . KNEE SURGERY    . MANDIBLE FRACTURE SURGERY      Prior to Admission medications    Medication Sig Start Date End Date Taking? Authorizing Provider  amLODipine (NORVASC) 2.5 MG tablet Take 1 tablet (2.5 mg total) by mouth daily. Patient not taking: Reported on 05/08/2018 03/07/18   Haskell Riling, MD  amLODipine (NORVASC) 2.5 MG tablet Take 1 tablet (2.5 mg total) by mouth daily. 06/25/18 08/24/18  Dashaun Onstott, Charlesetta Ivory, PA-C  canagliflozin (INVOKANA) 100 MG TABS tablet Take 100 mg by mouth daily before breakfast.     [provider]  cyclobenzaprine (FLEXERIL) 5 MG tablet Take 1 tablet (5 mg total) by mouth every 8 (eight) hours as needed for muscle spasms. Patient not taking: Reported on 05/08/2018 03/07/18   Haskell Riling, MD  cyclobenzaprine (FLEXERIL) 5 MG tablet Take 1 tablet (5 mg total) by mouth 3 (three) times daily as needed for muscle spasms. 06/25/18   Shaylin Blatt, Charlesetta Ivory, PA-C  DULoxetine (CYMBALTA) 60 MG capsule Take 1 capsule (60 mg total) by mouth daily. Patient not taking: Reported on 05/08/2018 03/07/18   Haskell Riling, MD  gabapentin (NEURONTIN) 400 MG capsule Take 3 capsules (1,200 mg total) by mouth 3 (three) times daily. 03/07/18 04/06/18  McNew, Ileene Hutchinson, MD  gabapentin (NEURONTIN) 400 MG capsule Take 1,200 mg by mouth 3 (three) times daily.    [provider]  gabapentin (NEURONTIN) 400 MG capsule Take 3 capsules (1,200 mg total) by mouth 3 (three) times daily. 06/25/18  08/24/18  Deondray Ospina, Charlesetta Ivory, PA-C  hydrOXYzine (ATARAX/VISTARIL) 50 MG tablet Take 1 tablet (50 mg total) by mouth 3 (three) times daily as needed for anxiety. Patient not taking: Reported on 05/08/2018 03/07/18   Haskell Riling, MD  ibuprofen (ADVIL,MOTRIN) 200 MG tablet Take 200 mg by mouth every 6 (six) hours as needed.    [provider]  insulin aspart protamine- aspart (NOVOLOG MIX 70/30) (70-30) 100 UNIT/ML injection Inject 0.42 mLs (42 Units total) into the skin 2 (two) times daily with a meal. 03/06/18   McNew, Ileene Hutchinson, MD  insulin aspart protamine-  aspart (NOVOLOG MIX 70/30) (70-30) 100 UNIT/ML injection Inject 0.42 mLs (42 Units total) into the skin 2 (two) times daily with a meal. 06/25/18 09/05/18  Bettyanne Dittman, Charlesetta Ivory, PA-C  lisinopril (PRINIVIL,ZESTRIL) 20 MG tablet Take 1 tablet (20 mg total) by mouth daily. 03/07/18   McNew, Ileene Hutchinson, MD  lisinopril (PRINIVIL,ZESTRIL) 20 MG tablet Take 1 tablet (20 mg total) by mouth daily. 06/25/18 08/24/18  Mionna Advincula, Charlesetta Ivory, PA-C  ondansetron (ZOFRAN ODT) 4 MG disintegrating tablet Take 1 tablet (4 mg total) by mouth every 8 (eight) hours as needed for nausea or vomiting. 05/08/18   Arnaldo Natal, MD  traZODone (DESYREL) 150 MG tablet Take 1 tablet (150 mg total) by mouth at bedtime as needed for sleep. Patient not taking: Reported on 05/08/2018 03/07/18   Haskell Riling, MD    Allergies Ultram Marcia Brash hcl]  No family history on file.  Social History Social History   Tobacco Use  . Smoking status: Current Every Day Smoker    Packs/day: 0.50    Types: Cigarettes  . Smokeless tobacco: Never Used  . Tobacco comment: 30+ years  Substance Use Topics  . Alcohol use: No    Comment: Hasn't had a drink since 1998  . Drug use: Not Currently    Comment: Previously abused pain pills. prescribed methadone     Review of Systems  Constitutional: Negative for fever. Eyes: Negative for visual changes. ENT: Negative for sore throat. Cardiovascular: Negative for chest pain. Respiratory: Negative for shortness of breath. Gastrointestinal: Negative for abdominal pain, vomiting and diarrhea. Genitourinary: Negative for dysuria. Musculoskeletal: Negative for back pain. Right knee and ankle pain as above Skin: Negative for rash. Neurological: Negative for headaches, focal weakness or numbness. ____________________________________________  PHYSICAL EXAM:  VITAL SIGNS: ED Triage Vitals [06/25/18 0933]  Enc Vitals Group     BP      Pulse      Resp      Temp      Temp src      SpO2       Weight 122 lb (55.3 kg)     Height 5\' 1"  (1.549 m)     Head Circumference      Peak Flow      Pain Score 6     Pain Loc      Pain Edu?      Excl. in GC?     Constitutional: Alert and oriented. Well appearing and in no distress. Head: Normocephalic and atraumatic. Eyes: Conjunctivae are normal. Normal extraocular movements Cardiovascular: Normal rate, regular rhythm. Normal distal pulses. Respiratory: Normal respiratory effort. No wheezes/rales/rhonchi. Gastrointestinal: Soft and nontender. No distention. Musculoskeletal: Right knee without obvious deformity, effusion, or dislocation. Normal ROM without crepitus. No joint laxity. No valgus or varus joint stress. No popliteal space fullness. Negative anterior/posterior drawer. No significant calf or achilles tenderness. Ankle exam  is benign. No joint laxity. Negative anterior/posterior drawer. No ankle joint effusion, ecchymosis, edema, or deformityNontender with normal range of motion in all extremities.  Neurologic:  Normal gait without ataxia. Normal speech and language. No gross focal neurologic deficits are appreciated. Skin:  Skin is warm, dry and intact. No rash noted. ____________________________________________   RADIOLOGY  Right Knee IMPRESSION: 1. No acute findings. 2. Mild tricompartmental degenerative change of the knee.  Right Ankle IMPRESSION: No acute findings. ____________________________________________  PROCEDURES  Procedures Norco 5-325 mg PO Jones-Watson wrap - right knee Ace bandage - right ankle ____________________________________________  INITIAL IMPRESSION / ASSESSMENT AND PLAN / ED COURSE  Patient with ED evaluation of mechanical fall at home.  Patient is reassured by her exam findings and her negative ankle and knee x-rays.  She splinted to the knee and the ankle for sprain, and is provided with a prescription for cyclobenzaprine.  The patient has been without her routine maintenance  medications for about a month, and as a courtesy refills were provided for her amlodipine, gabapentin, insulin, and lisinopril.  She will follow with her primary provider or return to the ED as needed. ____________________________________________  FINAL CLINICAL IMPRESSION(S) / ED DIAGNOSES  Final diagnoses:  Sprain of right ankle, unspecified ligament, initial encounter  Acute pain of right knee      Lissa Hoard, PA-C 06/25/18 1801    Nita Sickle, MD 06/28/18 1458

## 2018-07-07 ENCOUNTER — Emergency Department
Admission: EM | Admit: 2018-07-07 | Discharge: 2018-07-07 | Disposition: A | Discharge status: Home / Self Care | Payer: Medicaid Other | Attending: Emergency Medicine | Admitting: Emergency Medicine

## 2018-07-07 ENCOUNTER — Other Ambulatory Visit: Payer: Self-pay

## 2018-07-07 DIAGNOSIS — E1165 Type 2 diabetes mellitus with hyperglycemia: Secondary | ICD-10-CM | POA: Insufficient documentation

## 2018-07-07 DIAGNOSIS — F17210 Nicotine dependence, cigarettes, uncomplicated: Secondary | ICD-10-CM | POA: Insufficient documentation

## 2018-07-07 DIAGNOSIS — Z794 Long term (current) use of insulin: Secondary | ICD-10-CM | POA: Insufficient documentation

## 2018-07-07 DIAGNOSIS — M069 Rheumatoid arthritis, unspecified: Secondary | ICD-10-CM | POA: Insufficient documentation

## 2018-07-07 DIAGNOSIS — N390 Urinary tract infection, site not specified: Secondary | ICD-10-CM | POA: Insufficient documentation

## 2018-07-07 DIAGNOSIS — E1142 Type 2 diabetes mellitus with diabetic polyneuropathy: Secondary | ICD-10-CM | POA: Insufficient documentation

## 2018-07-07 DIAGNOSIS — R739 Hyperglycemia, unspecified: Secondary | ICD-10-CM

## 2018-07-07 DIAGNOSIS — Z79899 Other long term (current) drug therapy: Secondary | ICD-10-CM | POA: Insufficient documentation

## 2018-07-07 DIAGNOSIS — I10 Essential (primary) hypertension: Secondary | ICD-10-CM | POA: Insufficient documentation

## 2018-07-07 LAB — CBC
HCT: 36 % (ref 36.0–46.0)
Hemoglobin: 12.1 g/dL (ref 12.0–15.0)
MCH: 27.8 pg (ref 26.0–34.0)
MCHC: 33.6 g/dL (ref 30.0–36.0)
MCV: 82.6 fL (ref 80.0–100.0)
Platelets: 239 10*3/uL (ref 150–400)
RBC: 4.36 MIL/uL (ref 3.87–5.11)
RDW: 13.2 % (ref 11.5–15.5)
WBC: 9.6 10*3/uL (ref 4.0–10.5)
nRBC: 0 % (ref 0.0–0.2)

## 2018-07-07 LAB — URINALYSIS, COMPLETE (UACMP) WITH MICROSCOPIC
Bilirubin Urine: NEGATIVE
Glucose, UA: >=500 mg/dL — AB
HGB URINE DIPSTICK: NEGATIVE
Ketones, ur: 5 mg/dL — AB
Nitrite: POSITIVE — AB
Protein, ur: NEGATIVE mg/dL
Specific Gravity, Urine: 1.025 (ref 1.005–1.030)
pH: 6 (ref 5.0–8.0)

## 2018-07-07 LAB — COMPREHENSIVE METABOLIC PANEL
ALBUMIN: 3.3 g/dL — AB (ref 3.5–5.0)
ALT: 21 U/L (ref 0–44)
ANION GAP: 10 (ref 5–15)
AST: 17 U/L (ref 15–41)
Alkaline Phosphatase: 76 U/L (ref 38–126)
BUN: 33 mg/dL — ABNORMAL HIGH (ref 6–20)
CO2: 25 mmol/L (ref 22–32)
Calcium: 8.8 mg/dL — ABNORMAL LOW (ref 8.9–10.3)
Chloride: 88 mmol/L — ABNORMAL LOW (ref 98–111)
Creatinine, Ser: 0.7 mg/dL (ref 0.44–1.00)
GFR calc Af Amer: >60 mL/min (ref >60)
GFR calc non Af Amer: >60 mL/min (ref >60)
GLUCOSE: 648 mg/dL — AB (ref 70–99)
Potassium: 4 mmol/L (ref 3.5–5.1)
Sodium: 123 mmol/L — ABNORMAL LOW (ref 135–145)
Total Bilirubin: 0.6 mg/dL (ref 0.3–1.2)
Total Protein: 6.4 g/dL — ABNORMAL LOW (ref 6.5–8.1)

## 2018-07-07 LAB — LIPASE, BLOOD: Lipase: 29 U/L (ref 11–51)

## 2018-07-07 LAB — GLUCOSE, CAPILLARY
Glucose-Capillary: 358 mg/dL — ABNORMAL HIGH (ref 70–99)
Glucose-Capillary: 316 mg/dL — ABNORMAL HIGH (ref 70–99)

## 2018-07-07 MED ORDER — ONDANSETRON HCL 4 MG/2ML IJ SOLN
4.0000 mg | Freq: Once | INTRAMUSCULAR | Status: AC
  Administered 2018-07-07: 4 mg via INTRAVENOUS
  Filled 2018-07-07: qty 2

## 2018-07-07 MED ORDER — SODIUM CHLORIDE 0.9 % IV BOLUS: 1000.0000 mL | Freq: Once | INTRAVENOUS | Status: DC

## 2018-07-07 MED ORDER — CEPHALEXIN 500 MG PO CAPS: 500.0000 mg | ORAL_CAPSULE | Freq: Three times a day (TID) | ORAL | 0 refills | Status: AC

## 2018-07-07 MED ORDER — SODIUM CHLORIDE 0.9 % IV SOLN
1.0000 g | Freq: Once | INTRAVENOUS | Status: AC
  Administered 2018-07-07: 1 g via INTRAVENOUS
  Filled 2018-07-07: qty 10

## 2018-07-07 MED ORDER — INSULIN ASPART 100 UNIT/ML ~~LOC~~ SOLN
10.0000 [IU] | Freq: Once | SUBCUTANEOUS | Status: AC
  Administered 2018-07-07: 10 [IU] via INTRAVENOUS
  Filled 2018-07-07: qty 1

## 2018-07-07 NOTE — ED Notes (Signed)
Lights off and pt resting comfortably.

## 2018-07-07 NOTE — ED Provider Notes (Signed)
[facility] Emergency Department Provider Note   ____________________________________________   I have reviewed the triage vital signs and the nursing notes.   HISTORY  Chief Complaint Nausea and Emesis   History limited by: Not Limited   HPI Karen Lindsey is a 52 y.o. female who presents to the emergency department today because of concerns that she might have the flu.  She states she has been feeling bad for about a week.  She did think she was starting to get better but then got worse again today.  Her symptoms have included some fevers, nausea, vomiting and body aches.  She is also noticed some painful urination.  She states she has had kidney infections in the past.  She has not taken her insulin for the past week.   Per medical record review patient has a history of DM  Past Medical History:  Diagnosis Date  . Arthritis    rheumatoid  . Chronic pain    a. upper/mid back.  . Collagen vascular disease (HCC)    RA  . Diabetic peripheral neuropathy (HCC)   . Fatty liver   . Hypertension   . Insulin dependent diabetes mellitus (HCC)    a. Dx ~ 2011.  . Narcotic addiction (HCC)    a. Followed in substance abuse center in [place] - on chronic methadone.  . Neuropathy   . RA (rheumatoid arthritis) (HCC)   . Tobacco abuse     Patient Active Problem List   Diagnosis Date Noted  . Tobacco use disorder 03/02/2018  . Severe recurrent major depression without psychotic features (HCC) 02/27/2018  . Suicidal ideation 02/27/2018  . Diabetes (HCC) 02/27/2018  . Chronic pain 02/27/2018  . Narcotic abuse (HCC) 01/24/2018  . Acute delirium 01/24/2018  . DKA (diabetic ketoacidoses) (HCC) 01/23/2018  . AKI (acute kidney injury) (HCC) 01/23/2018  . HTN (hypertension) 01/23/2018  . Chest pain 11/08/2017    Past Surgical History:  Procedure Laterality Date  . ABDOMINAL HYSTERECTOMY    . CESAREAN SECTION    . CHOLECYSTECTOMY    . KNEE SURGERY    .  MANDIBLE FRACTURE SURGERY      Prior to Admission medications   Medication Sig Start Date End Date Taking? Authorizing Provider  amLODipine (NORVASC) 2.5 MG tablet Take 1 tablet (2.5 mg total) by mouth daily. Patient not taking: Reported on 05/08/2018 03/07/18   Haskell Riling, MD  amLODipine (NORVASC) 2.5 MG tablet Take 1 tablet (2.5 mg total) by mouth daily. 06/25/18 08/24/18  Menshew, Charlesetta Ivory, PA-C  canagliflozin (INVOKANA) 100 MG TABS tablet Take 100 mg by mouth daily before breakfast.     [provider]  cyclobenzaprine (FLEXERIL) 5 MG tablet Take 1 tablet (5 mg total) by mouth every 8 (eight) hours as needed for muscle spasms. Patient not taking: Reported on 05/08/2018 03/07/18   Haskell Riling, MD  cyclobenzaprine (FLEXERIL) 5 MG tablet Take 1 tablet (5 mg total) by mouth 3 (three) times daily as needed for muscle spasms. 06/25/18   Menshew, Charlesetta Ivory, PA-C  DULoxetine (CYMBALTA) 60 MG capsule Take 1 capsule (60 mg total) by mouth daily. Patient not taking: Reported on 05/08/2018 03/07/18   Haskell Riling, MD  gabapentin (NEURONTIN) 400 MG capsule Take 3 capsules (1,200 mg total) by mouth 3 (three) times daily. 03/07/18 04/06/18  McNew, Ileene Hutchinson, MD  gabapentin (NEURONTIN) 400 MG capsule Take 1,200 mg by mouth 3 (three) times daily.    [provider]  gabapentin (NEURONTIN) 400 MG capsule Take 3 capsules (1,200 mg total) by mouth 3 (three) times daily. 06/25/18 08/24/18  Menshew, Charlesetta Ivory, PA-C  hydrOXYzine (ATARAX/VISTARIL) 50 MG tablet Take 1 tablet (50 mg total) by mouth 3 (three) times daily as needed for anxiety. Patient not taking: Reported on 05/08/2018 03/07/18   Haskell Riling, MD  ibuprofen (ADVIL,MOTRIN) 200 MG tablet Take 200 mg by mouth every 6 (six) hours as needed.    [provider]  insulin aspart protamine- aspart (NOVOLOG MIX 70/30) (70-30) 100 UNIT/ML injection Inject 0.42 mLs (42 Units total) into the skin 2 (two) times daily with  a meal. 03/06/18   McNew, Ileene Hutchinson, MD  insulin aspart protamine- aspart (NOVOLOG MIX 70/30) (70-30) 100 UNIT/ML injection Inject 0.42 mLs (42 Units total) into the skin 2 (two) times daily with a meal. 06/25/18 09/05/18  Menshew, Charlesetta Ivory, PA-C  lisinopril (PRINIVIL,ZESTRIL) 20 MG tablet Take 1 tablet (20 mg total) by mouth daily. 03/07/18   McNew, Ileene Hutchinson, MD  lisinopril (PRINIVIL,ZESTRIL) 20 MG tablet Take 1 tablet (20 mg total) by mouth daily. 06/25/18 08/24/18  Menshew, Charlesetta Ivory, PA-C  ondansetron (ZOFRAN ODT) 4 MG disintegrating tablet Take 1 tablet (4 mg total) by mouth every 8 (eight) hours as needed for nausea or vomiting. 05/08/18   Arnaldo Natal, MD  traZODone (DESYREL) 150 MG tablet Take 1 tablet (150 mg total) by mouth at bedtime as needed for sleep. Patient not taking: Reported on 05/08/2018 03/07/18   Haskell Riling, MD    Allergies Ultram Marcia Brash hcl]  No family history on file.  Social History Social History   Tobacco Use  . Smoking status: Current Every Day Smoker    Packs/day: 0.50    Types: Cigarettes  . Smokeless tobacco: Never Used  . Tobacco comment: 30+ years  Substance Use Topics  . Alcohol use: No    Comment: Hasn't had a drink since 1998  . Drug use: Not Currently    Comment: Previously abused pain pills. prescribed methadone     Review of Systems Constitutional: Positive for fever. Eyes: No visual changes. ENT: No sore throat. Cardiovascular: Denies chest pain. Respiratory: Denies shortness of breath. Gastrointestinal: Positive for abdominal pain, nausea and vomiting. Genitourinary: Positive for dysuria. Musculoskeletal: Negative for back pain. Skin: Negative for rash. Neurological: Negative for headaches, focal weakness or numbness.  ____________________________________________   PHYSICAL EXAM:  VITAL SIGNS: ED Triage Vitals  Enc Vitals Group     BP 07/07/18 1323 (!) 124/57     Pulse Rate 07/07/18 1323 87     Resp 07/07/18  1323 14     Temp 07/07/18 1323 98.7 F (37.1 C)     Temp Source 07/07/18 1323 Oral     SpO2 07/07/18 1323 94 %     Weight --      Height --      Head Circumference --      Peak Flow --      Pain Score 07/07/18 1330 7   Constitutional: Alert and oriented.  Eyes: Conjunctivae are normal.  ENT      Head: Normocephalic and atraumatic.      Nose: No congestion/rhinnorhea.      Mouth/Throat: Mucous membranes are moist.      Neck: No stridor. Hematological/Lymphatic/Immunilogical: No cervical lymphadenopathy. Cardiovascular: Normal rate, regular rhythm.  No murmurs, rubs, or gallops. Respiratory: Normal respiratory effort without tachypnea nor retractions. Breath sounds are clear and  equal bilaterally. No wheezes/rales/rhonchi. Gastrointestinal: Soft and non tender. No rebound. No guarding.  Genitourinary: Deferred Musculoskeletal: Normal range of motion in all extremities. No lower extremity edema. Neurologic:  Normal speech and language. No gross focal neurologic deficits are appreciated.  Skin:  Skin is warm, dry and intact. No rash noted. Psychiatric: Mood and affect are normal. Speech and behavior are normal. Patient exhibits appropriate insight and judgment.  ____________________________________________    LABS (pertinent positives/negatives)  Lipase 29 CMP na 123, cl 88, glu 648, cr 0.70, anion gap 10 CBC wbc 9.6, hgb 12.1, plt 239 CBG 316 ____________________________________________   EKG  None  ____________________________________________    RADIOLOGY  None  ____________________________________________   PROCEDURES  Procedures  ____________________________________________   INITIAL IMPRESSION / ASSESSMENT AND PLAN / ED COURSE  Pertinent labs & imaging results that were available during my care of the patient were reviewed by me and considered in my medical decision making (see chart for details).   Patient presented to the emergency department today  because of concerns for viral illness type symptoms.  Patient was found to have significantly elevated sugars.  At this point do not think patient is in DKA.  Patient was given IV fluids and insulin and did have good improvement of her sugars.  She was also been having possibility of urinary tract infection.  I discussed this with the patient.  Do wonder if this could be driving the patient's symptoms and elevation of blood sugar.  Patient was given dose of IV antibiotics here and will be given prescription for further abx.  ____________________________________________   FINAL CLINICAL IMPRESSION(S) / ED DIAGNOSES  Final diagnoses:  Hyperglycemia  Lower urinary tract infectious disease     Note: This dictation was prepared with Dragon dictation. Any transcriptional errors that result from this process are unintentional     Phineas Semen, MD 07/07/18 2206

## 2018-07-07 NOTE — Discharge Instructions (Addendum)
Please seek medical attention for any high fevers, chest pain, shortness of breath, change in behavior, persistent vomiting, bloody stool or any other new or concerning symptoms.  

## 2018-07-07 NOTE — ED Triage Notes (Addendum)
Pt comes via POV from home with c/o nausea and vomiting for about a week. Pt states possible flu because she has been around others with the FLU.  Pt states decreased food intake but has been able to intake orally.  Pt states she just hurts all over.

## 2018-07-10 LAB — URINE CULTURE: Culture: >=100000 — AB

## 2019-07-18 IMAGING — CR DG KNEE COMPLETE 4+V*R*
1 series · 4 of 4 positions shown · non-contrast
Comparison: 11/02/2016

CLINICAL DATA: Right knee pain after fall today.

EXAM:
RIGHT KNEE - COMPLETE 4+ VIEW

[Series 1: dg knee complete 4 views right · 0.14mm/px · 4 of 4 slices shown]
[im 1/4]
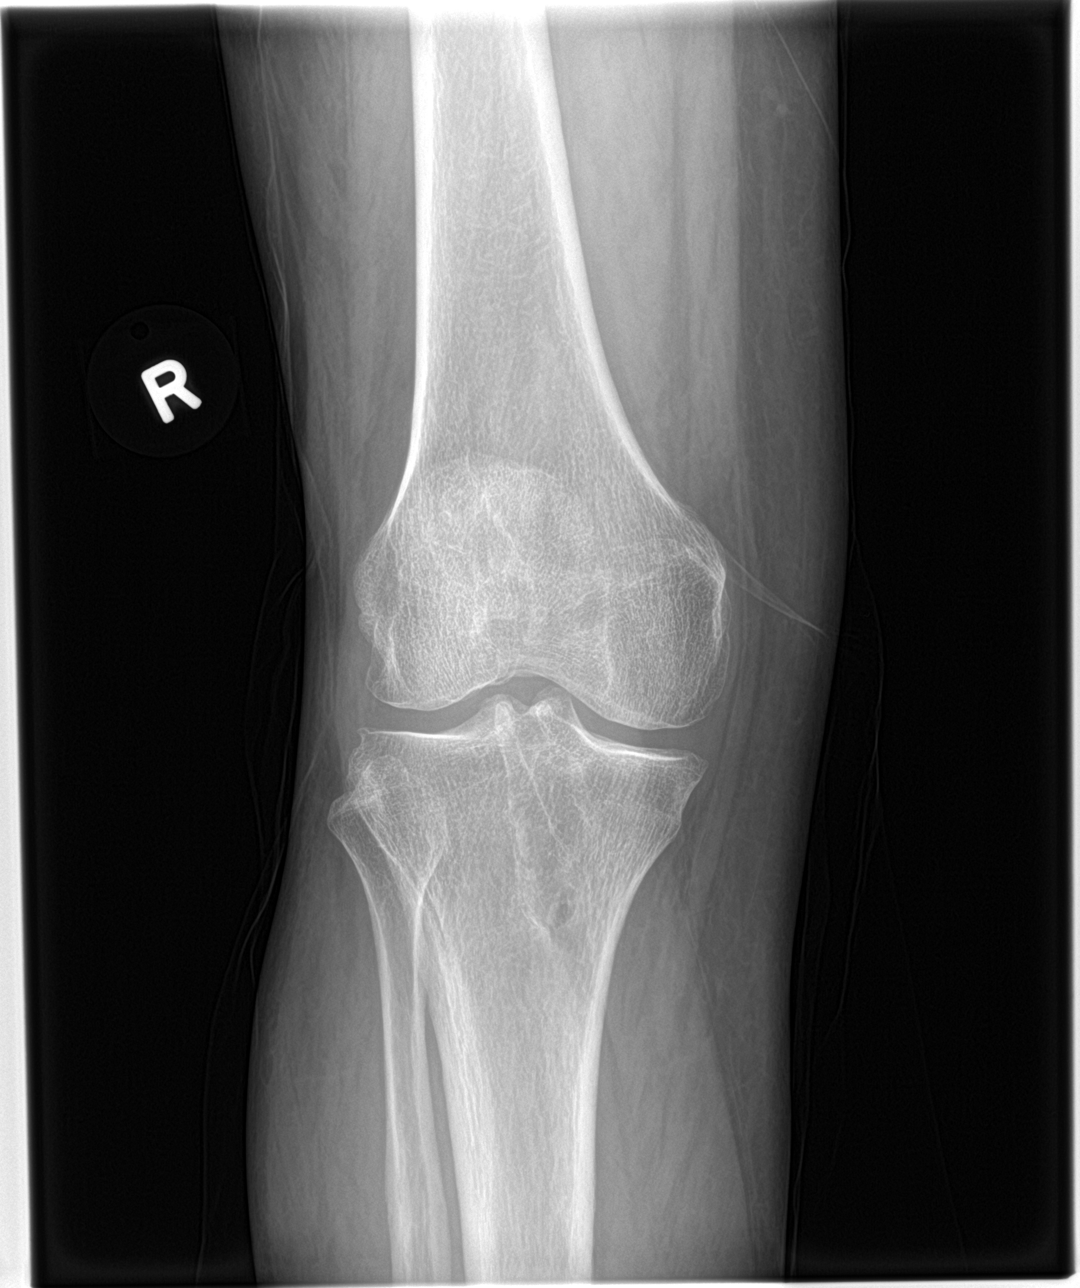
[im 2/4]
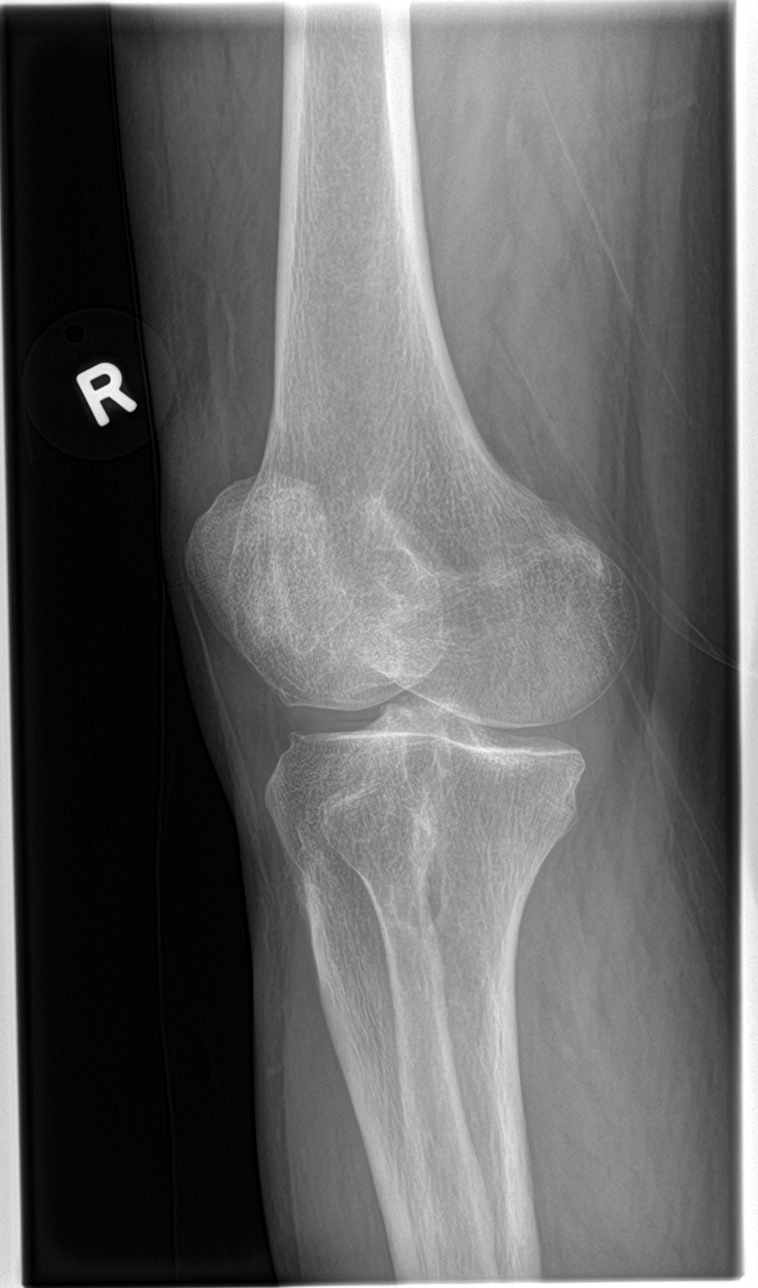
[im 3/4]
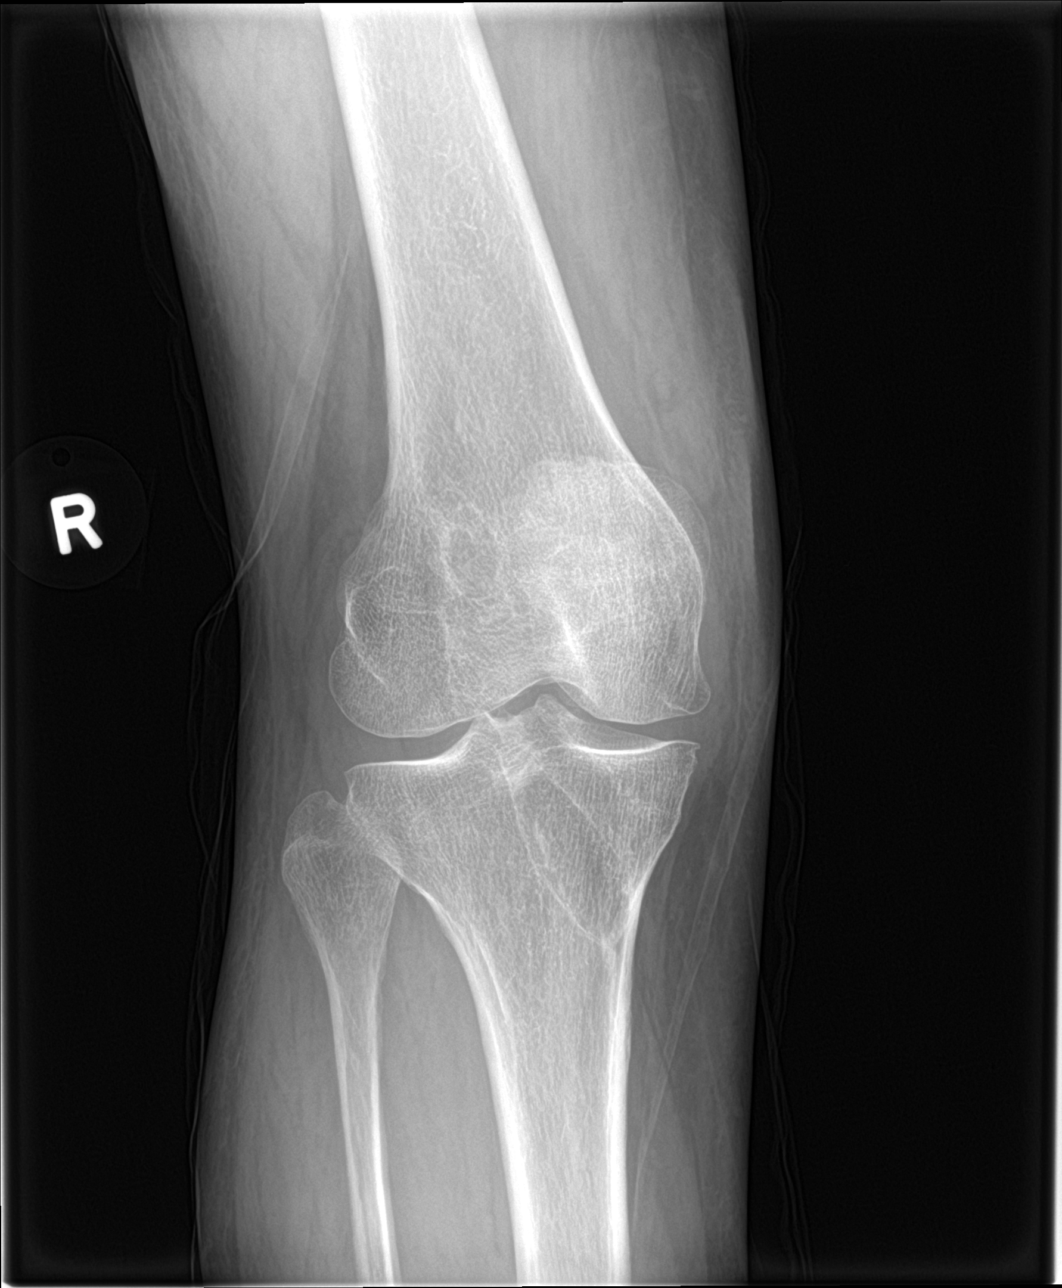
[im 4/4]
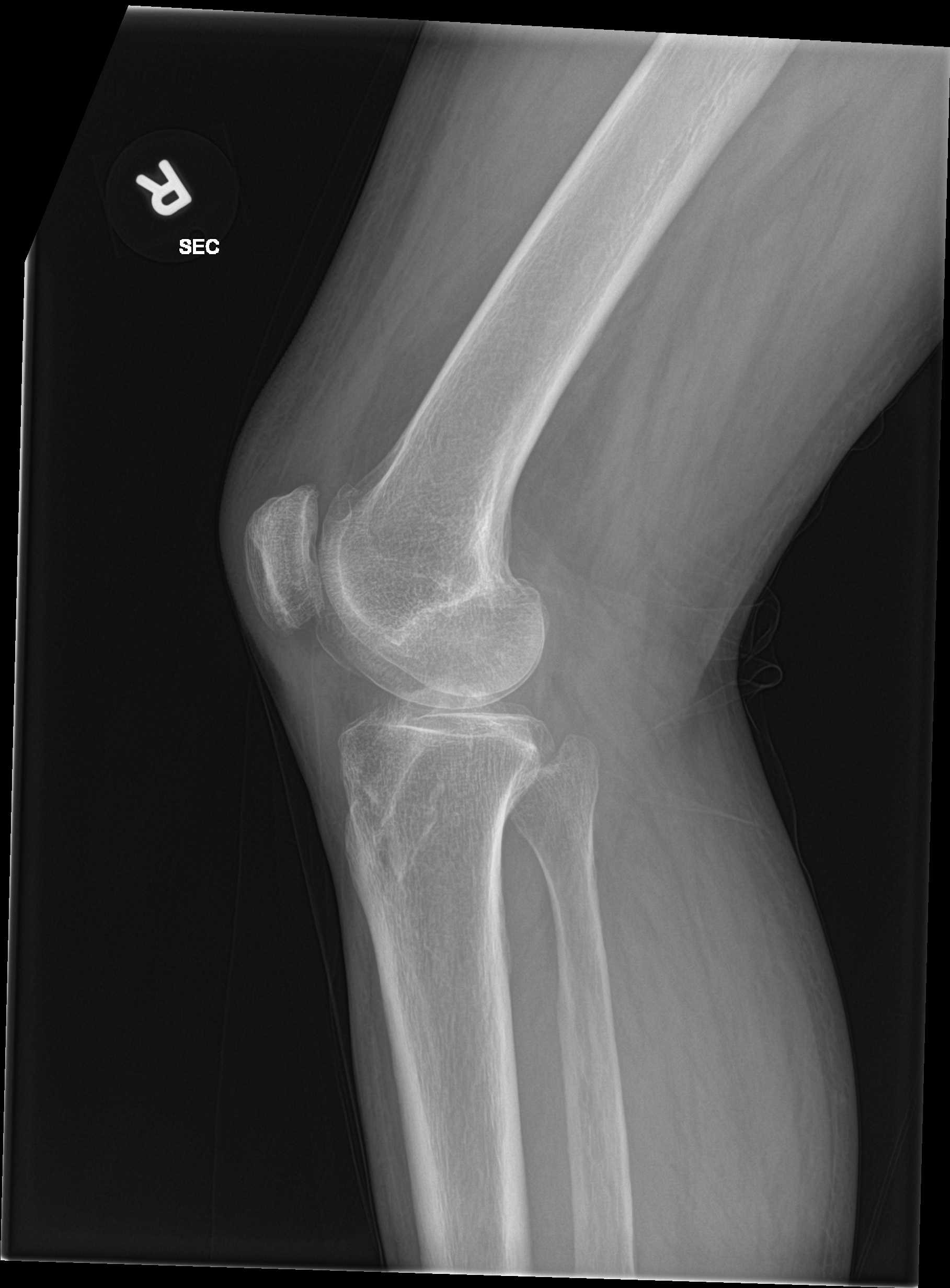

[4 of 4 positions shown; findings below may reference images not displayed]

FINDINGS: No evidence of acute fracture joint dislocation. Trace suprapatellar
joint effusion. Slight degenerate femorotibial joint space narrowing
with spurring of the tibial plateau. Status post ACL repair. Soft
tissues are unremarkable.
IMPRESSION: No evidence of acute fracture nor dislocation. Trace joint effusion.

## 2019-11-24 IMAGING — CR DG CHEST 2V
2 series · 2 of 2 positions shown · non-contrast
Comparison: 08/06/2017 prior radiographs

CLINICAL DATA: Acute chest pain.

EXAM:
CHEST - 2 VIEW

[chest pa]
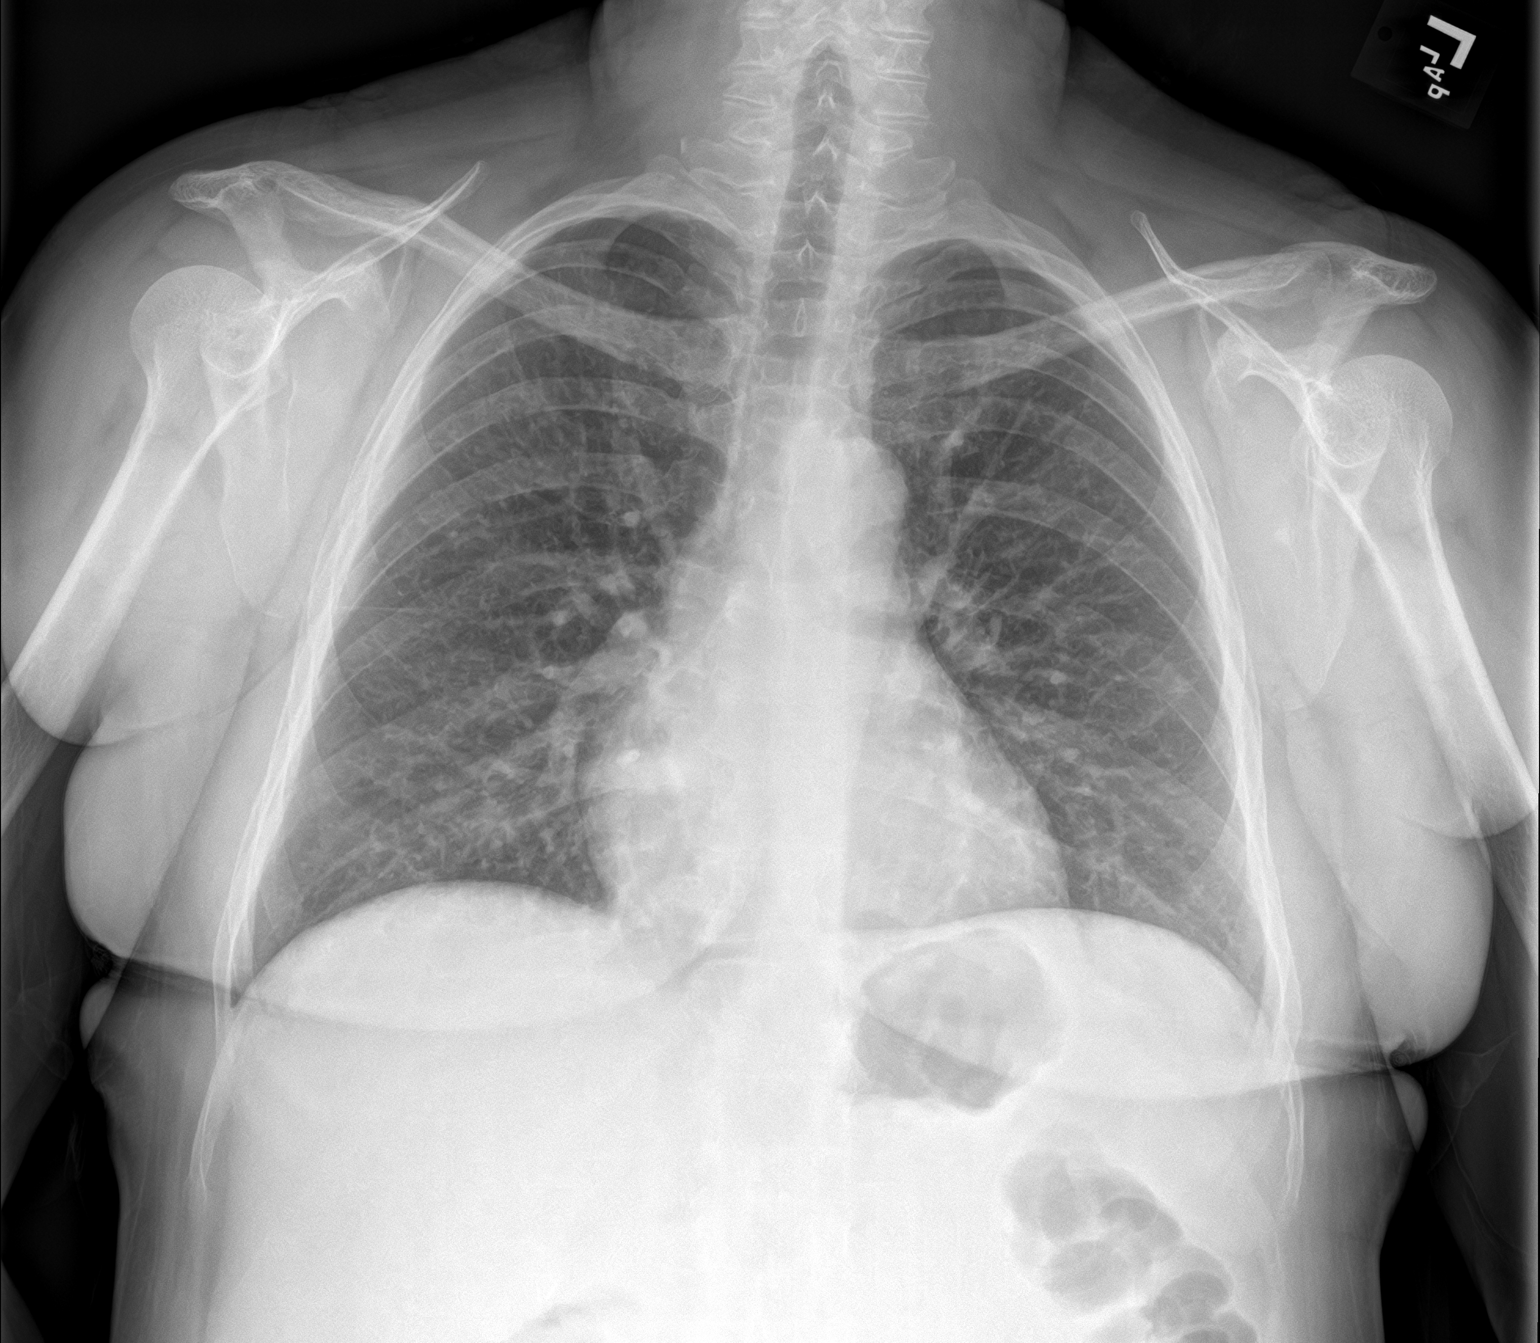

[chest lat]
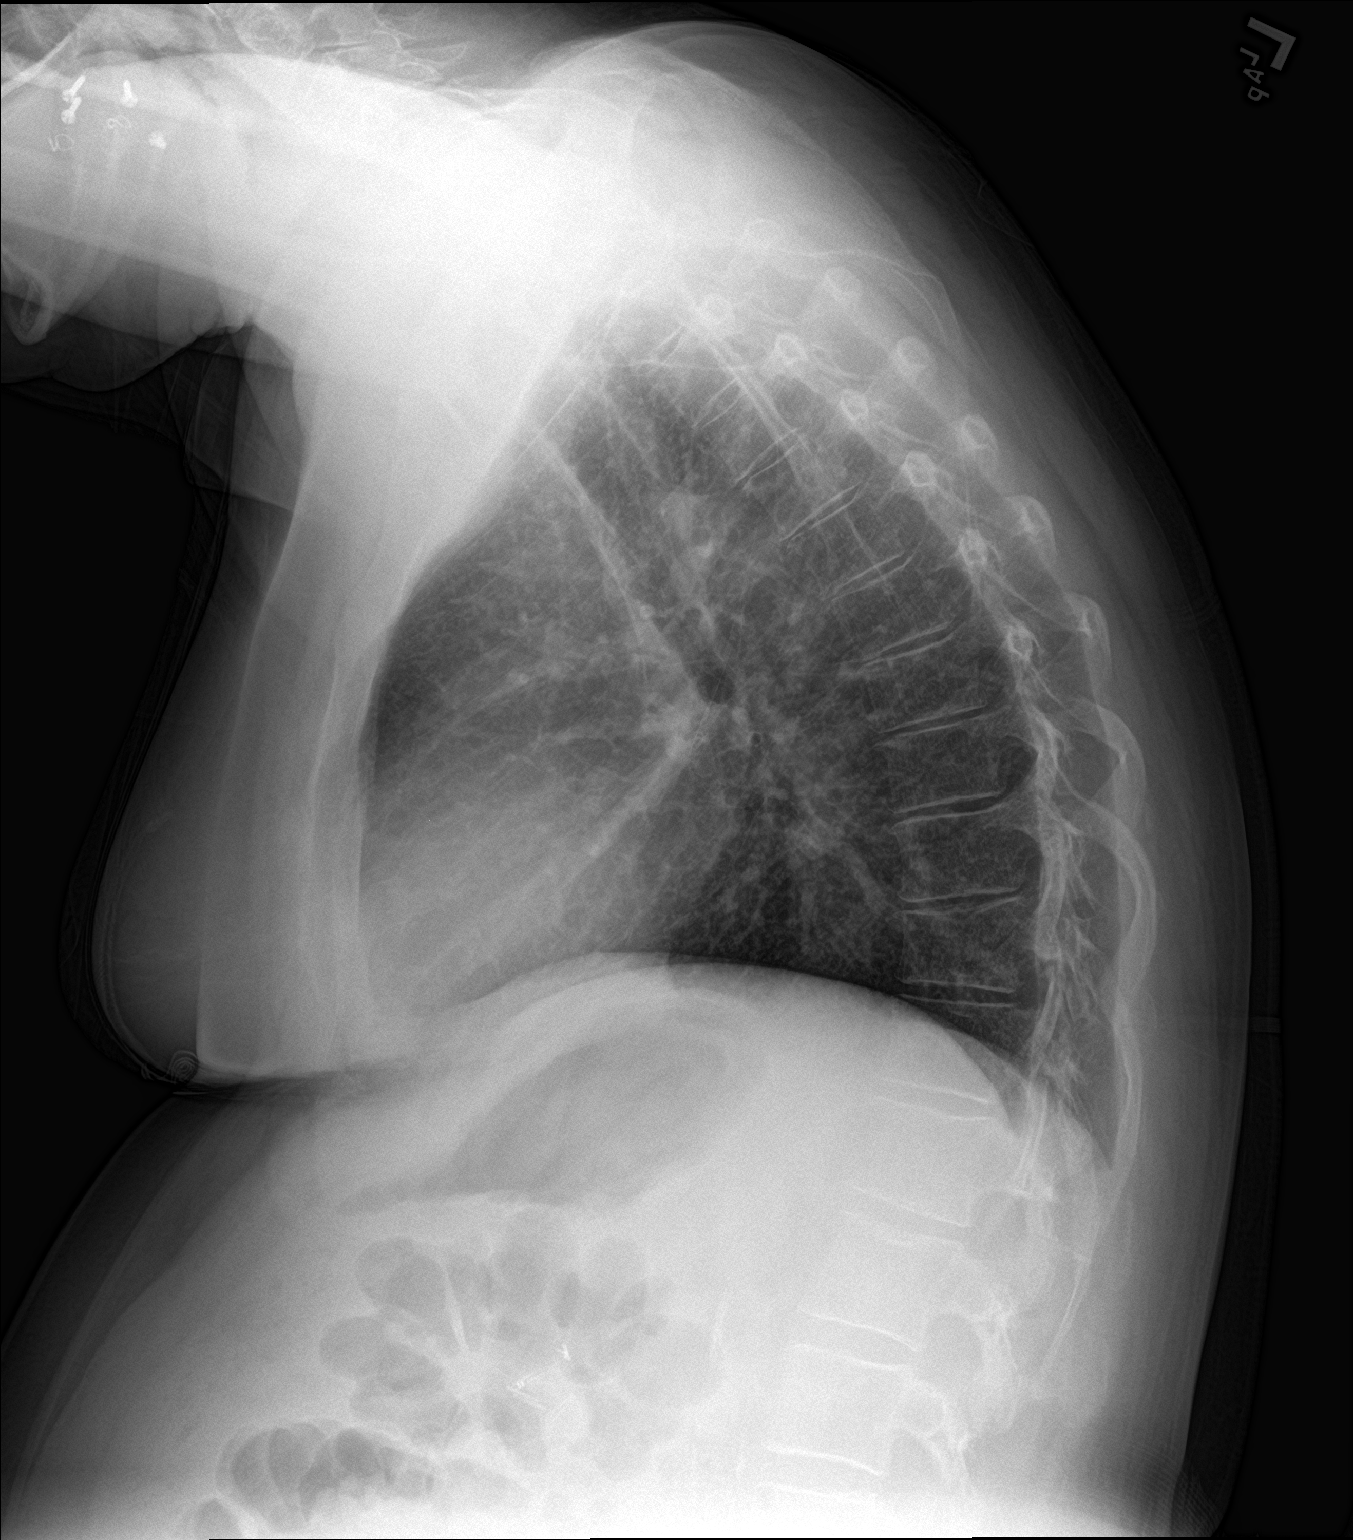

[2 of 2 positions shown; findings below may reference images not displayed]

FINDINGS: The cardiomediastinal silhouette is unremarkable.

Mild peribronchial thickening is unchanged.

There is no evidence of focal airspace disease, pulmonary edema,
suspicious pulmonary nodule/mass, pleural effusion, or pneumothorax.

No acute bony abnormalities are identified.
IMPRESSION: No active cardiopulmonary disease.

## 2020-03-30 IMAGING — CR DG CHEST 2V
2 series · 2 of 2 positions shown · non-contrast
Comparison: 01/08/2018

CLINICAL DATA: Patient reports onset of heart palpitations today.
Reports some SOB. Denies cough or fever. Hx DM, HTN,
cholecystectomy. Patient has previous hx of drug use and has been on
methadone, but has not taken it in the past 3 days. Current smoker.

EXAM:
CHEST - 2 VIEW

[chest pa]
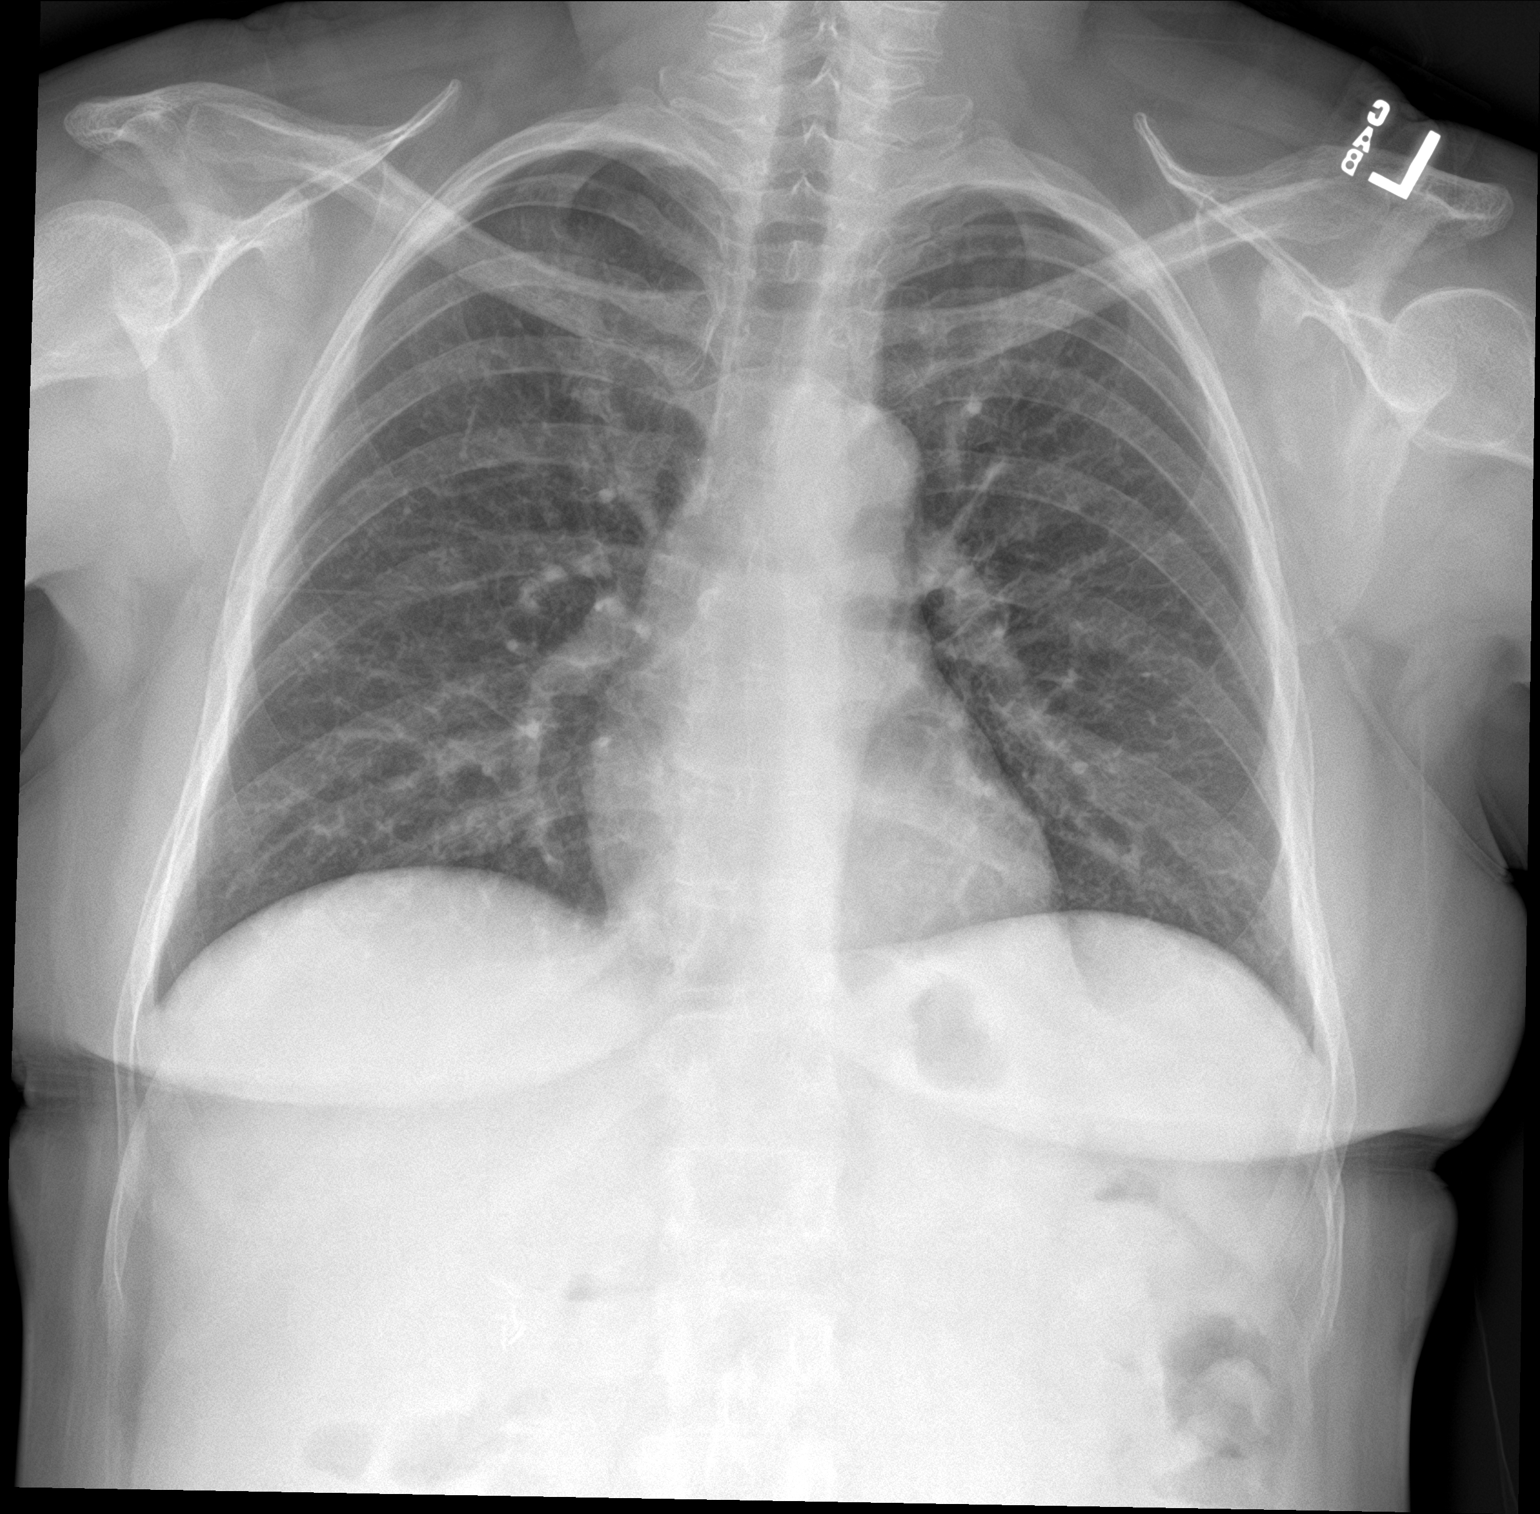

[chest lat]
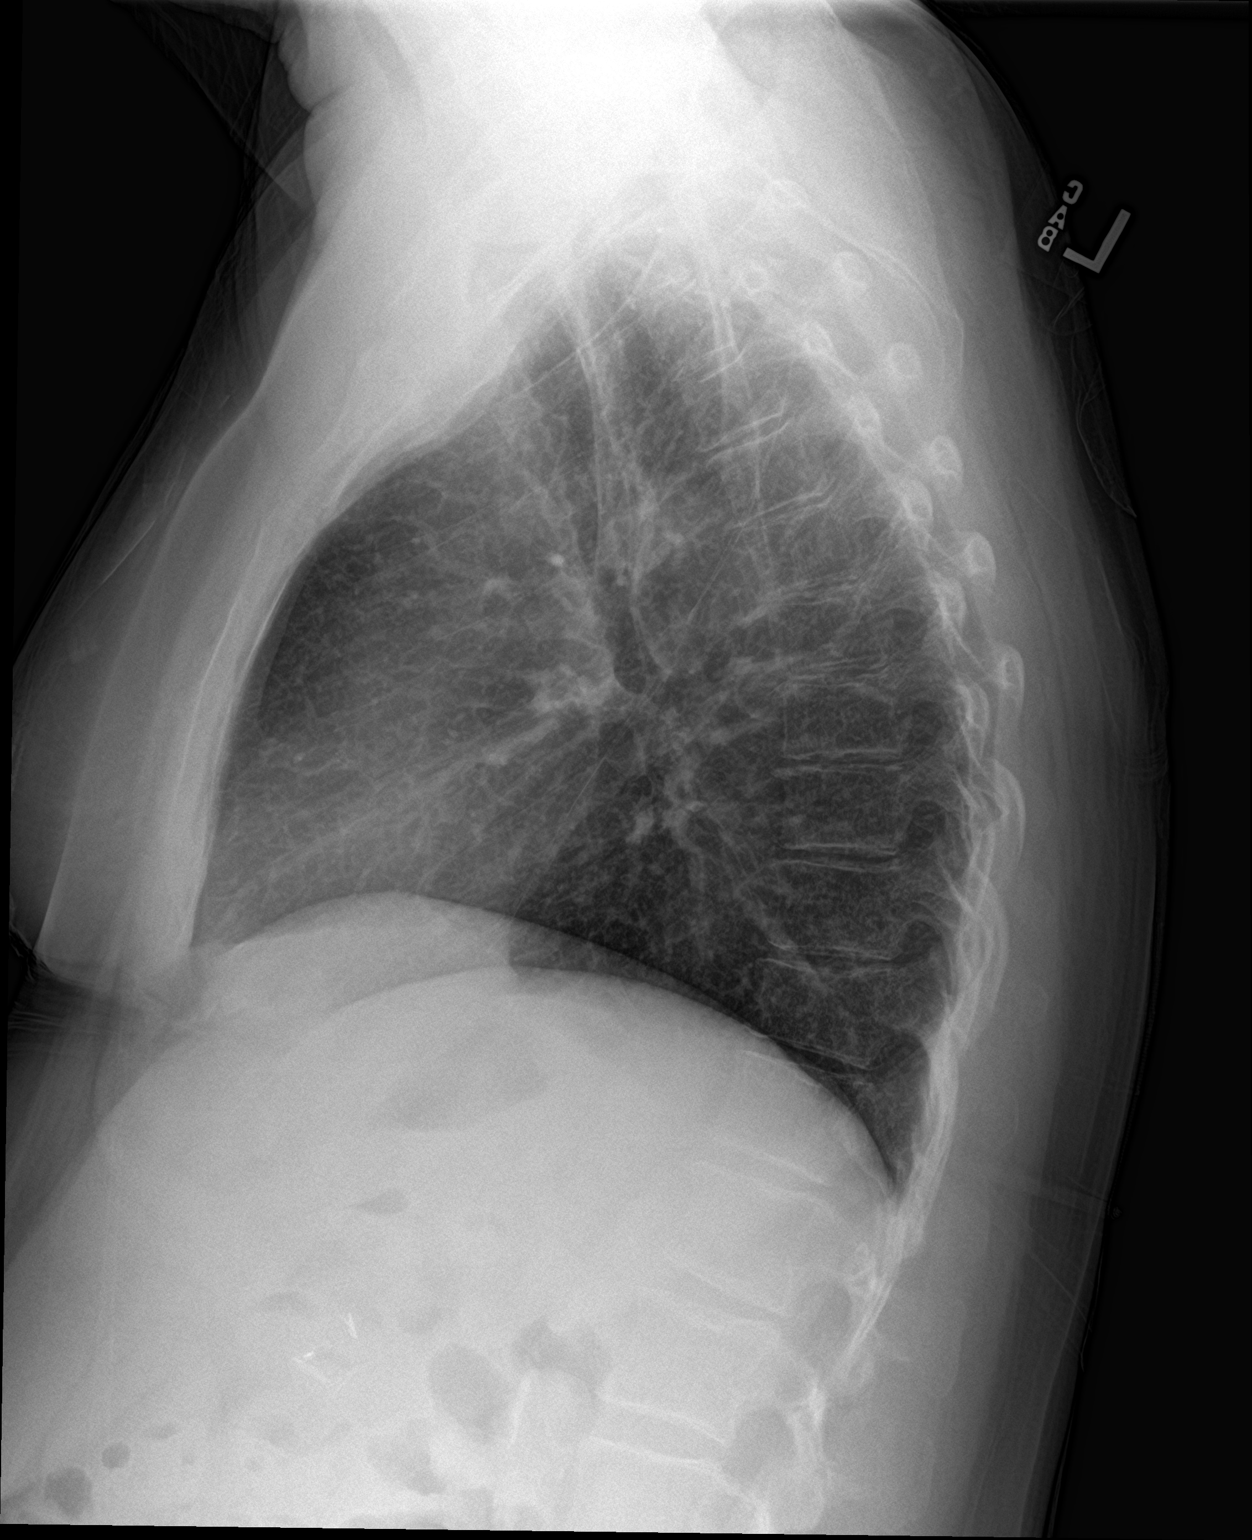

[2 of 2 positions shown; findings below may reference images not displayed]

FINDINGS: The heart size and mediastinal contours are within normal limits.
Both lungs are clear. No pleural effusion or pneumothorax. The
visualized skeletal structures are unremarkable.
IMPRESSION: No active cardiopulmonary disease.
